# Patient Record
Sex: Female | Born: 1955
Health system: Southern US, Community
[De-identification: ages and names within clinical notes are randomized; demographics above are authoritative.]

## PROBLEM LIST (undated history)

## (undated) DIAGNOSIS — M75100 Unspecified rotator cuff tear or rupture of unspecified shoulder, not specified as traumatic: Secondary | ICD-10-CM

## (undated) DIAGNOSIS — R4586 Emotional lability: Secondary | ICD-10-CM

## (undated) DIAGNOSIS — M1990 Unspecified osteoarthritis, unspecified site: Secondary | ICD-10-CM

## (undated) DIAGNOSIS — I10 Essential (primary) hypertension: Secondary | ICD-10-CM

## (undated) DIAGNOSIS — A599 Trichomoniasis, unspecified: Secondary | ICD-10-CM

## (undated) DIAGNOSIS — K029 Dental caries, unspecified: Secondary | ICD-10-CM

## (undated) DIAGNOSIS — F329 Major depressive disorder, single episode, unspecified: Secondary | ICD-10-CM

## (undated) DIAGNOSIS — M25552 Pain in left hip: Secondary | ICD-10-CM

## (undated) DIAGNOSIS — K430 Incisional hernia with obstruction, without gangrene: Secondary | ICD-10-CM

## (undated) DIAGNOSIS — M5030 Other cervical disc degeneration, unspecified cervical region: Secondary | ICD-10-CM

## (undated) DIAGNOSIS — J45909 Unspecified asthma, uncomplicated: Secondary | ICD-10-CM

## (undated) DIAGNOSIS — M47817 Spondylosis without myelopathy or radiculopathy, lumbosacral region: Secondary | ICD-10-CM

## (undated) DIAGNOSIS — M79609 Pain in unspecified limb: Secondary | ICD-10-CM

## (undated) DIAGNOSIS — N898 Other specified noninflammatory disorders of vagina: Secondary | ICD-10-CM

## (undated) DIAGNOSIS — R3129 Other microscopic hematuria: Secondary | ICD-10-CM

## (undated) DIAGNOSIS — Z8719 Personal history of other diseases of the digestive system: Secondary | ICD-10-CM

## (undated) DIAGNOSIS — M5430 Sciatica, unspecified side: Secondary | ICD-10-CM

## (undated) DIAGNOSIS — M940 Chondrocostal junction syndrome [Tietze]: Secondary | ICD-10-CM

## (undated) DIAGNOSIS — M5412 Radiculopathy, cervical region: Secondary | ICD-10-CM

## (undated) DIAGNOSIS — M479 Spondylosis, unspecified: Secondary | ICD-10-CM

## (undated) DIAGNOSIS — F17200 Nicotine dependence, unspecified, uncomplicated: Secondary | ICD-10-CM

## (undated) DIAGNOSIS — S92309A Fracture of unspecified metatarsal bone(s), unspecified foot, initial encounter for closed fracture: Secondary | ICD-10-CM

## (undated) DIAGNOSIS — M5126 Other intervertebral disc displacement, lumbar region: Secondary | ICD-10-CM

## (undated) DIAGNOSIS — F32A Depression, unspecified: Secondary | ICD-10-CM

## (undated) DIAGNOSIS — E785 Hyperlipidemia, unspecified: Secondary | ICD-10-CM

## (undated) DIAGNOSIS — M654 Radial styloid tenosynovitis [de Quervain]: Secondary | ICD-10-CM

## (undated) DIAGNOSIS — N200 Calculus of kidney: Secondary | ICD-10-CM

## (undated) DIAGNOSIS — M25539 Pain in unspecified wrist: Secondary | ICD-10-CM

## (undated) DIAGNOSIS — Z87442 Personal history of urinary calculi: Secondary | ICD-10-CM

## (undated) DIAGNOSIS — J309 Allergic rhinitis, unspecified: Secondary | ICD-10-CM

## (undated) DIAGNOSIS — E669 Obesity, unspecified: Secondary | ICD-10-CM

## (undated) DIAGNOSIS — Z981 Arthrodesis status: Secondary | ICD-10-CM

## (undated) DIAGNOSIS — R002 Palpitations: Secondary | ICD-10-CM

## (undated) HISTORY — DX: Dental caries, unspecified: K02.9

## (undated) HISTORY — DX: Chondrocostal junction syndrome (tietze): M94.0

## (undated) HISTORY — PX: HERNIA REPAIR: SHX51

## (undated) HISTORY — PX: TONSILLECTOMY: SUR1361

## (undated) HISTORY — PX: DILATION AND CURETTAGE OF UTERUS: SHX78

## (undated) HISTORY — DX: Unspecified asthma, uncomplicated: J45.909

## (undated) HISTORY — DX: Essential (primary) hypertension: I10

## (undated) HISTORY — DX: Other intervertebral disc displacement, lumbar region: M51.26

## (undated) HISTORY — DX: Hyperlipidemia, unspecified: E78.5

## (undated) HISTORY — DX: Fracture of unspecified metatarsal bone(s), unspecified foot, initial encounter for closed fracture: S92.309A

## (undated) HISTORY — PX: MULTIPLE TOOTH EXTRACTIONS: SHX2053

## (undated) HISTORY — DX: Calculus of kidney: N20.0

## (undated) HISTORY — DX: Unspecified osteoarthritis, unspecified site: M19.90

---
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
## 1977-03-31 HISTORY — PX: TUBAL LIGATION: SHX77

## 1987-04-01 HISTORY — PX: BLADDER REPAIR: SHX76

## 1987-04-01 HISTORY — PX: SMALL INTESTINE SURGERY: SHX150

## 1987-04-01 HISTORY — PX: ABDOMINAL HYSTERECTOMY: SHX81

## 1997-12-01 ENCOUNTER — Emergency Department (HOSPITAL_COMMUNITY): Admission: EM | Admit: 1997-12-01 | Discharge: 1997-12-01 | Payer: Self-pay | Admitting: Emergency Medicine

## 1997-12-01 ENCOUNTER — Encounter: Payer: Self-pay | Admitting: Emergency Medicine

## 1998-07-20 ENCOUNTER — Emergency Department (HOSPITAL_COMMUNITY): Admission: EM | Admit: 1998-07-20 | Discharge: 1998-07-20 | Payer: Self-pay | Admitting: Emergency Medicine

## 1998-08-09 ENCOUNTER — Ambulatory Visit (HOSPITAL_COMMUNITY): Admission: RE | Admit: 1998-08-09 | Discharge: 1998-08-10 | Payer: Self-pay | Admitting: Surgery

## 1999-07-31 ENCOUNTER — Emergency Department (HOSPITAL_COMMUNITY): Admission: EM | Admit: 1999-07-31 | Discharge: 1999-07-31 | Payer: Self-pay | Admitting: Emergency Medicine

## 1999-10-24 ENCOUNTER — Encounter: Payer: Self-pay | Admitting: Emergency Medicine

## 1999-10-24 ENCOUNTER — Emergency Department (HOSPITAL_COMMUNITY): Admission: EM | Admit: 1999-10-24 | Discharge: 1999-10-24 | Payer: Self-pay | Admitting: Emergency Medicine

## 1999-10-31 ENCOUNTER — Emergency Department (HOSPITAL_COMMUNITY): Admission: EM | Admit: 1999-10-31 | Discharge: 1999-10-31 | Payer: Self-pay | Admitting: Emergency Medicine

## 1999-11-11 ENCOUNTER — Encounter: Admission: RE | Admit: 1999-11-11 | Discharge: 1999-11-11 | Payer: Self-pay | Admitting: Internal Medicine

## 2001-10-06 ENCOUNTER — Emergency Department (HOSPITAL_COMMUNITY): Admission: EM | Admit: 2001-10-06 | Discharge: 2001-10-06 | Payer: Self-pay

## 2001-12-06 ENCOUNTER — Emergency Department (HOSPITAL_COMMUNITY): Admission: EM | Admit: 2001-12-06 | Discharge: 2001-12-06 | Payer: Self-pay | Admitting: Emergency Medicine

## 2003-04-01 HISTORY — PX: UMBILICAL HERNIA REPAIR: SHX196

## 2003-07-03 ENCOUNTER — Emergency Department (HOSPITAL_COMMUNITY): Admission: EM | Admit: 2003-07-03 | Discharge: 2003-07-03 | Payer: Self-pay

## 2003-07-04 ENCOUNTER — Ambulatory Visit (HOSPITAL_COMMUNITY): Admission: EM | Admit: 2003-07-04 | Discharge: 2003-07-04 | Payer: Self-pay | Admitting: Emergency Medicine

## 2003-09-08 ENCOUNTER — Emergency Department (HOSPITAL_COMMUNITY): Admission: EM | Admit: 2003-09-08 | Discharge: 2003-09-08 | Payer: Self-pay | Admitting: Emergency Medicine

## 2005-03-31 HISTORY — PX: VENTRAL HERNIA REPAIR: SHX424

## 2005-08-11 ENCOUNTER — Emergency Department (HOSPITAL_COMMUNITY): Admission: EM | Admit: 2005-08-11 | Discharge: 2005-08-11 | Payer: Self-pay | Admitting: Family Medicine

## 2005-08-14 ENCOUNTER — Ambulatory Visit (HOSPITAL_BASED_OUTPATIENT_CLINIC_OR_DEPARTMENT_OTHER): Admission: RE | Admit: 2005-08-14 | Discharge: 2005-08-14 | Payer: Self-pay | Admitting: General Surgery

## 2006-09-11 ENCOUNTER — Emergency Department (HOSPITAL_COMMUNITY): Admission: EM | Admit: 2006-09-11 | Discharge: 2006-09-11 | Payer: Self-pay | Admitting: Family Medicine

## 2006-10-12 ENCOUNTER — Emergency Department (HOSPITAL_COMMUNITY): Admission: EM | Admit: 2006-10-12 | Discharge: 2006-10-12 | Payer: Self-pay | Admitting: Family Medicine

## 2007-02-18 ENCOUNTER — Emergency Department (HOSPITAL_COMMUNITY): Admission: EM | Admit: 2007-02-18 | Discharge: 2007-02-18 | Payer: Self-pay | Admitting: Emergency Medicine

## 2007-03-16 ENCOUNTER — Ambulatory Visit: Payer: Self-pay | Admitting: Nurse Practitioner

## 2007-04-01 DIAGNOSIS — J45909 Unspecified asthma, uncomplicated: Secondary | ICD-10-CM

## 2007-04-01 DIAGNOSIS — I10 Essential (primary) hypertension: Secondary | ICD-10-CM

## 2007-04-01 HISTORY — DX: Essential (primary) hypertension: I10

## 2007-04-01 HISTORY — DX: Unspecified asthma, uncomplicated: J45.909

## 2007-04-06 ENCOUNTER — Telehealth (INDEPENDENT_AMBULATORY_CARE_PROVIDER_SITE_OTHER): Payer: Self-pay | Admitting: Nurse Practitioner

## 2007-04-15 ENCOUNTER — Ambulatory Visit: Payer: Self-pay | Admitting: Nurse Practitioner

## 2007-04-15 DIAGNOSIS — M79609 Pain in unspecified limb: Secondary | ICD-10-CM

## 2007-04-15 HISTORY — DX: Pain in unspecified limb: M79.609

## 2007-04-16 ENCOUNTER — Telehealth (INDEPENDENT_AMBULATORY_CARE_PROVIDER_SITE_OTHER): Payer: Self-pay | Admitting: Nurse Practitioner

## 2007-04-22 ENCOUNTER — Ambulatory Visit: Payer: Self-pay | Admitting: Nurse Practitioner

## 2007-04-22 DIAGNOSIS — A599 Trichomoniasis, unspecified: Secondary | ICD-10-CM

## 2007-04-22 DIAGNOSIS — R3129 Other microscopic hematuria: Secondary | ICD-10-CM | POA: Insufficient documentation

## 2007-04-22 HISTORY — DX: Other microscopic hematuria: R31.29

## 2007-04-22 HISTORY — DX: Trichomoniasis, unspecified: A59.9

## 2007-04-22 LAB — CONVERTED CEMR LAB
Bilirubin Urine: NEGATIVE
Glucose, Urine, Semiquant: NEGATIVE
Nitrite: NEGATIVE
Pap Smear: NEGATIVE
WBC Urine, dipstick: NEGATIVE
pH: 7.5

## 2007-04-23 ENCOUNTER — Encounter (INDEPENDENT_AMBULATORY_CARE_PROVIDER_SITE_OTHER): Payer: Self-pay | Admitting: Nurse Practitioner

## 2007-04-23 LAB — CONVERTED CEMR LAB
Hep B C IgM: NEGATIVE
Hepatitis B Surface Ag: NEGATIVE

## 2007-04-26 ENCOUNTER — Ambulatory Visit (HOSPITAL_COMMUNITY): Admission: RE | Admit: 2007-04-26 | Discharge: 2007-04-26 | Payer: Self-pay | Admitting: Internal Medicine

## 2007-04-29 ENCOUNTER — Encounter (INDEPENDENT_AMBULATORY_CARE_PROVIDER_SITE_OTHER): Payer: Self-pay | Admitting: Internal Medicine

## 2007-04-30 ENCOUNTER — Encounter (INDEPENDENT_AMBULATORY_CARE_PROVIDER_SITE_OTHER): Payer: Self-pay | Admitting: Nurse Practitioner

## 2007-04-30 LAB — CONVERTED CEMR LAB
AST: 61 units/L — ABNORMAL HIGH (ref 0–37)
Albumin: 3.9 g/dL (ref 3.5–5.2)
Alkaline Phosphatase: 113 units/L (ref 39–117)
BUN: 14 mg/dL (ref 6–23)
Basophils Absolute: 0 10*3/uL (ref 0.0–0.1)
Basophils Relative: 0 % (ref 0–1)
CO2: 24 meq/L (ref 19–32)
Calcium: 9.1 mg/dL (ref 8.4–10.5)
Eosinophils Absolute: 0.2 10*3/uL (ref 0.0–0.7)
GC Probe Amp, Genital: NEGATIVE
Hemoglobin: 13.4 g/dL (ref 12.0–15.0)
Lymphocytes Relative: 43 % (ref 12–46)
MCHC: 32.8 g/dL (ref 30.0–36.0)
Monocytes Absolute: 0.5 10*3/uL (ref 0.1–1.0)
Monocytes Relative: 7 % (ref 3–12)
Neutro Abs: 3.2 10*3/uL (ref 1.7–7.7)
RDW: 13 % (ref 11.5–15.5)
TSH: 1.575 microintl units/mL (ref 0.350–5.50)
Total Bilirubin: 0.4 mg/dL (ref 0.3–1.2)
Total Protein: 6.8 g/dL (ref 6.0–8.3)

## 2007-05-07 ENCOUNTER — Ambulatory Visit: Payer: Self-pay | Admitting: Nurse Practitioner

## 2007-05-07 LAB — CONVERTED CEMR LAB
Blood in Urine, dipstick: NEGATIVE
Glucose, Urine, Semiquant: NEGATIVE
HDL: 46 mg/dL (ref >39)
LDL Cholesterol: 114 mg/dL — ABNORMAL HIGH (ref 0–99)
Protein, U semiquant: NEGATIVE
Total CHOL/HDL Ratio: 4.1
Urobilinogen, UA: NEGATIVE
VLDL: 27 mg/dL (ref 0–40)
WBC Urine, dipstick: NEGATIVE
pH: 6

## 2007-05-11 ENCOUNTER — Telehealth (INDEPENDENT_AMBULATORY_CARE_PROVIDER_SITE_OTHER): Payer: Self-pay | Admitting: Internal Medicine

## 2007-05-11 ENCOUNTER — Ambulatory Visit (HOSPITAL_COMMUNITY): Admission: RE | Admit: 2007-05-11 | Discharge: 2007-05-11 | Payer: Self-pay | Admitting: Internal Medicine

## 2007-05-21 ENCOUNTER — Ambulatory Visit: Payer: Self-pay | Admitting: Nurse Practitioner

## 2007-05-21 DIAGNOSIS — K430 Incisional hernia with obstruction, without gangrene: Secondary | ICD-10-CM

## 2007-05-21 DIAGNOSIS — N200 Calculus of kidney: Secondary | ICD-10-CM

## 2007-05-21 HISTORY — DX: Calculus of kidney: N20.0

## 2007-05-21 HISTORY — DX: Incisional hernia with obstruction, without gangrene: K43.0

## 2007-06-03 ENCOUNTER — Ambulatory Visit: Payer: Self-pay | Admitting: Nurse Practitioner

## 2007-09-27 ENCOUNTER — Telehealth (INDEPENDENT_AMBULATORY_CARE_PROVIDER_SITE_OTHER): Payer: Self-pay | Admitting: *Deleted

## 2007-10-11 ENCOUNTER — Ambulatory Visit: Payer: Self-pay | Admitting: *Deleted

## 2007-10-14 ENCOUNTER — Ambulatory Visit: Payer: Self-pay | Admitting: Nurse Practitioner

## 2007-10-18 ENCOUNTER — Ambulatory Visit (HOSPITAL_COMMUNITY): Admission: RE | Admit: 2007-10-18 | Discharge: 2007-10-18 | Payer: Self-pay | Admitting: Nurse Practitioner

## 2007-10-18 DIAGNOSIS — M5030 Other cervical disc degeneration, unspecified cervical region: Secondary | ICD-10-CM

## 2007-10-18 HISTORY — DX: Other cervical disc degeneration, unspecified cervical region: M50.30

## 2007-10-29 ENCOUNTER — Ambulatory Visit: Payer: Self-pay | Admitting: Nurse Practitioner

## 2007-10-30 ENCOUNTER — Ambulatory Visit (HOSPITAL_COMMUNITY): Admission: RE | Admit: 2007-10-30 | Discharge: 2007-10-30 | Payer: Self-pay | Admitting: Internal Medicine

## 2007-10-30 DIAGNOSIS — M75100 Unspecified rotator cuff tear or rupture of unspecified shoulder, not specified as traumatic: Secondary | ICD-10-CM

## 2007-10-30 DIAGNOSIS — IMO0002 Reserved for concepts with insufficient information to code with codable children: Secondary | ICD-10-CM

## 2007-10-30 HISTORY — DX: Unspecified rotator cuff tear or rupture of unspecified shoulder, not specified as traumatic: M75.100

## 2007-10-30 HISTORY — DX: Reserved for concepts with insufficient information to code with codable children: IMO0002

## 2007-11-11 ENCOUNTER — Ambulatory Visit: Payer: Self-pay | Admitting: Nurse Practitioner

## 2007-11-23 ENCOUNTER — Encounter: Admission: RE | Admit: 2007-11-23 | Discharge: 2007-12-24 | Payer: Self-pay | Admitting: Nurse Practitioner

## 2007-11-23 ENCOUNTER — Encounter (INDEPENDENT_AMBULATORY_CARE_PROVIDER_SITE_OTHER): Payer: Self-pay | Admitting: Nurse Practitioner

## 2007-11-24 ENCOUNTER — Telehealth (INDEPENDENT_AMBULATORY_CARE_PROVIDER_SITE_OTHER): Payer: Self-pay | Admitting: Nurse Practitioner

## 2007-11-26 ENCOUNTER — Telehealth (INDEPENDENT_AMBULATORY_CARE_PROVIDER_SITE_OTHER): Payer: Self-pay | Admitting: Nurse Practitioner

## 2007-11-26 ENCOUNTER — Encounter (INDEPENDENT_AMBULATORY_CARE_PROVIDER_SITE_OTHER): Payer: Self-pay | Admitting: Nurse Practitioner

## 2007-12-01 ENCOUNTER — Emergency Department (HOSPITAL_COMMUNITY): Admission: EM | Admit: 2007-12-01 | Discharge: 2007-12-01 | Payer: Self-pay | Admitting: Family Medicine

## 2007-12-02 ENCOUNTER — Telehealth (INDEPENDENT_AMBULATORY_CARE_PROVIDER_SITE_OTHER): Payer: Self-pay | Admitting: Nurse Practitioner

## 2007-12-03 ENCOUNTER — Encounter (INDEPENDENT_AMBULATORY_CARE_PROVIDER_SITE_OTHER): Payer: Self-pay | Admitting: *Deleted

## 2007-12-03 ENCOUNTER — Emergency Department (HOSPITAL_COMMUNITY): Admission: EM | Admit: 2007-12-03 | Discharge: 2007-12-03 | Payer: Self-pay | Admitting: Family Medicine

## 2007-12-07 ENCOUNTER — Emergency Department (HOSPITAL_COMMUNITY): Admission: EM | Admit: 2007-12-07 | Discharge: 2007-12-07 | Payer: Self-pay | Admitting: Emergency Medicine

## 2007-12-09 ENCOUNTER — Emergency Department (HOSPITAL_COMMUNITY): Admission: EM | Admit: 2007-12-09 | Discharge: 2007-12-09 | Payer: Self-pay | Admitting: Emergency Medicine

## 2007-12-27 ENCOUNTER — Encounter (INDEPENDENT_AMBULATORY_CARE_PROVIDER_SITE_OTHER): Payer: Self-pay | Admitting: *Deleted

## 2008-01-20 ENCOUNTER — Telehealth (INDEPENDENT_AMBULATORY_CARE_PROVIDER_SITE_OTHER): Payer: Self-pay | Admitting: Nurse Practitioner

## 2008-02-04 ENCOUNTER — Ambulatory Visit: Payer: Self-pay | Admitting: Nurse Practitioner

## 2008-02-04 DIAGNOSIS — K029 Dental caries, unspecified: Secondary | ICD-10-CM

## 2008-02-04 HISTORY — DX: Dental caries, unspecified: K02.9

## 2008-02-08 ENCOUNTER — Ambulatory Visit (HOSPITAL_COMMUNITY): Admission: RE | Admit: 2008-02-08 | Discharge: 2008-02-08 | Payer: Self-pay | Admitting: Internal Medicine

## 2008-02-09 ENCOUNTER — Encounter (INDEPENDENT_AMBULATORY_CARE_PROVIDER_SITE_OTHER): Payer: Self-pay | Admitting: Nurse Practitioner

## 2008-02-09 DIAGNOSIS — M479 Spondylosis, unspecified: Secondary | ICD-10-CM | POA: Insufficient documentation

## 2008-02-09 HISTORY — DX: Spondylosis, unspecified: M47.9

## 2008-03-20 ENCOUNTER — Telehealth (INDEPENDENT_AMBULATORY_CARE_PROVIDER_SITE_OTHER): Payer: Self-pay | Admitting: *Deleted

## 2008-04-12 ENCOUNTER — Telehealth (INDEPENDENT_AMBULATORY_CARE_PROVIDER_SITE_OTHER): Payer: Self-pay | Admitting: Nurse Practitioner

## 2008-04-28 ENCOUNTER — Encounter (INDEPENDENT_AMBULATORY_CARE_PROVIDER_SITE_OTHER): Payer: Self-pay | Admitting: Nurse Practitioner

## 2008-05-02 ENCOUNTER — Ambulatory Visit (HOSPITAL_COMMUNITY): Admission: RE | Admit: 2008-05-02 | Discharge: 2008-05-02 | Payer: Self-pay | Admitting: Internal Medicine

## 2008-05-02 ENCOUNTER — Telehealth (INDEPENDENT_AMBULATORY_CARE_PROVIDER_SITE_OTHER): Payer: Self-pay | Admitting: Nurse Practitioner

## 2008-05-02 DIAGNOSIS — M5126 Other intervertebral disc displacement, lumbar region: Secondary | ICD-10-CM

## 2008-05-02 HISTORY — DX: Other intervertebral disc displacement, lumbar region: M51.26

## 2008-05-08 ENCOUNTER — Encounter (INDEPENDENT_AMBULATORY_CARE_PROVIDER_SITE_OTHER): Payer: Self-pay | Admitting: Nurse Practitioner

## 2008-05-09 ENCOUNTER — Encounter (INDEPENDENT_AMBULATORY_CARE_PROVIDER_SITE_OTHER): Payer: Self-pay | Admitting: Nurse Practitioner

## 2008-05-09 ENCOUNTER — Telehealth (INDEPENDENT_AMBULATORY_CARE_PROVIDER_SITE_OTHER): Payer: Self-pay | Admitting: Nurse Practitioner

## 2008-05-16 ENCOUNTER — Telehealth (INDEPENDENT_AMBULATORY_CARE_PROVIDER_SITE_OTHER): Payer: Self-pay | Admitting: Nurse Practitioner

## 2008-06-01 ENCOUNTER — Encounter (INDEPENDENT_AMBULATORY_CARE_PROVIDER_SITE_OTHER): Payer: Self-pay | Admitting: Nurse Practitioner

## 2008-06-02 ENCOUNTER — Encounter (INDEPENDENT_AMBULATORY_CARE_PROVIDER_SITE_OTHER): Payer: Self-pay | Admitting: Nurse Practitioner

## 2008-06-12 ENCOUNTER — Encounter (INDEPENDENT_AMBULATORY_CARE_PROVIDER_SITE_OTHER): Payer: Self-pay | Admitting: Nurse Practitioner

## 2008-06-22 ENCOUNTER — Telehealth (INDEPENDENT_AMBULATORY_CARE_PROVIDER_SITE_OTHER): Payer: Self-pay | Admitting: Nurse Practitioner

## 2008-07-03 ENCOUNTER — Encounter (INDEPENDENT_AMBULATORY_CARE_PROVIDER_SITE_OTHER): Payer: Self-pay | Admitting: Nurse Practitioner

## 2008-07-10 ENCOUNTER — Encounter (INDEPENDENT_AMBULATORY_CARE_PROVIDER_SITE_OTHER): Payer: Self-pay | Admitting: Nurse Practitioner

## 2008-07-27 ENCOUNTER — Telehealth (INDEPENDENT_AMBULATORY_CARE_PROVIDER_SITE_OTHER): Payer: Self-pay | Admitting: Nurse Practitioner

## 2008-08-25 ENCOUNTER — Encounter (INDEPENDENT_AMBULATORY_CARE_PROVIDER_SITE_OTHER): Payer: Self-pay | Admitting: Nurse Practitioner

## 2008-09-01 ENCOUNTER — Ambulatory Visit: Payer: Self-pay | Admitting: *Deleted

## 2008-09-01 ENCOUNTER — Observation Stay (HOSPITAL_COMMUNITY): Admission: EM | Admit: 2008-09-01 | Discharge: 2008-09-02 | Payer: Self-pay | Admitting: Emergency Medicine

## 2008-09-01 LAB — CONVERTED CEMR LAB
CO2: 22 meq/L
Chloride: 106 meq/L
RBC: 3.93 M/uL
Total Protein: 6.7 g/dL

## 2008-09-04 ENCOUNTER — Ambulatory Visit: Payer: Self-pay | Admitting: Nurse Practitioner

## 2008-09-04 DIAGNOSIS — R079 Chest pain, unspecified: Secondary | ICD-10-CM | POA: Insufficient documentation

## 2008-09-04 DIAGNOSIS — K5900 Constipation, unspecified: Secondary | ICD-10-CM | POA: Insufficient documentation

## 2008-09-04 DIAGNOSIS — M940 Chondrocostal junction syndrome [Tietze]: Secondary | ICD-10-CM

## 2008-09-04 HISTORY — DX: Chondrocostal junction syndrome (tietze): M94.0

## 2008-09-07 ENCOUNTER — Encounter (INDEPENDENT_AMBULATORY_CARE_PROVIDER_SITE_OTHER): Payer: Self-pay | Admitting: Nurse Practitioner

## 2008-09-11 ENCOUNTER — Encounter (INDEPENDENT_AMBULATORY_CARE_PROVIDER_SITE_OTHER): Payer: Self-pay | Admitting: Nurse Practitioner

## 2008-09-15 ENCOUNTER — Telehealth (INDEPENDENT_AMBULATORY_CARE_PROVIDER_SITE_OTHER): Payer: Self-pay | Admitting: Nurse Practitioner

## 2008-10-16 ENCOUNTER — Telehealth (INDEPENDENT_AMBULATORY_CARE_PROVIDER_SITE_OTHER): Payer: Self-pay | Admitting: Nurse Practitioner

## 2008-10-31 ENCOUNTER — Encounter (INDEPENDENT_AMBULATORY_CARE_PROVIDER_SITE_OTHER): Payer: Self-pay | Admitting: Nurse Practitioner

## 2008-11-20 ENCOUNTER — Telehealth (INDEPENDENT_AMBULATORY_CARE_PROVIDER_SITE_OTHER): Payer: Self-pay | Admitting: Nurse Practitioner

## 2008-11-27 ENCOUNTER — Telehealth (INDEPENDENT_AMBULATORY_CARE_PROVIDER_SITE_OTHER): Payer: Self-pay | Admitting: Nurse Practitioner

## 2008-12-07 ENCOUNTER — Telehealth (INDEPENDENT_AMBULATORY_CARE_PROVIDER_SITE_OTHER): Payer: Self-pay | Admitting: Nurse Practitioner

## 2008-12-29 ENCOUNTER — Ambulatory Visit: Payer: Self-pay | Admitting: Nurse Practitioner

## 2008-12-29 DIAGNOSIS — S92309A Fracture of unspecified metatarsal bone(s), unspecified foot, initial encounter for closed fracture: Secondary | ICD-10-CM | POA: Insufficient documentation

## 2008-12-29 HISTORY — DX: Fracture of unspecified metatarsal bone(s), unspecified foot, initial encounter for closed fracture: S92.309A

## 2008-12-29 LAB — CONVERTED CEMR LAB
ALT: 12 units/L (ref 0–35)
AST: 11 units/L (ref 0–37)
BUN: 19 mg/dL (ref 6–23)
Basophils Absolute: 0 10*3/uL (ref 0.0–0.1)
CO2: 22 meq/L (ref 19–32)
Calcium: 9.2 mg/dL (ref 8.4–10.5)
Creatinine, Ser: 0.72 mg/dL (ref 0.40–1.20)
Eosinophils Absolute: 0.2 10*3/uL (ref 0.0–0.7)
Eosinophils Relative: 3 % (ref 0–5)
Glucose, Bld: 73 mg/dL (ref 70–99)
HCT: 40.8 % (ref 36.0–46.0)
Hemoglobin: 13.1 g/dL (ref 12.0–15.0)
Lymphocytes Relative: 45 % (ref 12–46)
Lymphs Abs: 3.3 10*3/uL (ref 0.7–4.0)
Monocytes Absolute: 0.5 10*3/uL (ref 0.1–1.0)
Neutro Abs: 3.2 10*3/uL (ref 1.7–7.7)
Platelets: 404 10*3/uL — ABNORMAL HIGH (ref 150–400)
Potassium: 3.9 meq/L (ref 3.5–5.3)
RBC: 3.98 M/uL (ref 3.87–5.11)
RDW: 12.8 % (ref 11.5–15.5)
Total Bilirubin: 0.3 mg/dL (ref 0.3–1.2)
Total Protein: 6.1 g/dL (ref 6.0–8.3)

## 2008-12-30 ENCOUNTER — Emergency Department (HOSPITAL_COMMUNITY): Admission: EM | Admit: 2008-12-30 | Discharge: 2008-12-30 | Payer: Self-pay | Admitting: Family Medicine

## 2009-01-01 ENCOUNTER — Telehealth (INDEPENDENT_AMBULATORY_CARE_PROVIDER_SITE_OTHER): Payer: Self-pay | Admitting: Nurse Practitioner

## 2009-01-01 ENCOUNTER — Encounter (INDEPENDENT_AMBULATORY_CARE_PROVIDER_SITE_OTHER): Payer: Self-pay | Admitting: Nurse Practitioner

## 2009-01-05 ENCOUNTER — Ambulatory Visit: Payer: Self-pay | Admitting: Nurse Practitioner

## 2009-01-08 ENCOUNTER — Telehealth (INDEPENDENT_AMBULATORY_CARE_PROVIDER_SITE_OTHER): Payer: Self-pay | Admitting: *Deleted

## 2009-01-10 ENCOUNTER — Encounter (INDEPENDENT_AMBULATORY_CARE_PROVIDER_SITE_OTHER): Payer: Self-pay | Admitting: Nurse Practitioner

## 2009-01-19 ENCOUNTER — Encounter (INDEPENDENT_AMBULATORY_CARE_PROVIDER_SITE_OTHER): Payer: Self-pay | Admitting: Nurse Practitioner

## 2009-01-29 ENCOUNTER — Telehealth (INDEPENDENT_AMBULATORY_CARE_PROVIDER_SITE_OTHER): Payer: Self-pay | Admitting: Nurse Practitioner

## 2009-01-29 ENCOUNTER — Ambulatory Visit (HOSPITAL_COMMUNITY): Admission: RE | Admit: 2009-01-29 | Discharge: 2009-01-29 | Payer: Self-pay | Admitting: Nurse Practitioner

## 2009-01-29 ENCOUNTER — Ambulatory Visit: Payer: Self-pay | Admitting: Nurse Practitioner

## 2009-01-30 ENCOUNTER — Telehealth (INDEPENDENT_AMBULATORY_CARE_PROVIDER_SITE_OTHER): Payer: Self-pay | Admitting: Nurse Practitioner

## 2009-01-30 ENCOUNTER — Encounter: Admission: RE | Admit: 2009-01-30 | Discharge: 2009-02-20 | Payer: Self-pay | Admitting: Nurse Practitioner

## 2009-02-01 ENCOUNTER — Encounter (INDEPENDENT_AMBULATORY_CARE_PROVIDER_SITE_OTHER): Payer: Self-pay | Admitting: Nurse Practitioner

## 2009-02-04 IMAGING — CR DG CERVICAL SPINE COMPLETE 4+V
8 series · 8 of 8 positions shown · non-contrast
Comparison: 02/18/2007

CLINICAL DATA: Neck pain.

CERVICAL SPINE - COMPLETE 4+ VIEW

[w c-spine lat]
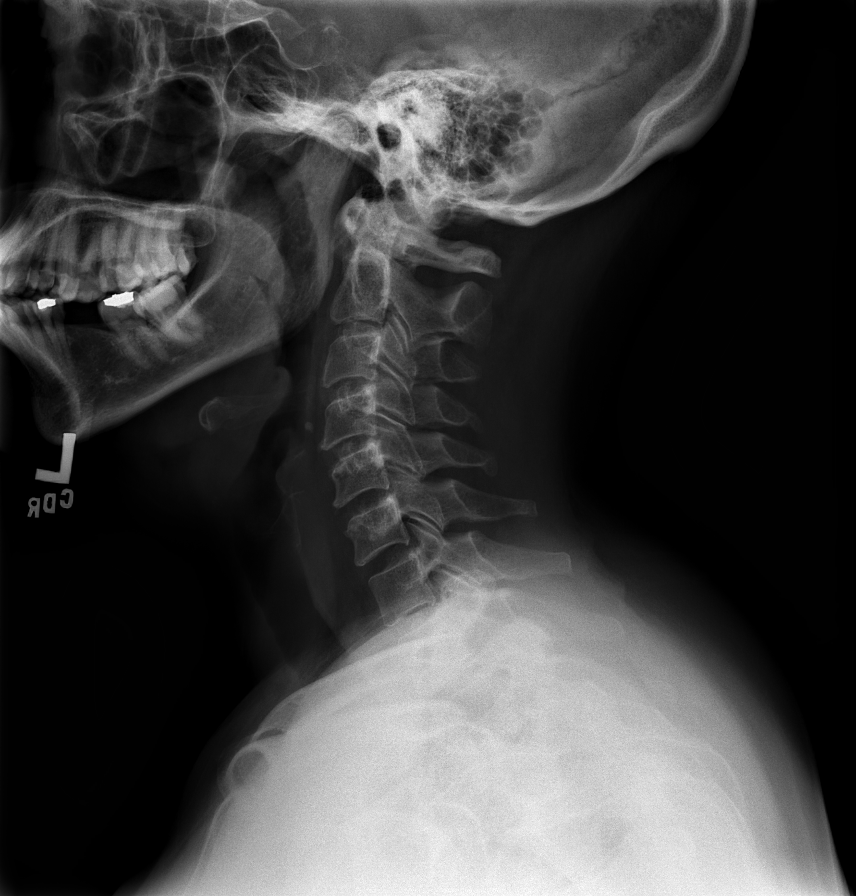

[w c-spine oblique (1 of 2)]
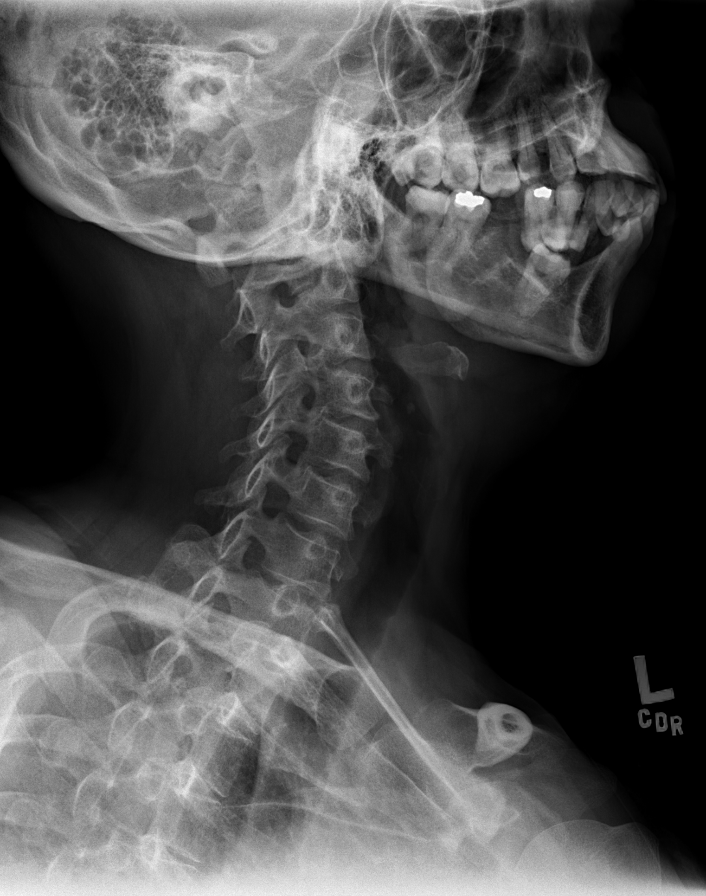

[w c-spine oblique (2 of 2)]
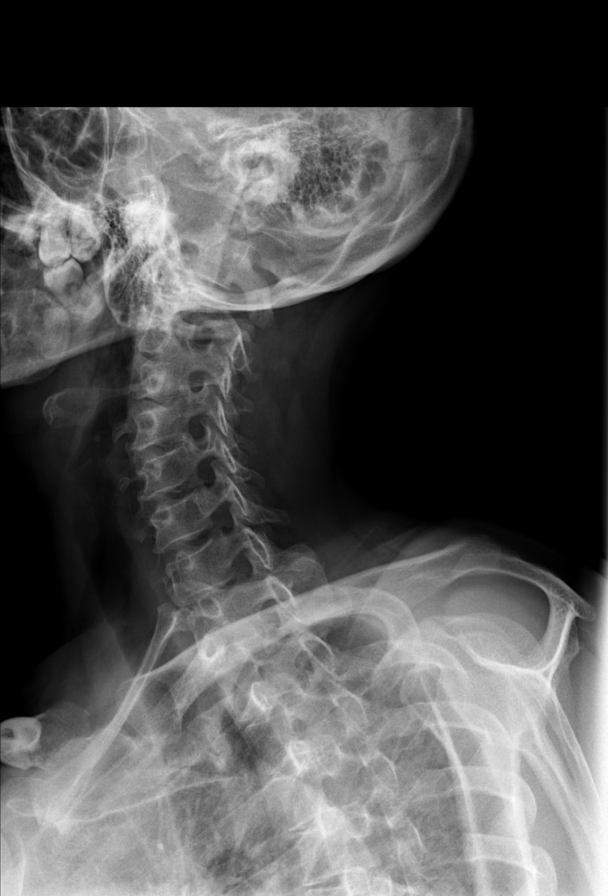

[w c-spine a.p.]
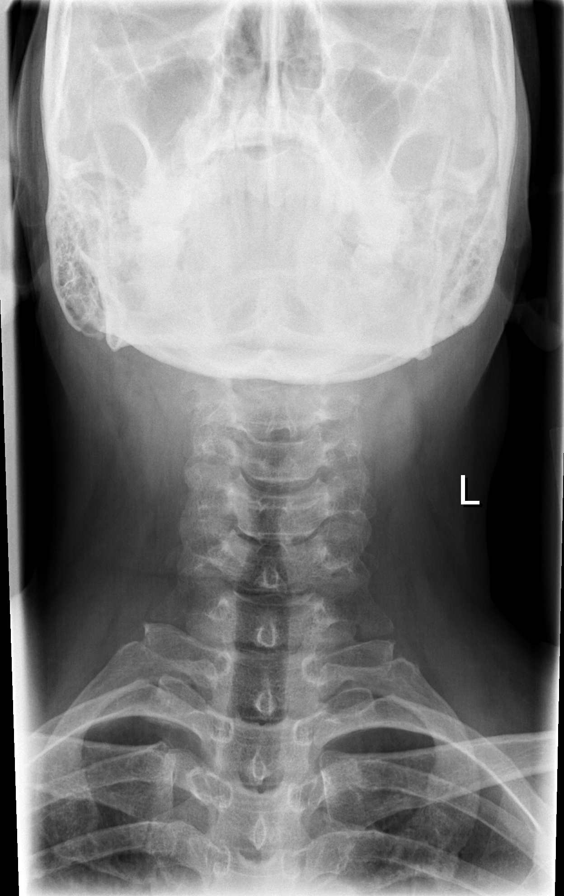

[w c-spine odontoid (1 of 3)]
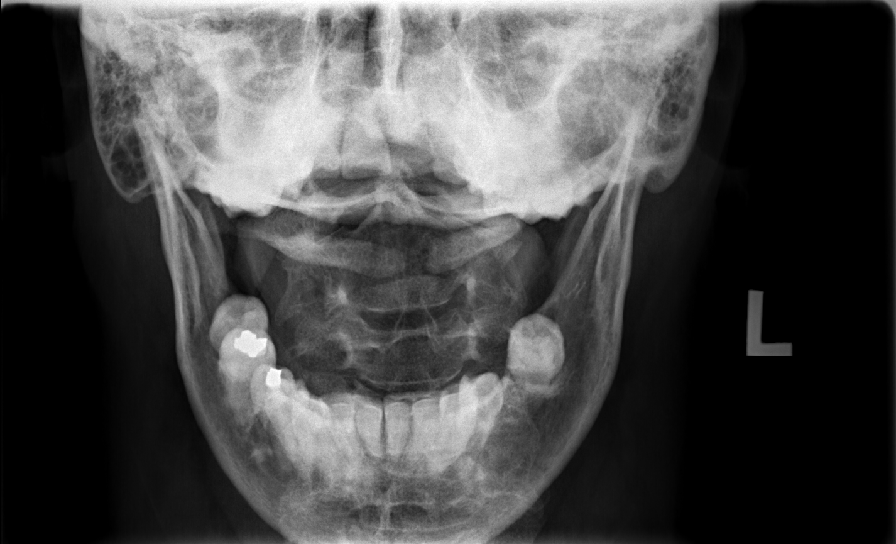

[w c-spine odontoid (2 of 3)]
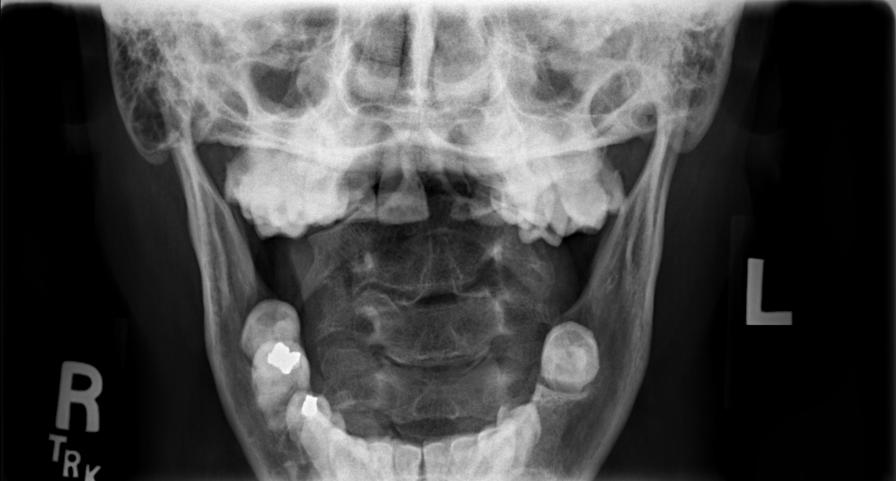

[w c-spine odontoid (3 of 3)]
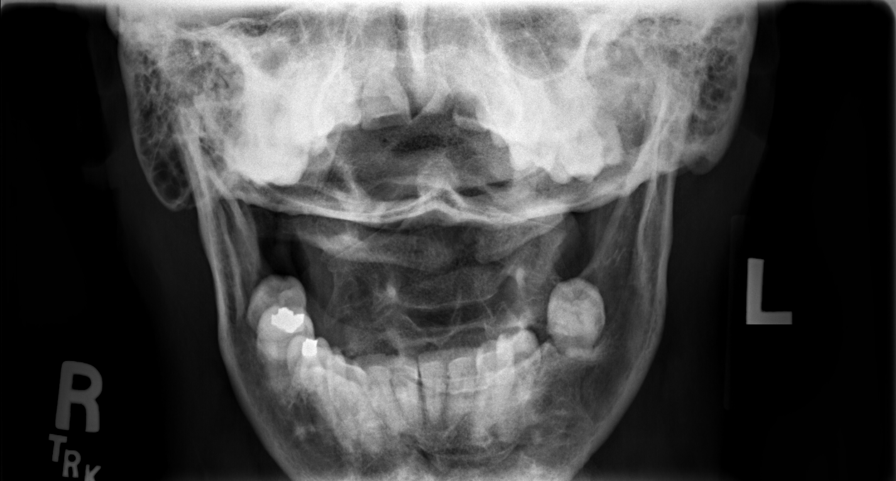

[t c-spine odontoid *]
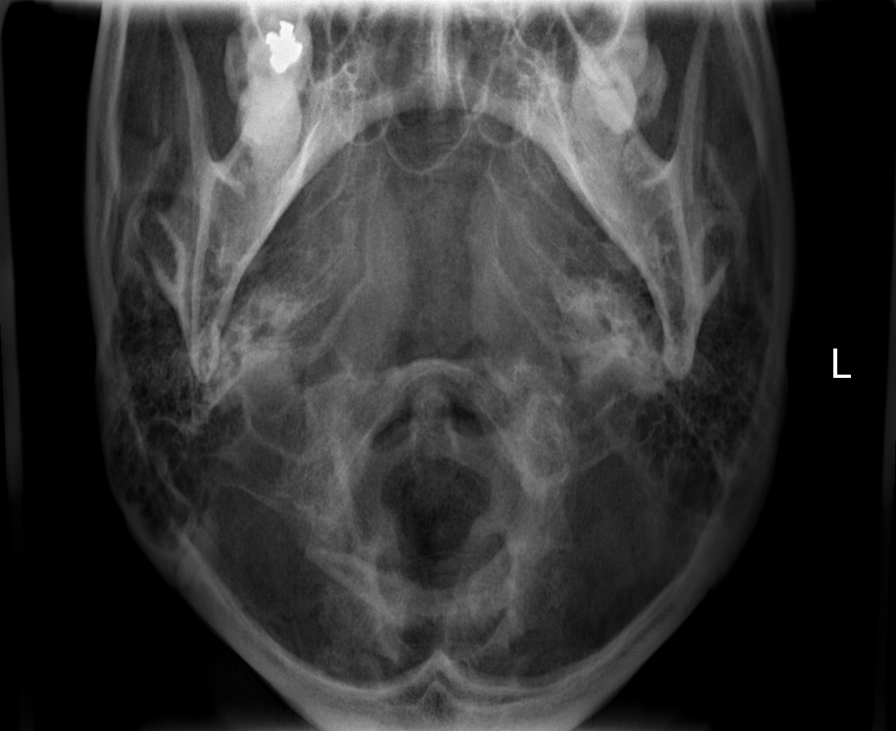

[8 of 8 positions shown; findings below may reference images not displayed]

FINDINGS: Normal alignment.  There is no fracture or mass lesion.
There is mild disc degeneration at C4-5 and C5-6 with mild disc
space narrowing.  There is mild foraminal narrowing on the right at
C3-4, C4-5 due to mild spurring.  Neural foramen are all patent on
the left.
IMPRESSION: Mild cervical disc degeneration.  No acute bony abnormality.

## 2009-02-13 ENCOUNTER — Telehealth (INDEPENDENT_AMBULATORY_CARE_PROVIDER_SITE_OTHER): Payer: Self-pay | Admitting: Nurse Practitioner

## 2009-02-13 ENCOUNTER — Encounter (INDEPENDENT_AMBULATORY_CARE_PROVIDER_SITE_OTHER): Payer: Self-pay | Admitting: Nurse Practitioner

## 2009-02-20 ENCOUNTER — Encounter (INDEPENDENT_AMBULATORY_CARE_PROVIDER_SITE_OTHER): Payer: Self-pay | Admitting: Nurse Practitioner

## 2009-02-27 ENCOUNTER — Telehealth: Payer: Self-pay | Admitting: Physician Assistant

## 2009-04-02 ENCOUNTER — Telehealth (INDEPENDENT_AMBULATORY_CARE_PROVIDER_SITE_OTHER): Payer: Self-pay | Admitting: *Deleted

## 2009-04-04 ENCOUNTER — Encounter (INDEPENDENT_AMBULATORY_CARE_PROVIDER_SITE_OTHER): Payer: Self-pay | Admitting: Nurse Practitioner

## 2009-05-01 ENCOUNTER — Telehealth (INDEPENDENT_AMBULATORY_CARE_PROVIDER_SITE_OTHER): Payer: Self-pay | Admitting: Nurse Practitioner

## 2009-05-31 ENCOUNTER — Telehealth (INDEPENDENT_AMBULATORY_CARE_PROVIDER_SITE_OTHER): Payer: Self-pay | Admitting: Nurse Practitioner

## 2009-06-29 ENCOUNTER — Telehealth (INDEPENDENT_AMBULATORY_CARE_PROVIDER_SITE_OTHER): Payer: Self-pay | Admitting: Nurse Practitioner

## 2009-07-04 ENCOUNTER — Ambulatory Visit: Payer: Self-pay | Admitting: Nurse Practitioner

## 2009-07-04 ENCOUNTER — Telehealth (INDEPENDENT_AMBULATORY_CARE_PROVIDER_SITE_OTHER): Payer: Self-pay | Admitting: Nurse Practitioner

## 2009-07-26 ENCOUNTER — Telehealth (INDEPENDENT_AMBULATORY_CARE_PROVIDER_SITE_OTHER): Payer: Self-pay | Admitting: Nurse Practitioner

## 2009-11-01 ENCOUNTER — Telehealth (INDEPENDENT_AMBULATORY_CARE_PROVIDER_SITE_OTHER): Payer: Self-pay | Admitting: Nurse Practitioner

## 2009-12-27 ENCOUNTER — Emergency Department (HOSPITAL_COMMUNITY): Admission: EM | Admit: 2009-12-27 | Discharge: 2009-12-27 | Payer: Self-pay | Admitting: Emergency Medicine

## 2010-01-04 ENCOUNTER — Telehealth (INDEPENDENT_AMBULATORY_CARE_PROVIDER_SITE_OTHER): Payer: Self-pay | Admitting: Nurse Practitioner

## 2010-01-09 ENCOUNTER — Ambulatory Visit: Payer: Self-pay | Admitting: Internal Medicine

## 2010-01-09 ENCOUNTER — Encounter (INDEPENDENT_AMBULATORY_CARE_PROVIDER_SITE_OTHER): Payer: Self-pay | Admitting: Nurse Practitioner

## 2010-01-09 DIAGNOSIS — M25539 Pain in unspecified wrist: Secondary | ICD-10-CM

## 2010-01-09 HISTORY — DX: Pain in unspecified wrist: M25.539

## 2010-01-09 LAB — CONVERTED CEMR LAB: Rapid HIV Screen: NEGATIVE

## 2010-01-10 ENCOUNTER — Telehealth (INDEPENDENT_AMBULATORY_CARE_PROVIDER_SITE_OTHER): Payer: Self-pay | Admitting: Nurse Practitioner

## 2010-01-10 LAB — CONVERTED CEMR LAB
ALT: 57 units/L — ABNORMAL HIGH (ref 0–35)
Basophils Relative: 0 % (ref 0–1)
CO2: 23 meq/L (ref 19–32)
Creatinine, Ser: 0.54 mg/dL (ref 0.40–1.20)
Eosinophils Absolute: 0.2 10*3/uL (ref 0.0–0.7)
Lymphocytes Relative: 30 % (ref 12–46)
Monocytes Absolute: 0.6 10*3/uL (ref 0.1–1.0)
Monocytes Relative: 7 % (ref 3–12)
Neutrophils Relative %: 60 % (ref 43–77)
Platelets: 427 10*3/uL — ABNORMAL HIGH (ref 150–400)
RBC: 4.16 M/uL (ref 3.87–5.11)
RDW: 13.1 % (ref 11.5–15.5)
Sodium: 140 meq/L (ref 135–145)
TSH: 1.319 microintl units/mL (ref 0.350–4.500)
Total Bilirubin: 0.3 mg/dL (ref 0.3–1.2)
Total Protein: 6.6 g/dL (ref 6.0–8.3)

## 2010-01-14 ENCOUNTER — Encounter (INDEPENDENT_AMBULATORY_CARE_PROVIDER_SITE_OTHER): Payer: Self-pay | Admitting: Nurse Practitioner

## 2010-01-14 LAB — CONVERTED CEMR LAB: HCV Ab: NEGATIVE

## 2010-01-18 ENCOUNTER — Encounter (INDEPENDENT_AMBULATORY_CARE_PROVIDER_SITE_OTHER): Payer: Self-pay | Admitting: Nurse Practitioner

## 2010-01-20 ENCOUNTER — Emergency Department (HOSPITAL_COMMUNITY): Admission: EM | Admit: 2010-01-20 | Discharge: 2010-01-20 | Payer: Self-pay | Admitting: Emergency Medicine

## 2010-02-19 ENCOUNTER — Ambulatory Visit: Payer: Self-pay | Admitting: Nurse Practitioner

## 2010-02-19 DIAGNOSIS — F329 Major depressive disorder, single episode, unspecified: Secondary | ICD-10-CM | POA: Insufficient documentation

## 2010-02-19 DIAGNOSIS — R7401 Elevation of levels of liver transaminase levels: Secondary | ICD-10-CM

## 2010-02-19 DIAGNOSIS — R740 Nonspecific elevation of levels of transaminase and lactic acid dehydrogenase [LDH]: Secondary | ICD-10-CM

## 2010-02-19 DIAGNOSIS — R7402 Elevation of levels of lactic acid dehydrogenase (LDH): Secondary | ICD-10-CM | POA: Insufficient documentation

## 2010-02-19 HISTORY — DX: Elevation of levels of lactic acid dehydrogenase (LDH): R74.02

## 2010-02-19 HISTORY — DX: Elevation of levels of liver transaminase levels: R74.01

## 2010-02-19 LAB — CONVERTED CEMR LAB
KOH Prep: NEGATIVE
Protein, U semiquant: NEGATIVE
Specific Gravity, Urine: 1.015
pH: 5.5

## 2010-02-20 LAB — CONVERTED CEMR LAB
ALT: 11 units/L (ref 0–35)
AST: 13 units/L (ref 0–37)
Alkaline Phosphatase: 81 units/L (ref 39–117)
Bilirubin, Direct: 0.1 mg/dL (ref 0.0–0.3)
GC Probe Amp, Genital: NEGATIVE
Hep A Total Ab: POSITIVE — AB
Hep B Core Total Ab: POSITIVE — AB
Hep B S Ab: NEGATIVE
Indirect Bilirubin: 0.3 mg/dL (ref 0.0–0.9)
Total Bilirubin: 0.4 mg/dL (ref 0.3–1.2)
Total Protein: 6.9 g/dL (ref 6.0–8.3)

## 2010-02-25 ENCOUNTER — Encounter (INDEPENDENT_AMBULATORY_CARE_PROVIDER_SITE_OTHER): Payer: Self-pay | Admitting: Nurse Practitioner

## 2010-02-25 LAB — CONVERTED CEMR LAB: Pap Smear: NEGATIVE

## 2010-03-04 ENCOUNTER — Telehealth (INDEPENDENT_AMBULATORY_CARE_PROVIDER_SITE_OTHER): Payer: Self-pay | Admitting: Nurse Practitioner

## 2010-03-11 ENCOUNTER — Ambulatory Visit: Payer: Self-pay | Admitting: Nurse Practitioner

## 2010-03-21 ENCOUNTER — Telehealth (INDEPENDENT_AMBULATORY_CARE_PROVIDER_SITE_OTHER): Payer: Self-pay | Admitting: Nurse Practitioner

## 2010-03-27 ENCOUNTER — Encounter (INDEPENDENT_AMBULATORY_CARE_PROVIDER_SITE_OTHER): Payer: Self-pay | Admitting: Nurse Practitioner

## 2010-03-27 ENCOUNTER — Ambulatory Visit (HOSPITAL_COMMUNITY)
Admission: RE | Admit: 2010-03-27 | Discharge: 2010-03-27 | Payer: Self-pay | Source: Home / Self Care | Attending: Internal Medicine | Admitting: Internal Medicine

## 2010-04-04 ENCOUNTER — Ambulatory Visit: Admit: 2010-04-04 | Payer: Self-pay | Admitting: Nurse Practitioner

## 2010-04-18 ENCOUNTER — Encounter (INDEPENDENT_AMBULATORY_CARE_PROVIDER_SITE_OTHER): Payer: Self-pay | Admitting: Nurse Practitioner

## 2010-04-18 ENCOUNTER — Telehealth (INDEPENDENT_AMBULATORY_CARE_PROVIDER_SITE_OTHER): Payer: Self-pay | Admitting: Nurse Practitioner

## 2010-04-21 ENCOUNTER — Encounter: Payer: Self-pay | Admitting: Internal Medicine

## 2010-04-22 ENCOUNTER — Ambulatory Visit (HOSPITAL_COMMUNITY)
Admission: RE | Admit: 2010-04-22 | Discharge: 2010-04-22 | Payer: Self-pay | Source: Home / Self Care | Attending: Internal Medicine | Admitting: Internal Medicine

## 2010-04-22 ENCOUNTER — Encounter: Payer: Self-pay | Admitting: Internal Medicine

## 2010-04-23 ENCOUNTER — Telehealth (INDEPENDENT_AMBULATORY_CARE_PROVIDER_SITE_OTHER): Payer: Self-pay | Admitting: Nurse Practitioner

## 2010-04-24 ENCOUNTER — Encounter (INDEPENDENT_AMBULATORY_CARE_PROVIDER_SITE_OTHER): Payer: Self-pay | Admitting: Nurse Practitioner

## 2010-04-30 NOTE — Progress Notes (Signed)
Summary: REFILL ON HER VICODEN  Phone Note Call from Patient Call back at Home Phone [phone]   Reason for Call: Refill Medication Summary of Call: MARTIN PT. MS Martine SAYS THAT SHE WILL NEED ANOTHER REFILL ON HER VICODEN. SHE USES [facility] ON [address]. Initial call taken by: Leodis Rains,  June 29, 2009 11:08 AM  Follow-up for Phone Call        forward to N. Daphine Deutscher, FNP Follow-up by: [name],  June 29, 2009 11:39 AM  Additional Follow-up for Phone Call Additional follow up Details #1::        Rx printed and put in basket fax to [facility] Additional Follow-up by: Lehman Prom FNP,  June 29, 2009 11:45 AM    Additional Follow-up for Phone Call Additional follow up Details #2::    Rx faxed to [facility] Follow-up by: [name],  July 02, 2009 4:01 PM  Prescriptions: VICODIN 5-500 MG TABS (HYDROCODONE-ACETAMINOPHEN) 1 tablet by mouth two times a day as needed for pain  #50 x 0   Entered and Authorized by:   Lehman Prom FNP   Signed by:   Lehman Prom FNP on 06/29/2009   Method used:   Print then Give to Patient   RxID:   [id number]

## 2010-04-30 NOTE — Assessment & Plan Note (Signed)
Summary: Neck and shoulder pain   Vital Signs:  Patient profile:   55 year old female Menstrual status:  hysterectomy Weight:      159.9 pounds BMI:     29.35 BSA:     1.74 Temp:     97.9 degrees F oral Pulse rate:   77 / minute Pulse rhythm:   regular Resp:     20 per minute BP sitting:   127 / 85  (left arm) Cuff size:   regular  Vitals Entered By: [name] (July 04, 2009 11:56 AM) CC: knot on left shoulder with pain x 1 month not sure what this is , Hypertension Management Is Patient Diabetic? No Pain Assessment Patient in pain? yes     Location: shoulder Intensity: 7  Does patient need assistance? Functional Status Self care Ambulation Normal     Menstrual Status hysterectomy Last PAP Result negative for malignancy, + trichomonias   CC:  knot on left shoulder with pain x 1 month not sure what this is  and Hypertension Management.  History of Present Illness:  Pt into the office for f/u on neck pain  Hx cervical degeneration - cervical x-ray done in 2009 "Knot" on her neck - present for several months, pt just as a question as to why it is sometimes tender and other times not She has had ongoing problems with left shoulder and neck over the past years - previous MRI and x-rays done   Left shoulder - pt had MRI back in 2009 which showed Subacromial and subdeltoid bursitis.  Mild acromioclavicular joint arthropathy.  Pt went to physical therapy at the end of 2010 and finished the treatment plan but did not see great results.   She is still employed as a Ambulance person.  Her hours have now been cut to 8-12 per week versus the 40 per week 6 months ago.  Hypertension History:      She denies headache, chest pain, and palpitations.  She notes no problems with any antihypertensive medication side effects.  Further comments include: Nurse at work checks BP and it is elevated at times.  pt takes 2 of her blood pressure medications. she has done this only twice.          Positive major cardiovascular risk factors include hypertension and current tobacco user.  Negative major cardiovascular risk factors include female age less than 49 years old and negative family history for ischemic heart disease.        Further assessment for target organ damage reveals no history of ASHD, cardiac end-organ damage (CHF/LVH), stroke/TIA, peripheral vascular disease, renal insufficiency, or hypertensive retinopathy.     Habits & Providers  Alcohol-Tobacco-Diet     Alcohol drinks/day: 3     Alcohol type: beer, occasionally     Tobacco Status: current     Tobacco Counseling: to quit use of tobacco products     Cigarette Packs/Day: 1/2     Year Started: 1981  Exercise-Depression-Behavior     Does Patient Exercise: no     Type of exercise: walking     Drug Use: no     Seat Belt Use: 100     Sun Exposure: occasionally  Allergies (verified): No Known Drug Allergies  Review of Systems CV:  Denies chest pain or discomfort. Resp:  Denies cough. MS:  neck pain, left shoulder pain.  Physical Exam  General:  alert.   Head:  normocephalic.   Neck:  ROM intact - palpable  trigger point at suboccipital muscle insertion at base of skull Lungs:  normal breath sounds.   Heart:  normal rate and regular rhythm.     Shoulder/Elbow Exam  Shoulder Exam:    Left:    Inspection:  Normal       Location:  left infrascapular    Stability:  stable    Tenderness:  left infrascapular    Swelling:  no    Erythema:  no    Range of Motion:       Flexion-Active: 45   Impression & Recommendations:  Problem # 1:  DEGENERATIVE DISC DISEASE, CERVICAL SPINE (ICD-722.4) previous dx pt should continue her anti-inflammatories area of concern may be a trigger point  Problem # 2:  SUBACROMIAL BURSITIS, LEFT (ICD-726.19)  pt went to physical therapy in the past without good results may see some benefit from having assessment done at sports medicine particularly since pt is not  working as much as previously   Orders: Sports Medicine (Sports Med)  Problem # 3:  ESSENTIAL HYPERTENSION, BENIGN (ICD-401.1)  Her updated medication list for this problem includes:    Lisinopril 10 Mg Tabs (Lisinopril) ..... One tablet by mouth by mouth daily for blood pressure  **note change in dose**  Complete Medication List: 1)  Flexeril 10 Mg Tabs (Cyclobenzaprine hcl) .Marland Kitchen.. 1 tablet by mouth nightly as needed for muscle spasms 2)  Lisinopril 10 Mg Tabs (Lisinopril) .... One tablet by mouth by mouth daily for blood pressure  **note change in dose** 3)  Glucosamine-chondroitin Tabs (Glucosamine-chondroit-vit c-mn) .Marland Kitchen.. 1 tablet by mouth two times a day 4)  Vicodin 5-500 Mg Tabs (Hydrocodone-acetaminophen) .Marland Kitchen.. 1 tablet by mouth two times a day as needed for pain 5)  Gabapentin 300 Mg Caps (Gabapentin) .... One tablet by mouth nightly for shoulder 6)  Diclofenac Sodium 75 Mg Tbec (Diclofenac sodium) .... One tablet by mouth two times a day for shoulder 7)  Calcium 600/vitamin D 600-400 Mg-unit Tabs (Calcium carbonate-vitamin d) .... One tablet by mouth two times a day for bones 8)  Loratadine 10 Mg Tabs (Loratadine) .... One tablet by mouth daily for allergies  Hypertension Assessment/Plan:      The patient's hypertensive risk group is category B: At least one risk factor (excluding diabetes) with no target organ damage.  Her calculated 10 year risk of coronary heart disease is 11 %.  Today's blood pressure is 127/85.  Her blood pressure goal is < 140/90.  Patient Instructions: 1)  Schedule an appointment for a complete physical exam. 2)  will need pap, mammogram, bone density, labs, PHQ-9, EKG 3)  Do not eat after midnight before this visit. 4)  You will be referred to Sports Medicine for your left shoulder. Prescriptions: LORATADINE 10 MG TABS (LORATADINE) One tablet by mouth daily for allergies  #30 x 3   Entered and Authorized by:   Lehman Prom FNP   Signed by:   Lehman Prom FNP on 07/04/2009   Method used:   Print then Give to Patient   RxID:   [id number]

## 2010-04-30 NOTE — Letter (Signed)
Summary: *HSN Results Follow up  [facility]  [address] [place], [place] [address]   Phone: [phone]  Fax: [phone]      02/25/2010   Cynthia Hess [address] [place], [place]  [address]   Dear  Ms. Cynthia Hess,                            ____S.Drinkard,FNP   ____D. Gore,FNP       ____B. McPherson,MD   ____V. Rankins,MD    ____E. Mulberry,MD    __X__N. Daphine Deutscher, FNP  ____D. Reche Dixon, MD    ____K. Philipp Deputy, MD    ____Other     This letter is to inform you that your recent test(s):  ___X____Pap Smear    _______Lab Test     _______X-ray    ___X____ is within acceptable limits  _______ requires a medication change  _______ requires a follow-up lab visit  _______ requires a follow-up visit with your W[facility] Penning   Comments:  Pap Smear results are normal for any cancer cells.  You will need a pap smear repeated in 2 years.       _________________________________________________________ If you have any questions, please contact our office [phone].                    Sincerely,    Lehman Prom FNP [facility]

## 2010-04-30 NOTE — Letter (Signed)
Summary: MAILED REQUESTED RECORDS TO [facility]  MAILED REQUESTED RECORDS TO [facility]   Imported By: Arta Bruce 01/18/2010 14:44:47  _____________________________________________________________________  External Attachment:    Type:   Image     Comment:   External Document

## 2010-04-30 NOTE — Progress Notes (Signed)
Summary: vicodin medication refills  Phone Note Call from Patient Call back at Home Phone [phone]   Summary of Call: the pt needs more refills from vicodin medication. [facility] fnp Initial call taken by: Manon Hilding,  May 31, 2009 10:25 AM  Follow-up for Phone Call        last rx written 05/02/09.Marland Kitchenwil forward to provider to review Follow-up by: Mikey College CMA,  May 31, 2009 10:29 AM  Additional Follow-up for Phone Call Additional follow up Details #1::        rx printed and in basket fax to pt's pharmacy Additional Follow-up by: Lehman Prom FNP,  May 31, 2009 8:06 PM    Additional Follow-up for Phone Call Additional follow up Details #2::    [name]  June 01, 2009 4:09 PM Left message on machine for pt to return call to the office.  Additional Follow-up for Phone Call Additional follow up Details #3:: Details for Additional Follow-up Action Taken: PATIENT PICKED UP RX TODAY. Additional Follow-up by: Leodis Rains,  June 01, 2009 4:44 PM  Prescriptions: VICODIN 5-500 MG TABS (HYDROCODONE-ACETAMINOPHEN) 1 tablet by mouth two times a day as needed for pain  #50 x 0   Entered and Authorized by:   Lehman Prom FNP   Signed by:   Lehman Prom FNP on 05/31/2009   Method used:   Printed then faxed to ...       [facility] (retail)       [address] [place], [place]  [address]       Ph: [phone]       Fax: [phone]   RxID:   [id number]

## 2010-04-30 NOTE — Assessment & Plan Note (Signed)
Summary: Complete Physical Exam   Vital Signs:  Patient profile:   55 year old female Menstrual status:  hysterectomy Height:      62 inches Weight:      159.9 pounds BMI:     29.35 Temp:     98.5 degrees F oral Pulse rate:   88 / minute Pulse rhythm:   regular Resp:     18 per minute BP sitting:   138 / 96  (left arm) Cuff size:   regular  Vitals Entered By: CMA Student Linzie Collin   Nutrition Counseling: Patient's BMI is greater than 25 and therefore counseled on weight management options. CC: Complete Physical, Hypertension Management, Back Pain, Depression Is Patient Diabetic? No Pain Assessment Patient in pain? no       Does patient need assistance? Functional Status Self care Ambulation Normal   CC:  Complete Physical, Hypertension Management, Back Pain, and Depression.  History of Present Illness:  Pt into the office for a complete physical exam  PAP - last done in 2009.   previous bladder surgery many years ago.  (unsure of the specifics) s/p hysterectomy  Mammogram - Last done in 2009 no self breast exam at home no family hx of breast cancer  Optho -no recent eye exam.  admits that she likel does need an eye exam  Dental - no recent dental exam.  Shoulder - ongoing problems with left shoulder (previous MRI of the left shoulder) Now she reports that she has problems with right shoulder Pt went to the ER a couple of weeks ago for left arm swelling.  She was started on prednisone taper which helped with inflamation. Pt was prescribed diclofenac as per this office but she reports that it was not effective   Back Pain History:      The patient's back pain has been present for > 6 weeks.  The pain is located in the lower back region and does not radiate below the knees.  She states that she has had a prior history of back pain.  The patient has had a recent course of physical therapy.  The following makes the back pain better: rest.    Critical  Exclusionary Diagnosis Criteria (CEDC) for Back Pain:      The patient denies a history of previous trauma.  She has no prior history of spinal surgery.  There are no symptoms to suggest infection, cancer, cauda equina, or psychosocial factors for back pain.  Other positive CEDC factors include low back pain worse with activity.    Depression History:      The patient presents with symptoms of depression which have been present for greater than two weeks.  The patient is having a depressed mood most of the day and has a diminished interest in her usual daily activities.  Positive alarm features for depression include fatigue (loss of energy).        Psychosocial stress factors include major life changes.  The patient denies that she feels like life is not worth living, denies that she wishes that she were dead, and denies that she has thought about ending her life.         Hypertension History:      She denies headache, chest pain, and palpitations.  Pt has already taken her lisinopril today. .  Further comments include: admits to a low sodium diet.        Positive major cardiovascular risk factors include hypertension and current tobacco user.  Negative major cardiovascular risk factors include female age less than 81 years old and negative family history for ischemic heart disease.        Further assessment for target organ damage reveals no history of ASHD, cardiac end-organ damage (CHF/LVH), stroke/TIA, peripheral vascular disease, renal insufficiency, or hypertensive retinopathy.       Habits & Providers  Alcohol-Tobacco-Diet     Alcohol drinks/day: 3     Alcohol Counseling: to decrease amount and/or frequency of alcohol intake     Alcohol type: beer, occasionally     Tobacco Status: current     Tobacco Counseling: to quit use of tobacco products     Cigarette Packs/Day: 1/2     Year Started: 1981  Exercise-Depression-Behavior     Does Patient Exercise: no     Type of exercise:  walking     Depression Counseling: not indicated; screening negative for depression     Drug Use: no     Seat Belt Use: 100     Sun Exposure: occasionally  Comments: PHQ-9 score = 12  Allergies (verified): No Known Drug Allergies  Review of Systems General:  Denies fever. Eyes:  Denies blurring. ENT:  Denies earache. CV:  Denies fatigue. Resp:  Denies cough. GI:  Complains of constipation; denies abdominal pain, nausea, and vomiting; Pt has started to eat raisin brain and oatmeal. GU:  Complains of incontinence and urinary frequency; denies discharge. MS:  Complains of low back pain. Derm:  Denies rash. Neuro:  Denies headaches. Psych:  Complains of depression.  Physical Exam  General:  alert.   Head:  normocephalic.   Eyes:  pupils round.   Ears:  bil TM with bony landmarks present Nose:  no nasal discharge.   Mouth:  pharynx pink and moist and fair dentition.   Neck:  supple.   Chest Wall:  no mass.   Breasts:  skin/areolae normal and no abnormal thickening.   Lungs:  normal breath sounds.   Heart:  normal rate and regular rhythm.   Abdomen:  normal bowel sounds.   Rectal:  no external abnormalities.   Pulses:  R radial normal and L radial normal.    Pelvic Exam  Vulva:      normal appearance.   Urethra and Bladder:      Urethra--normal.  Bladder--normal.   Vagina:      physiologic discharge.   Cervix:      absent Uterus:      smooth.   Adnexa:      nontender bilaterally.   Rectum:      normal, heme negative stool.     Shoulder/Elbow Exam  Shoulder Exam:    Left:    Inspection:  Normal       Location:  left infrascapular    Stability:  stable    Tenderness:  left infrascapular    Swelling:  no    Erythema:  no    Impression & Recommendations:  Problem # 1:  ROUTINE GYNECOLOGICAL EXAMINATION (ICD-V72.31) labs done during previous visit. rec optho and dental  Orders: KOH/ WET Mount [phone]) Pap Smear, Thin Prep ( Collection of)  [phone]) T- GC Chlamydia ([address]11)  Problem # 2:  OTHER SCREENING BREAST EXAMINATION (ICD-V76.19)  self breast exam given  mammogram scheduled  Orders: Mammogram (Screening) (Mammo)  Problem # 3:  ESSENTIAL HYPERTENSION, BENIGN (ICD-401.1)  BP is elevated DASH diet recommended. Her updated medication list for this problem includes:    Lisinopril 20 Mg Tabs (Lisinopril) .Marland KitchenMarland KitchenMarland KitchenMarland Kitchen  One tablet by mouth daily for blood pressure  Orders: UA Dipstick w/o Micro (manual) (09811) EKG w/ Interpretation (93000)  Problem # 4:  TOBACCO ABUSE (ICD-305.1) advised cessation  Problem # 5:  DEPRESSION (ICD-311)  will refer to Aquilla Solian  will start on cymbalta Her updated medication list for this problem includes:    Cymbalta 30 Mg Cpep (Duloxetine hcl) ..... One tablet by mouth daily for mood/pain  Orders: Misc. Referral (Misc. Ref)  Problem # 6:  CONSTIPATION (ICD-564.00)  Her updated medication list for this problem includes:    Colace 100 Mg Caps (Docusate sodium) ..... One capsule by mouth two times a day for stools  Problem # 7:  NEPHROLITHIASIS (ICD-592.0) noted on CT in 2009  will need to repeat  Problem # 8:  TRICHOMONIASIS (ICD-131.9) will treat  Complete Medication List: 1)  Lisinopril 20 Mg Tabs (Lisinopril) .... One tablet by mouth daily for blood pressure 2)  Vicodin 5-500 Mg Tabs (Hydrocodone-acetaminophen) .Marland Kitchen.. 1 tablet by mouth two times a day as needed for pain 3)  Diclofenac Sodium 75 Mg Tbec (Diclofenac sodium) .... One tablet by mouth two times a day for shoulder 4)  Calcium 600/vitamin D 600-400 Mg-unit Tabs (Calcium carbonate-vitamin d) .... One tablet by mouth two times a day for bones 5)  Loratadine 10 Mg Tabs (Loratadine) .... One tablet by mouth daily for allergies 6)  Cymbalta 30 Mg Cpep (Duloxetine hcl) .... One tablet by mouth daily for mood/pain 7)  Colace 100 Mg Caps (Docusate sodium) .... One capsule by mouth two times a day for stools 8)   Metronidazole 500 Mg Tabs (Metronidazole) .... Four tablets by mouth x 1 dose  Other Orders: T- * Misc. Laboratory test [phone]) T-Hepatic Function [phone])  Hypertension Assessment/Plan:      The patient's hypertensive risk group is category B: At least one risk factor (excluding diabetes) with no target organ damage.  Her calculated 10 year risk of coronary heart disease is 15 %.  Today's blood pressure is 138/96.  Her blood pressure goal is < 140/90.  Patient Instructions: 1)  Schedule an appointment with Aquilla Solian to discuss mood. 2)  There are some strategies she can review to help relieve stress. 3)  Will start on cymbalta 30mg  by mouth daily.  This will help with mood and pain. (prescription [facility] to [facility]) 4)  Constipation - your pain medication is causing constipation.  You need to drink more water and also start a stool softner (colace) and take everytime you take a pain pill. 5)  Blood pressure - slightly elevated.  You next prescription for lisinopril will be 20mg  by mouth daily 6)  Blood in urine - in 2009 you had a CT that showed tiny kidney stones.  At some point this will need to be re-evaluated. 7)  Follow up in 4-6 weeks for mood and medication review (cymbalta). Discuss repeat CT abd/pelvis Prescriptions: METRONIDAZOLE 500 MG TABS (METRONIDAZOLE) Four tablets by mouth x 1 dose  #4 x 0   Entered and Authorized by:   Lehman Prom FNP   Signed by:   Lehman Prom FNP on 02/19/2010   Method used:   Print then Give to Patient   RxID:   [id number] CYMBALTA 30 MG CPEP (DULOXETINE HCL) One tablet by mouth daily for mood/pain  #30 x 5   Entered and Authorized by:   Lehman Prom FNP   Signed by:   Lehman Prom FNP on 02/19/2010   Method used:  Faxed to ...       [facility] (retail)       [address] [place], [place]  [address]       Ph: [phone]       Fax: [phone]   RxID:    [id number] LISINOPRIL 20 MG TABS (LISINOPRIL) One tablet by mouth daily for blood pressure  #30 x 5   Entered and Authorized by:   Lehman Prom FNP   Signed by:   Lehman Prom FNP on 02/19/2010   Method used:   Faxed to ...       [facility] (retail)       [address] [place], [place]  [address]       Ph: [phone]       Fax: [phone]   RxID:   [id number]    Orders Added: 1)  Est. Patient age 58&> [66063] 2)  UA Dipstick w/o Micro (manual) [81002] 3)  KOH/ WET Mount [01601] 4)  Pap Smear, Thin Prep ( Collection of) [Q0091] 5)  T- GC Chlamydia [09323] 6)  EKG w/ Interpretation [93000] 7)  T- * Misc. Laboratory test [99999] 8)  T-Hepatic Function [80076-22960] 9)  Mammogram (Screening) [Mammo] 10)  Misc. Referral [Misc. Ref]    Laboratory Results   Urine Tests    Routine Urinalysis   Color: yellow Glucose: negative   (Normal Range: Negative) Bilirubin: negative   (Normal Range: Negative) Ketone: negative   (Normal Range: Negative) Spec. Gravity: 1.015   (Normal Range: 1.003-1.035) Blood: moderate   (Normal Range: Negative) pH: 5.5   (Normal Range: 5.0-8.0) Protein: negative   (Normal Range: Negative) Urobilinogen: 0.2   (Normal Range: 0-1) Nitrite: negative   (Normal Range: Negative) Leukocyte Esterace: negative   (Normal Range: Negative)    Date/Time Received: February 19, 2010 10:41 AM   Wet Mount/KOH Source: vaginal WBC/hpf: 1-5 Bacteria/hpf: rare Clue cells/hpf: none Yeast/hpf: none Trichomonas/hpf: moderate    EKG  Procedure date:  02/19/2010  Findings:      NSR   Laboratory Results   Urine Tests    Routine Urinalysis   Color: yellow Glucose: negative   (Normal Range: Negative) Bilirubin: negative   (Normal Range: Negative) Ketone: negative   (Normal Range: Negative) Spec. Gravity: 1.015   (Normal Range: 1.003-1.035) Blood: moderate   (Normal Range:  Negative) pH: 5.5   (Normal Range: 5.0-8.0) Protein: negative   (Normal Range: Negative) Urobilinogen: 0.2   (Normal Range: 0-1) Nitrite: negative   (Normal Range: Negative) Leukocyte Esterace: negative   (Normal Range: Negative)      Wet Mount Wet Mount KOH: Negative   Appended Document: Complete Physical Exam    Clinical Lists Changes  Observations: Added new observation of COLONRECACT: Deferred (02/19/2010 10:59) Added new observation of DM PROGRESS: N/A (02/19/2010 10:59) Added new observation of DM FSREVIEW: N/A (02/19/2010 10:59) Added new observation of LIPID PROGRS: N/A (02/19/2010 10:59) Added new observation of LIPID FSREVW: N/A (02/19/2010 10:59)       Prevention & Chronic Care Immunizations   Influenza vaccine: allergic to flu vaccine  (02/19/2010)   Influenza vaccine deferral: Contraindicated  (01/09/2010)    Tetanus booster: 04/22/2007: Tdap    Pneumococcal vaccine: Not documented  Colorectal Screening   Hemoccult: negative3  (04/22/2007)    Colonoscopy: decines; indication given  (04/22/2007)   Colonoscopy action/deferral: Deferred  (02/19/2010)  Other Screening  Pap smear: negative for malignancy, + trichomonias  (04/22/2007)    Mammogram: normal  (04/26/2007)   Smoking status: current  (02/19/2010)   Smoking cessation counseling: yes  (11/11/2007)  Lipids   Total Cholesterol: 187  (05/07/2007)   LDL: 114  (05/07/2007)   LDL Direct: Not documented   HDL: 46  (05/07/2007)   Triglycerides: 133  (05/07/2007)  Hypertension   Last Blood Pressure: 138 / 96  (02/19/2010)   Serum creatinine: 0.54  (01/09/2010)   Serum potassium 4.3  (01/09/2010)  Self-Management Support :    Hypertension self-management support: Not documented

## 2010-04-30 NOTE — Progress Notes (Signed)
Summary: vicodin refill  Phone Note Call from Patient Call back at Home Phone [phone]   Caller: Patient Reason for Call: Refill Medication Summary of Call: Please call in a refill on her vicodin rx Initial call taken by: Janace Aris,  November 01, 2009 2:30 PM  Follow-up for Phone Call        forward to n. Daphine Deutscher, fnp last filled 09/28/09 Follow-up by: [name],  November 01, 2009 5:59 PM    Prescriptions: VICODIN 5-500 MG TABS (HYDROCODONE-ACETAMINOPHEN) 1 tablet by mouth two times a day as needed for pain  #50 x 0   Entered and Authorized by:   Lehman Prom FNP   Signed by:   Lehman Prom FNP on 11/01/2009   Method used:   Printed then faxed to ...       [facility] [address] [phone]* (retail)       [address]       [place], [place]  [address]       Ph: [phone]       Fax: [phone]   RxID:   [id number]

## 2010-04-30 NOTE — Progress Notes (Signed)
Summary: REFILL ON VICODEN  Phone Note From Pharmacy Call back at [facility] Phone [phone]   Reason for Call: Refill Medication Summary of Call: Cynthia Hess. MS Cordle IS CALLING FOR HER VICODEN TO BE CALLED INTO [facility] [address] Initial call taken by: Leodis Rains,  May 01, 2009 11:33 AM  Follow-up for Phone Call        forward to N. Daphine Deutscher, FNP last filled on 04/02/09 Follow-up by: [name],  May 01, 2009 5:08 PM  Additional Follow-up for Phone Call Additional follow up Details #1::        Rx printed for shiela to [facility] to [facility] advise Hess to check with pharmacy later today Additional Follow-up by: Lehman Prom FNP,  May 02, 2009 8:22 AM    Additional Follow-up for Phone Call Additional follow up Details #2::    [name]  May 02, 2009 12:56 PM Left message for Hess to return call to the office  [name]  May 03, 2009 12:40 PM Hess informed of above information.  Prescriptions: VICODIN 5-500 MG TABS (HYDROCODONE-ACETAMINOPHEN) 1 tablet by mouth two times a day as needed for pain  #50 x 0   Entered and Authorized by:   Lehman Prom FNP   Signed by:   Lehman Prom FNP on 05/02/2009   Method used:   Printed then faxed to ...       [facility] [address] [phone]* (retail)       [address]       [place], [place]  [address]       Ph: [phone]       Fax: [phone]   RxID:   [id number]

## 2010-04-30 NOTE — Miscellaneous (Signed)
Summary: Rehab Report/DISCHARGE SUMMARY  Rehab Report/DISCHARGE SUMMARY   Imported By: Arta Bruce 04/18/2009 11:47:09  _____________________________________________________________________  External Attachment:    Type:   Image     Comment:   External Document

## 2010-04-30 NOTE — Progress Notes (Signed)
Summary: Office Visit//DEPRESSION SCREENING  Office Visit//DEPRESSION SCREENING   Imported By: Arta Bruce 02/20/2010 09:42:17  _____________________________________________________________________  External Attachment:    Type:   Image     Comment:   External Document

## 2010-04-30 NOTE — Progress Notes (Signed)
Summary: Vicodin refill  Phone Note Call from Patient Call back at Home Phone [phone]   Reason for Call: Refill Medication Summary of Call: PT NEEDS REFILL FOR VICODIN/ [facility]//[phone]/HAS NONE AND IS IN PAIN Initial call taken by: Arta Bruce,  April 02, 2009 8:45 AM  Follow-up for Phone Call        last filled 03/02/09 forward to N. Daphine Deutscher, FNP Follow-up by: [name],  April 02, 2009 5:17 PM  Additional Follow-up for Phone Call Additional follow up Details #1::        Rx in basket for [facility]iela to [facility]  Additional Follow-up by: Lehman Prom FNP,  April 02, 2009 5:38 PM    Additional Follow-up for Phone Call Additional follow up Details #2::    FAXED TO [facility]  /PT [facility]  Follow-up by: Arta Bruce,  April 03, 2009 9:58 AM  Prescriptions: VICODIN 5-500 MG TABS (HYDROCODONE-ACETAMINOPHEN) 1 tablet by mouth two times a day as needed for pain  #50 x 0   Entered and Authorized by:   Lehman Prom FNP   Signed by:   Lehman Prom FNP on 04/02/2009   Method used:   Printed then faxed to ...       [facility] [address] [phone]* (retail)       [address]       [place], [place]  [address]       Ph: [phone]       Fax: [phone]   RxID:   [id number]

## 2010-04-30 NOTE — Assessment & Plan Note (Signed)
Summary: Back pain   Vital Signs:  Patient profile:   55 year old female Menstrual status:  hysterectomy Weight:      160.2 pounds BMI:     29.41 Temp:     97.6 degrees F oral Pulse rate:   76 / minute Pulse rhythm:   regular Resp:     20 per minute BP sitting:   136 / 96  (left arm) Cuff size:   regular  Vitals Entered By: [name] (January 09, 2010 11:30 AM)  Nutrition Counseling: Patient's BMI is greater than 25 and therefore counseled on weight management options. CC: x 2 weeks pain in left wrist and swelling. her right one  hurts also but left is giving her alot of pain...right leg has been giving out and giving her alot of pain, Back Pain, Hypertension Management Is Patient Diabetic? No Pain Assessment Patient in pain? no       Does patient need assistance? Functional Status Self care Ambulation Normal   CC:  x 2 weeks pain in left wrist and swelling. her right one  hurts also but left is giving her alot of pain...right leg has been giving out and giving her alot of pain, Back Pain, and Hypertension Management.  History of Present Illness:  Pt into the office for routine f/u.  Wrist pain - bilateral wrist pain.   Seen in the ER 2-3 weeks ago. Swelling in both the wrists with nodule on left wrist.   Pt called this office when swelling re-presented last week and she was advised to elevated wrists which helped and she has not been back to work. 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    Back Pain History:      The patient's back pain has been present for > 6 weeks.  The pain is located in the lower back region and does radiate below the knees.  She states that she has had a prior history of back pain.  The patient has had a recent course of physical therapy.  The following makes the back pain worse: ambulation.        Description of injury in patient's own words:  Back pain  is ongoing.  Symptoms are not improving. Previous workup with x-ray and MRI.    Critical Exclusionary Diagnosis Criteria (CEDC) for Back Pain:      The patient denies a history of previous trauma.  She has no prior history of spinal surgery.  There are no symptoms to suggest infection, cancer, cauda equina, or psychosocial factors for back pain.  Other positive CEDC factors include low back pain worse with lumbar extension (downhill walking-standing-reaching overhead).    Hypertension History:      She denies headache, chest pain, and palpitations.  Pt has been without her BP meds for over 1 month - she has called the Rx into the pharmacy and reports she is going to pick it up today.        Positive major cardiovascular risk factors include hypertension and current tobacco user.  Negative major cardiovascular risk factors include female age less than 62 years old and negative family history for ischemic heart disease.        Further assessment for target organ damage reveals no history of ASHD, cardiac end-organ damage (CHF/LVH), stroke/TIA, peripheral vascular disease, renal insufficiency, or hypertensive retinopathy.      Habits & Providers  Alcohol-Tobacco-Diet  Alcohol drinks/day: 3     Alcohol type: beer, occasionally     Tobacco Status: current     Tobacco Counseling: to quit use of tobacco products     Cigarette Packs/Day: 1/2     Year Started: 1981  Exercise-Depression-Behavior     Does Patient Exercise: no     Type of exercise: walking     Have you felt down or hopeless? no     Have you felt little pleasure in things? no     Depression Counseling: not indicated; screening negative for depression     Drug Use: no     Seat Belt Use: 100     Sun Exposure: occasionally  Allergies (verified): No Known Drug Allergies  Review of Systems CV:  Denies chest pain or discomfort. Resp:  Denies cough. GI:  Denies abdominal pain, nausea, and vomiting. MS:  Complains of joint pain  and low back pain; bil wrist pain.  Physical Exam  General:  alert.   Head:  normocephalic.   Lungs:  normal breath sounds.   Heart:  normal rate and regular rhythm.   Abdomen:  normal bowel sounds.   Msk:  up to the exam table; no assist Neurologic:  alert & oriented X3.     Impression & Recommendations:  Problem # 1:  WRIST PAIN, BILATERAL (ICD-719.43) advised pt to limit repeative movement  Problem # 2:  BACK PAIN, LUMBAR (ICD-724.2) ongoing. The following medications were removed from the medication list:    Flexeril 10 Mg Tabs (Cyclobenzaprine hcl) .Marland Kitchen... 1 tablet by mouth nightly as needed for muscle spasms Her updated medication list for this problem includes:    Vicodin 5-500 Mg Tabs (Hydrocodone-acetaminophen) .Marland Kitchen... 1 tablet by mouth two times a day as needed for pain    Diclofenac Sodium 75 Mg Tbec (Diclofenac sodium) ..... One tablet by mouth two times a day for shoulder  Problem # 3:  TOBACCO ABUSE (ICD-305.1) advised cessation  Complete Medication List: 1)  Lisinopril 10 Mg Tabs (Lisinopril) .... One tablet by mouth by mouth daily for blood pressure  **note change in dose** 2)  Glucosamine-chondroitin Tabs (Glucosamine-chondroit-vit c-mn) .Marland Kitchen.. 1 tablet by mouth two times a day 3)  Vicodin 5-500 Mg Tabs (Hydrocodone-acetaminophen) .Marland Kitchen.. 1 tablet by mouth two times a day as needed for pain 4)  Diclofenac Sodium 75 Mg Tbec (Diclofenac sodium) .... One tablet by mouth two times a day for shoulder 5)  Calcium 600/vitamin D 600-400 Mg-unit Tabs (Calcium carbonate-vitamin d) .... One tablet by mouth two times a day for bones 6)  Loratadine 10 Mg Tabs (Loratadine) .... One tablet by mouth daily for allergies  Other Orders: Mammogram (Screening) (Mammo) T-Comprehensive Metabolic Panel [phone]) T-CBC w/Diff ([id number]) Rapid HIV  ([address]) T-Syphilis Test (RPR) ([id number]) T-TSH [phone])  Hypertension Assessment/Plan:      The patient's hypertensive risk  group is category B: At least one risk factor (excluding diabetes) with no target organ damage.  Her calculated 10 year risk of coronary heart disease is 15 %.  Today's blood pressure is 136/96.  Her blood pressure goal is < 140/90.  Patient Instructions: 1)  Bilateral wrist pain - most likely due to overuse. 2)  Take diclofenac 75mg  by mouth two times a day as needed for swelling 3)  Back pain - most likely constant due to nature of your job. 4)  Take medications as needed  5)  You have been scheduled for a mammogram 6)  You will NOT get the  flu vaccine today since you had a reaction last year. 7)  Schedule an appointment for a complete physical exam. 8)  Do not eat after midnight before this visit. 9)  You will need lipids, EKG, mammogram, PHQ-9, u/a, microalbumin. Prescriptions: DICLOFENAC SODIUM 75 MG TBEC (DICLOFENAC SODIUM) One tablet by mouth two times a day for shoulder  #60 x 1   Entered and Authorized by:   Lehman Prom FNP   Signed by:   Lehman Prom FNP on 01/09/2010   Method used:   Print then Give to Patient   RxID:   [id number]   Prevention & Chronic Care Immunizations   Influenza vaccine: given by pt's employer  (12/29/2008)   Influenza vaccine deferral: Contraindicated  (01/09/2010)    Tetanus booster: 04/22/2007: Tdap    Pneumococcal vaccine: Not documented  Colorectal Screening   Hemoccult: negative3  (04/22/2007)    Colonoscopy: decines; indication given  (04/22/2007)  Other Screening   Pap smear: negative for malignancy, + trichomonias  (04/22/2007)    Mammogram: normal  (04/26/2007)   Smoking status: current  (01/09/2010)   Smoking cessation counseling: yes  (11/11/2007)  Lipids   Total Cholesterol: 187  (05/07/2007)   LDL: 114  (05/07/2007)   LDL Direct: Not documented   HDL: 46  (05/07/2007)   Triglycerides: 133  (05/07/2007)  Hypertension   Last Blood Pressure: 136 / 96  (01/09/2010)   Serum creatinine: 0.72  (12/29/2008)    Serum potassium 3.9  (12/29/2008) CMP ordered   Self-Management Support :    Hypertension self-management support: Not documented  Laboratory Results  Date/Time Received: January 09, 2010 12:49 PM   Other Tests  Rapid HIV: negative

## 2010-04-30 NOTE — Progress Notes (Signed)
Summary: Refills from vicodin  Phone Note Call from Patient Call back at [facility] Phone [phone] Call back at [phone]   Summary of Call: Pt needs more refills from vicodin medication. [facility] FNP Initial call taken by: Manon Hilding,  July 26, 2009 12:09 PM  Follow-up for Phone Call        Last got #50 on 06/29/09. Follow-up by: Vesta Mixer CMA,  July 26, 2009 12:22 PM  Additional Follow-up for Phone Call Additional follow up Details #1::        [facility] -  notify pt Additional Follow-up by: Lehman Prom FNP,  July 26, 2009 12:33 PM    Additional Follow-up for Phone Call Additional follow up Details #2::    pt informed.  Rx faxed to [facility] [address] Follow-up by: [name],  July 26, 2009 2:57 PM

## 2010-04-30 NOTE — Letter (Signed)
Summary: Handout Printed  Printed Handout:  - Trichomonas Infection-Brief 

## 2010-04-30 NOTE — Progress Notes (Signed)
Summary: Having back/leg pain  Phone Note Call from Patient   Summary of Call: WENT TO DISABILITY DR Farrell Ours Reather Littler HAD HER BENDIN AND SHE IS HURTING REALLY  BAD BACK PAIN SHOOT OUT TO LET WANTS A CORDISONE SHOT///[phone] Initial call taken by: Arta Bruce,  March 04, 2010 8:59 AM  Follow-up for Phone Call        Has been hurting the whole weekend, can barely walk.  Constantly hurting, pain is shooting from lower back down right leg.  Wants appt for cortisone injection.  Advised that she needs to see provider for assessment first, no available appointments before her scheduled appointment in January -- states that she can wait until then.  Advised to use prescribed pain meds and moist heat and to call back if anything changes -- verbalized agreement.   Follow-up by: Dutch Quint RN,  March 04, 2010 5:36 PM  Additional Follow-up for Phone Call Additional follow up Details #1::        Noted Additional Follow-up by: Lehman Prom FNP,  March 04, 2010 6:18 PM

## 2010-04-30 NOTE — Progress Notes (Signed)
Summary: Query:  Refill Calcium?  Phone Note Refill Request Call back at [phone] Message from:  Patient  Refills Requested: Medication #1:  CALCIUM 600/VITAMIN D 600-400 MG-UNIT TABS One tablet by mouth two times a day for bones  Medication #2:  LORATADINE 10 MG TABS One tablet by mouth daily for allergies. [facility] [address] NEED REFILLS  Initial call taken by: Domenic Polite,  January 10, 2010 9:39 AM  Follow-up for Phone Call        Do you want to refill her calcium?  Last Rx date 01/2009. Follow-up by: Dutch Quint RN,  January 10, 2010 11:19 AM  Additional Follow-up for Phone Call Additional follow up Details #1::        Calcium is on file at [facility] pharmacy.  She should call there for calcium refills - ( i know because I saw it as on her medication list from [place] when she was here in the office today) Additional Follow-up by: Lehman Prom FNP,  January 10, 2010 11:20 AM    Additional Follow-up for Phone Call Additional follow up Details #2::    Left message on answering machine for pt. to return call.  Dutch Quint RN  January 10, 2010 11:50 AM  Loratadine refilled, pt. notified -- advised pt. re calcium Rx.  Dutch Quint RN  January 11, 2010 4:47 PM   Prescriptions: LORATADINE 10 MG TABS (LORATADINE) One tablet by mouth daily for allergies  #30 x 3   Entered by:   Dutch Quint RN   Authorized by:   Lehman Prom FNP   Signed by:   Dutch Quint RN on 01/11/2010   Method used:   Faxed to ...       [facility] (retail)       [address] [place], [place]  [address]       Ph: [phone]       Fax: [phone]   RxID:   [id number]

## 2010-04-30 NOTE — Progress Notes (Signed)
Summary: Wrists swollen  Phone Note Call from Patient   Summary of Call: BOTH WRIST ARE SWOLLEN /HAD TO GO TO ER LAST WEEK WANTED TO COME IN TODAY ,BUT NOTHING FOR TODAY/WOULD LIKE TO KNOW WHAT SHE CAN DO DURING THE WEEKEND /SHE HAS APPT W/ [facility] ON WED Initial call taken by: Arta Bruce,  January 04, 2010 11:59 AM  Follow-up for Phone Call        States wrists started swelling again.  ER gave her ace wraps for swelling; advised that she could use them as ordered by them, as well as taking OTC Ibuprofen or naprosyn for the inflammation and pain.  Verbalized understanding.   Reviewed with Jesse Fall. Follow-up by: Dutch Quint RN,  January 04, 2010 12:18 PM

## 2010-04-30 NOTE — Letter (Signed)
Summary: [facility] //NEUROSURGICAL CONSULT.  [facility] //NEUROSURGICAL CONSULT.   Imported By: Arta Bruce 06/08/2009 11:55:42  _____________________________________________________________________  External Attachment:    Type:   Image     Comment:   External Document

## 2010-04-30 NOTE — Progress Notes (Signed)
Summary: Sports Medicine Referral  Phone Note Outgoing Call   Summary of Call: referral done to [facility] Left shoulder pain despite recent physical therapy  Initial call taken by: Lehman Prom FNP,  July 04, 2009 12:42 PM

## 2010-05-02 ENCOUNTER — Telehealth (INDEPENDENT_AMBULATORY_CARE_PROVIDER_SITE_OTHER): Payer: Self-pay | Admitting: Nurse Practitioner

## 2010-05-02 ENCOUNTER — Encounter (INDEPENDENT_AMBULATORY_CARE_PROVIDER_SITE_OTHER): Payer: Self-pay | Admitting: Nurse Practitioner

## 2010-05-02 NOTE — Letter (Signed)
Summary: TEST ORDER FORM/MRI//APPT DATE & TIME  TEST ORDER FORM/MRI//APPT DATE & TIME   Imported By: Arta Bruce 04/19/2010 14:32:59  _____________________________________________________________________  External Attachment:    Type:   Image     Comment:   External Document

## 2010-05-02 NOTE — Letter (Signed)
Summary: MAILED RECENT RECORDS TO [facility]  MAILED RECENT RECORDS TO [facility]   Imported By: Arta Bruce 03/27/2010 10:56:35  _____________________________________________________________________  External Attachment:    Type:   Image     Comment:   External Document

## 2010-05-02 NOTE — Letter (Signed)
Summary: EVAL /DISABILITY/DR.FIELDS  EVAL /DISABILITY/DR.FIELDS   Imported By: Arta Bruce 03/27/2010 15:24:18  _____________________________________________________________________  External Attachment:    Type:   Image     Comment:   External Document

## 2010-05-02 NOTE — Letter (Signed)
Summary: *Referral Letter  [facility]  [address] [place], [place] [address]   Phone: [phone]  Fax: [phone]    04/24/2010  Cynthia Hess [address] [place], [place]  [address]  Phone: [phone]  Cynthia Hess is an established patient in this office.  See below for her past medical conditions.  Of note is that she has back pain, Recent MRI shows that she has severe arthritis which is the likely cause of pain.  Current Medical Problems: 1)  DEPRESSION (ICD-311) 2)  TRANSAMINASES, SERUM, ELEVATED (ICD-790.4) 3)  ROUTINE GYNECOLOGICAL EXAMINATION (ICD-V72.31) 4)  WRIST PAIN, BILATERAL (ICD-719.43) 5)  OTHER SCREENING BREAST EXAMINATION (ICD-V76.19) 6)  CLOSED FRACTURE OF METATARSAL BONE (ICD-825.25) 7)  TOBACCO ABUSE (ICD-305.1) 8)  CONSTIPATION (ICD-564.00) 9)  CHEST PAIN (ICD-786.50) 10)  COSTOCHONDRITIS (ICD-733.6) 11)  HERNIATED LUMBOSACRAL DISC (ICD-722.10) 12)  DEGENERATIVE JOINT DISEASE, LUMBAR SPINE (ICD-721.90) 13)  DENTAL CARIES (ICD-521.00) 14)  BACK PAIN, LUMBAR (ICD-724.2) 15)  SUBACROMIAL BURSITIS, LEFT (ICD-726.19) 16)  DEGENERATIVE DISC DISEASE, CERVICAL SPINE (ICD-722.4) 17)  LUMBAR STRAIN (ICD-847.2) 18)  VENTRAL HERNIA (ICD-553.20) 19)  NEPHROLITHIASIS (ICD-592.0) 20)  ESSENTIAL HYPERTENSION, BENIGN (ICD-401.1) 21)  TRICHOMONIASIS (ICD-131.9) 22)  MICROSCOPIC HEMATURIA (ICD-599.72) 23)  HEALTH MAINTENANCE EXAM (ICD-V70.0) 24)  PAIN IN SOFT TISSUES OF LIMB (ICD-729.5)   Current Medications: 1)  LISINOPRIL 20 MG TABS (LISINOPRIL) One tablet by mouth daily for blood pressure 2)  VICODIN 5-500 MG TABS (HYDROCODONE-ACETAMINOPHEN) 1 tablet by mouth two times a day as needed for pain 3)  CALCIUM 600/VITAMIN D 600-400 MG-UNIT TABS (CALCIUM CARBONATE-VITAMIN D) One tablet by mouth two times a day for bones 4)  LORATADINE 10 MG TABS (LORATADINE) One tablet by mouth daily for allergies 5)  CYMBALTA 30 MG CPEP  (DULOXETINE HCL) One tablet by mouth daily for mood/pain 6)  COLACE 100 MG CAPS (DOCUSATE SODIUM) One capsule by mouth two times a day for stools 7)  IBUPROFEN 800 MG TABS (IBUPROFEN) ONe tablet by mouth two times a day as needed for pain      Please contact us if you have any further questions or need additional information.  Sincerely,    Lehman Prom FNP

## 2010-05-02 NOTE — Progress Notes (Signed)
Summary: Back Pain  Phone Note Outgoing Call   Summary of Call: notify pt that MRI shows she has SEVERE arthritis. She really needs ortho referral - again this is costly $250 Has pt attempted to apply for any financial assistance through social services?  is she still working? Initial call taken by: Cynthia Hess,  April 23, 2010 8:55 AM  Follow-up for Phone Call        Cynthia Hess  April 23, 2010 3:23 PM Left message on machine for pt  to return call to the office.  pt is currently working but has had to call out of work for this week for the pain she said that she will get with social services and see if she can get help, then she will contact the office to let us know what she will be able to do about getting in with the specialist. pt is asking if you can write a letter for her to take to social services tomorrow.  she is also requesting if she can get a note for work because she can not work like this. Cynthia Hess  April 24, 2010 11:07 AM  Follow-up by: Cynthia Hess,  April 24, 2010 10:55 AM  Additional Follow-up for Phone Call Additional follow up Details #1::        will write a letter. pt can take to social services, work or both. letter in basket - contact pt that it is ready Additional Follow-up by: Cynthia Hess,  April 24, 2010 12:35 PM    Additional Follow-up for Phone Call Additional follow up Details #2::    letter in front ofice file. pt will come to pick up letter today. Follow-up by: Cynthia Hess,  April 24, 2010 12:48 PM

## 2010-05-02 NOTE — Progress Notes (Signed)
Summary: REQUESTING IBUPROFEN  Phone Note Call from Patient Call back at Home Phone [phone]   Reason for Call: Talk to Nurse Summary of Call: [facility] PT. 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 SHE SAYS THE NURSE ON HER JOB WILL GIVE HER SOME IBUPROFEN 800MG .  SHE USES [facility] [address] Initial call taken by: Leodis Rains,  March 21, 2010 11:23 AM  Follow-up for Phone Call        Is currently on diclofenac -- does she want to change to ibuprofen?  Left message with child for pt. to return call.   Dutch Quint RN  March 21, 2010 11:58 AM  Left message with female for pt. to return call.  Dutch Quint RN  March 21, 2010 5:39 PM  States diclofenac isn't effective -- would like to be switched to the ibuprofen.  Uses [facility] [address]  Dutch Quint RN  March 22, 2010 8:55 AM   Additional Follow-up for Phone Call Additional follow up Details #1::        rx [facility] notify pt Additional Follow-up by: Lehman Prom FNP,  March 22, 2010 10:20 AM    Additional Follow-up for Phone Call Additional follow up Details #2::    Pt. notified.  Dutch Quint RN  March 22, 2010 11:19 AM   New/Updated Medications: IBUPROFEN 800 MG TABS (IBUPROFEN) ONe tablet by mouth two times a day as needed for pain Prescriptions: IBUPROFEN 800 MG TABS (IBUPROFEN) ONe tablet by mouth two times a day as needed for pain  #50 x 1   Entered and Authorized by:   Lehman Prom FNP   Signed by:   Lehman Prom FNP on 03/22/2010   Method used:   Electronically to        [facility] [phone]* (retail)       [address]       [place], [place]  [address]       Ph: [phone]       Fax: [phone]   RxID:   [id number]

## 2010-05-02 NOTE — Progress Notes (Signed)
Summary: Back pain  Phone Note Call from Patient   Summary of Call: pt called and is asking for an order for an x-ray or an MRI. she has been experiencing pain in her back, legs x3 weeks that is causing numbness in her toes.  She says that she has a major popping in her lower back..she states when she stands too long her toes go numb.  She says that she is in alot of pain and it not getting any better. Initial call taken by: [name],  April 18, 2010 8:47 AM  Follow-up for Phone Call        last MRI 05/2008 [facility] to reorder another (order in basket) Pt was also sent to [facility] in 2011 for consult and no surgery indicated at that time.  Of course if pt feels condition has changed then that would warrant reassessment and reimaging. Follow-up by: Lehman Prom FNP,  April 18, 2010 9:08 AM  Additional Follow-up for Phone Call Additional follow up Details #1::        left message on machine for pt to return call to the office. [name]  April 18, 2010 12:36 PM     Additional Follow-up for Phone Call Additional follow up Details #2::    pt has been to get the MRI - see results in another phone note Follow-up by: Lehman Prom FNP,  April 23, 2010 8:54 AM

## 2010-05-03 ENCOUNTER — Encounter (INDEPENDENT_AMBULATORY_CARE_PROVIDER_SITE_OTHER): Payer: Self-pay | Admitting: Nurse Practitioner

## 2010-05-08 NOTE — Letter (Signed)
Summary: *Referral Letter  [facility]  [address] [place], [place] [address]   Phone: [phone]  Fax: [phone]    05/02/2010  Cynthia Hess [address] [place], [place]  [address]  Phone: [phone]  To whom it may concern: [name] is an established patient in this office.  Her current medical history is noted below.  She recently had a MRI which shows some abnormalities for which she needs to go to see the specialist.  Lifting, bending, and excessive walking intensify her symptoms.  She should avoid these repeatitive activities if at all possible due to the possible progression of symptoms.   Current Medical Problems: 1)  DEPRESSION (ICD-311) 2)  TRANSAMINASES, SERUM, ELEVATED (ICD-790.4) 3)  WRIST PAIN, BILATERAL (ICD-719.43) 4)  TOBACCO ABUSE (ICD-305.1) 5)  CONSTIPATION (ICD-564.00) 6)  CHEST PAIN (ICD-786.50) 7)  COSTOCHONDRITIS (ICD-733.6) 8)  HERNIATED LUMBOSACRAL DISC (ICD-722.10) 9)  DEGENERATIVE JOINT DISEASE, LUMBAR SPINE (ICD-721.90) 10)  BACK PAIN, LUMBAR (ICD-724.2) 11)  SUBACROMIAL BURSITIS, LEFT (ICD-726.19) 12)  DEGENERATIVE DISC DISEASE, CERVICAL SPINE (ICD-722.4) 13)  LUMBAR STRAIN (ICD-847.2) 14)  VENTRAL HERNIA (ICD-553.20) 15)  NEPHROLITHIASIS (ICD-592.0) 16)  ESSENTIAL HYPERTENSION, BENIGN (ICD-401.1) 17)  PAIN IN SOFT TISSUES OF LIMB (ICD-729.5)     Please contact us if you have any further questions or need additional information.  Sincerely,   Lehman Prom FNP [facility]

## 2010-05-08 NOTE — Letter (Signed)
Summary: MAILED REQUESTED RECORDS TO [facility]  MAILED REQUESTED RECORDS TO [facility]   Imported By: Arta Bruce 05/03/2010 15:46:03  _____________________________________________________________________  External Attachment:    Type:   Image     Comment:   External Document

## 2010-05-08 NOTE — Progress Notes (Signed)
Summary: Needs note for medical leave  Phone Note Other Incoming   Summary of Call: PT WOULD LIKE Cynthia Hess TO WRITE HER A NOTE FOR MEDICAL LEAVE FOR HER LEG,SHE CAN'T WORK IN THAT MUCH PAIN/ APPLIED FOR MEDICAID BUT IT WILL TAKE UP TO 3 MONTHS TO GET/ SHE IS ALSO TRYING TO SAVE THE MONEY TO GO [employer]  PHONE [phone] Initial call taken by: Arta Bruce,  May 02, 2010 10:01 AM  Follow-up for Phone Call        Left message on answering machine for pt. to return call.  Dutch Quint RN  May 02, 2010 10:28 AM  Needs a note to be out of work starting today due to pain in her right leg.  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  Has appt. with you on 05/08/10. Follow-up by: Dutch Quint RN,  May 02, 2010 10:36 AM  Additional Follow-up for Phone Call Additional follow up Details #1::        I can't "write pt out of work". If it were for a defined about of time ie, 1 day, 1 week,etc then i could write a note for that defined period but I am unsure how long this will last. Will write letter with above indication - pt can read and decide what she wants to do with it Additional Follow-up by: Lehman Prom FNP,  May 02, 2010 5:43 PM    Additional Follow-up for Phone Call Additional follow up Details #2::    Pt. made aware -- verbalized understanding and agreement.  Confirmed address -- letter mailed.  Dutch Quint RN  May 03, 2010 10:11 AM   Prescriptions: VICODIN 5-500 MG TABS (HYDROCODONE-ACETAMINOPHEN) 1 tablet by mouth two times a day as needed for pain  #50 x 0   Entered and Authorized by:   Lehman Prom FNP   Signed by:   Lehman Prom FNP on 05/02/2010   Method used:   Printed then faxed to ...       [facility] [address] [phone]* (retail)       [address]       [place], [place]  [address]       Ph: [phone]       Fax: [phone]   RxID:   [id number]

## 2010-05-21 ENCOUNTER — Encounter (INDEPENDENT_AMBULATORY_CARE_PROVIDER_SITE_OTHER): Payer: Self-pay | Admitting: Nurse Practitioner

## 2010-05-21 ENCOUNTER — Telehealth (INDEPENDENT_AMBULATORY_CARE_PROVIDER_SITE_OTHER): Payer: Self-pay | Admitting: Nurse Practitioner

## 2010-05-21 ENCOUNTER — Encounter: Payer: Self-pay | Admitting: Nurse Practitioner

## 2010-05-21 ENCOUNTER — Other Ambulatory Visit: Payer: Self-pay | Admitting: Nurse Practitioner

## 2010-05-21 DIAGNOSIS — M545 Low back pain, unspecified: Secondary | ICD-10-CM

## 2010-05-22 ENCOUNTER — Ambulatory Visit
Admission: RE | Admit: 2010-05-22 | Discharge: 2010-05-22 | Disposition: A | Discharge status: Home / Self Care | Payer: No Typology Code available for payment source | Source: Ambulatory Visit | Attending: Nurse Practitioner | Admitting: Nurse Practitioner

## 2010-05-22 DIAGNOSIS — M545 Low back pain, unspecified: Secondary | ICD-10-CM

## 2010-05-28 NOTE — Progress Notes (Signed)
Summary: RADIOLOGY ORDER   Phone Note Outgoing Call   Summary of Call: PT HAS AN APPT 05-23-10 @ 1:45PM [facility] [facility] PT AWARE OF HER APPT  Initial call taken by: Cheryll Dessert,  May 21, 2010 2:43 PM  Follow-up for Phone Call        GREAT! Follow-up by: Lehman Prom FNP,  May 21, 2010 3:49 PM

## 2010-05-28 NOTE — Assessment & Plan Note (Signed)
Summary: Back Pain   Vital Signs:  Patient profile:   55 year old female Menstrual status:  hysterectomy Weight:      163.0 pounds BMI:     29.92 Temp:     97.8 degrees F oral Pulse rate:   88 / minute Pulse rhythm:   regular Resp:     20 per minute BP sitting:   130 / 88  (left arm) Cuff size:   regular  Vitals Entered By: [name] (May 21, 2010 12:43 PM)  Nutrition Counseling: Patient's BMI is greater than 25 and therefore counseled on weight management options. CC: right leg and back popping with daily activities x 1 month, Hypertension Management, Back Pain Is Patient Diabetic? No Pain Assessment Patient in pain? no       Does patient need assistance? Functional Status Self care Ambulation Normal   CC:  right leg and back popping with daily activities x 1 month, Hypertension Management, and Back Pain.  History of Present Illness:  Pt into the office to f/u on back pain. Recent MRI done pt report that pain in back has intensified.  She is still working but the bending at work is making her cringe in pain. Pain medication is not as effective as once was. Cymbalta at 30mg  by mouth daily was some effective when initially started  Back Pain History:      The patient's back pain has been present for > 6 weeks.  The pain is located in the lower back region and does radiate below the knees.  She states that she has had a prior history of back pain.  The patient has had a recent course of physical therapy.        Description of injury in patient's own words:  Recent MRI.    Hypertension History:      She denies headache, chest pain, and palpitations.  She notes no problems with any antihypertensive medication side effects.        Positive major cardiovascular risk factors include hypertension and current tobacco user.  Negative major cardiovascular risk factors include female age less than 20 years old and negative family history for ischemic heart disease.        Further assessment for target organ damage reveals no history of ASHD, cardiac end-organ damage (CHF/LVH), stroke/TIA, peripheral vascular disease, renal insufficiency, or hypertensive retinopathy.      Allergies (verified): No Known Drug Allergies  Review of Systems General:  Denies fever. CV:  Denies chest pain or discomfort. Resp:  Denies cough. GI:  Denies abdominal pain, nausea, and vomiting. MS:  Complains of low back pain. Neuro:  Complains of tingling; right foot.  Physical Exam  General:  alert.   Head:  normocephalic.   Abdomen:  normal bowel sounds.   Neurologic:  alert & oriented X3.    Low Back Pain Physical Exam:    Inspection-deformity:     Yes    Palpation-spinal tenderness:   Yes   Detailed Back/Spine Exam  Lumbosacral Exam:  Inspection-deformity:    Abnormal Palpation-spinal tenderness:  Abnormal    Location:  L4-L5 Sitting Straight Leg Raise:    Right:  positive   Impression & Recommendations:  Problem # 1:  BACK PAIN, LUMBAR (ICD-724.2) pt needs referral to ortho pt has continue back pain - will see if pt can get injection at intervention radiology Her updated medication list for this problem includes:    Vicodin 5-500 Mg Tabs (Hydrocodone-acetaminophen) ..... One tablet by mouth  three times a day as needed for pain    Ibuprofen 800 Mg Tabs (Ibuprofen) ..... One tablet by mouth two times a day as needed for pain  Orders: Radiology other (Radiology Other)  Problem # 2:  ESSENTIAL HYPERTENSION, BENIGN (ICD-401.1) BP is stable Her updated medication list for this problem includes:    Lisinopril 20 Mg Tabs (Lisinopril) ..... One tablet by mouth daily for blood pressure  Problem # 3:  TOBACCO ABUSE (ICD-305.1)  Complete Medication List: 1)  Lisinopril 20 Mg Tabs (Lisinopril) .... One tablet by mouth daily for blood pressure 2)  Vicodin 5-500 Mg Tabs (Hydrocodone-acetaminophen) .... One tablet by mouth three times a day as needed for  pain 3)  Calcium 600/vitamin D 600-400 Mg-unit Tabs (Calcium carbonate-vitamin d) .... One tablet by mouth two times a day for bones 4)  Loratadine 10 Mg Tabs (Loratadine) .... One tablet by mouth daily for allergies 5)  Cymbalta 30 Mg Cpep (Duloxetine hcl) .... Two capsules by mouth daily  **note change in dose** 6)  Colace 100 Mg Caps (Docusate sodium) .... One capsule by mouth two times a day for stools 7)  Ibuprofen 800 Mg Tabs (Ibuprofen) .... One tablet by mouth two times a day as needed for pain  Hypertension Assessment/Plan:      The patient's hypertensive risk group is category B: At least one risk factor (excluding diabetes) with no target organ damage.  Her calculated 10 year risk of coronary heart disease is 11 %.  Today's blood pressure is 130/88.  Her blood pressure goal is < 140/90.  Patient Instructions: 1)  This office will schedule you an appointment with interventional radiology for injection.  Wait to see when you get this appointment.  if this week or next week will not give oral steroids.  If the appointment is 3-4 weeks away then will call in medication to [facility]. 2)  Cymbalta will increased to 60mg  - take 20mg  (2 capsules) daily 3)  Pain medication - increase vicodin to 75 tablets by mouth per month Prescriptions: IBUPROFEN 800 MG TABS (IBUPROFEN) ONe tablet by mouth two times a day as needed for pain  #60 x 1   Entered and Authorized by:   Lehman Prom FNP   Signed by:   Lehman Prom FNP on 05/21/2010   Method used:   Print then Give to Patient   RxID:   [id number] VICODIN 5-500 MG TABS (HYDROCODONE-ACETAMINOPHEN) One tablet by mouth three times a day as needed for pain  #75 x 0   Entered and Authorized by:   Lehman Prom FNP   Signed by:   Lehman Prom FNP on 05/21/2010   Method used:   Print then Give to Patient   RxID:   [id number] CYMBALTA 30 MG CPEP (DULOXETINE HCL) TWO capsules by mouth daily  **note change in dose**  #60 x 11    Entered and Authorized by:   Lehman Prom FNP   Signed by:   Lehman Prom FNP on 05/21/2010   Method used:   Faxed to ...       [facility] (retail)       [address] [place], [place]  [address]       Ph: [phone]       Fax: [phone]   RxID:   [id number]    Orders Added: 1)  Est. Patient Level III [564[address]] 2)  Radiology other [Radiology Other]

## 2010-06-08 ENCOUNTER — Encounter (INDEPENDENT_AMBULATORY_CARE_PROVIDER_SITE_OTHER): Payer: Self-pay | Admitting: Nurse Practitioner

## 2010-07-02 NOTE — Letter (Signed)
Summary: PSYCHIATRIC CONSULTATION  PSYCHIATRIC CONSULTATION   Imported By: Arta Bruce 06/26/2010 15:25:27  _____________________________________________________________________  External Attachment:    Type:   Image     Comment:   External Document

## 2010-07-08 LAB — DIFFERENTIAL
Basophils Absolute: 0 10*3/uL (ref 0.0–0.1)
Basophils Relative: 1 % (ref 0–1)
Basophils Relative: 1 % (ref 0–1)
Eosinophils Absolute: 0.1 10*3/uL (ref 0.0–0.7)
Lymphocytes Relative: 32 % (ref 12–46)
Lymphs Abs: 2.4 10*3/uL (ref 0.7–4.0)
Monocytes Absolute: 0.5 10*3/uL (ref 0.1–1.0)
Monocytes Relative: 7 % (ref 3–12)
Neutro Abs: 4.4 10*3/uL (ref 1.7–7.7)
Neutrophils Relative %: 52 % (ref 43–77)
Neutrophils Relative %: 59 % (ref 43–77)

## 2010-07-08 LAB — LIPID PANEL
Cholesterol: 179 mg/dL (ref 0–200)
HDL: 61 mg/dL (ref >39)
Total CHOL/HDL Ratio: 2.9 RATIO
VLDL: 17 mg/dL (ref 0–40)

## 2010-07-08 LAB — CARDIAC PANEL(CRET KIN+CKTOT+MB+TROPI)
CK, MB: 1.4 ng/mL (ref 0.3–4.0)
Relative Index: INVALID (ref 0.0–2.5)
Relative Index: INVALID (ref 0.0–2.5)
Total CK: 96 U/L (ref 7–177)
Troponin I: 0.01 ng/mL (ref 0.00–0.06)
Troponin I: 0.01 ng/mL (ref 0.00–0.06)

## 2010-07-08 LAB — BASIC METABOLIC PANEL
BUN: 12 mg/dL (ref 6–23)
CO2: 22 mEq/L (ref 19–32)
CO2: 26 mEq/L (ref 19–32)
Calcium: 8.8 mg/dL (ref 8.4–10.5)
Calcium: 9.3 mg/dL (ref 8.4–10.5)
Chloride: 102 mEq/L (ref 96–112)
Chloride: 106 mEq/L (ref 96–112)
Creatinine, Ser: 0.76 mg/dL (ref 0.4–1.2)
GFR calc Af Amer: >60 mL/min (ref >60)
Sodium: 138 mEq/L (ref 135–145)

## 2010-07-08 LAB — HEPATIC FUNCTION PANEL
ALT: 18 U/L (ref 0–35)
Bilirubin, Direct: 0.2 mg/dL (ref 0.0–0.3)
Indirect Bilirubin: 0.6 mg/dL (ref 0.3–0.9)
Total Protein: 6.7 g/dL (ref 6.0–8.3)

## 2010-07-08 LAB — POCT CARDIAC MARKERS
CKMB, poc: 1.3 ng/mL (ref 1.0–8.0)
Myoglobin, poc: 87.4 ng/mL (ref 12–200)

## 2010-07-08 LAB — GLUCOSE, CAPILLARY
Glucose-Capillary: 103 mg/dL — ABNORMAL HIGH (ref 70–99)
Glucose-Capillary: 109 mg/dL — ABNORMAL HIGH (ref 70–99)
Glucose-Capillary: 128 mg/dL — ABNORMAL HIGH (ref 70–99)
Glucose-Capillary: 89 mg/dL (ref 70–99)

## 2010-07-08 LAB — VITAMIN B12: Vitamin B-12: 250 pg/mL (ref 211–911)

## 2010-07-08 LAB — CBC
Hemoglobin: 13.7 g/dL (ref 12.0–15.0)
MCHC: 34.3 g/dL (ref 30.0–36.0)
MCHC: 34.8 g/dL (ref 30.0–36.0)
MCV: 100.8 fL — ABNORMAL HIGH (ref 78.0–100.0)
Platelets: 369 10*3/uL (ref 150–400)
RBC: 3.93 MIL/uL (ref 3.87–5.11)
WBC: 7.5 10*3/uL (ref 4.0–10.5)

## 2010-07-08 LAB — METHYLMALONIC ACID, SERUM: Methylmalonic Acid, Quantitative: 155 nmol/L (ref 87–318)

## 2010-07-08 LAB — FOLATE RBC: RBC Folate: 426 ng/mL (ref 180–600)

## 2010-08-13 NOTE — Discharge Summary (Signed)
Cynthia Hess, Cynthia Hess                 ACCOUNT NO.:  [id number]   MEDICAL RECORD NO.:  [id number]          PATIENT TYPE:  INP   LOCATION:  3731                         FACILITY:  [facility]   PHYSICIAN:  Waldemar Dickens, MD     DATE OF BIRTH:  [birth year]   DATE OF ADMISSION:  09/01/2008  DATE OF DISCHARGE:  09/02/2008                               DISCHARGE SUMMARY   DISCHARGE DIAGNOSES:  1. Chest pain.  2. Macrocytosis.  3. Hypertension.  4. History of tobacco abuse.  5. Chronic back pain.   DISCHARGE MEDICATIONS:  Lisinopril 10 mg 1 tablet p.o. daily.   CONDITION ON DISCHARGE:  After evaluating chest pain, workup has been  negative.  The patient is medically stable to be discharged.  On morning  of discharge, patient has denied any symptoms of chest pain.  The  patient today did feel 100% better.  The patient will follow up with  [facility] with Dr. Daphine Deutscher on September 04, 2008, at 11:15 in the morning.  At that time, MMA should be evaluated as labs pending when the patient  was discharged.  Follow up also fasting lipid profile done before the  patient left the hospital.  The patient should have a Myoview done for  further assessment.   PROCEDURES:  Chest x-ray.   IMPRESSION:  Minimal bibasilar atelectasis.   ADMITTING HISTORY AND PHYSICAL/HISTORY OF PRESENT ILLNESS:  A 55-year-  old woman with past medical history of hypertension, comes to the ED  complaining of chest pain.  First episode occurred about 2 weeks ago  while she was cleaning her dishes.  Chest pain is located at substernal  area, pressure-like, radiates to the left under arm and last for around  10 minutes.  Associated with diaphoresis, shortness of breath,  lightheadedness.  The patient had an episode in the morning of admission  while she was bending over to get something from the floor.  The patient  had never had these symptoms before.  Symptoms are alleviated by rest.  The patient denies chest pain associated with  spicy food.  The patient  has been coughing recently and chest pain recently, reducible on  palpation.   PHYSICAL EXAMINATION:  VITAL SIGNS:  Temperature 98, blood pressure  140/86, pulse 84, respiratory rate 18, and O2 saturation 100 on room  air.  GENERAL:  NAD.  HEENT:  Eyes:  EOMI.  ENT:  Moist mucous membranes.  NECK:  No JVD, no carotid bruits.  RESPIRATORY:  Clear to auscultation bilaterally.  No wheezes, rubs, or  crackles.  CARDIOVASCULAR:  Regular rate and rhythm.  No murmurs, rubs, or gallops.  Pain on palpation of left chest and sternal area, mild and worse with  adduction of left upper arm across the chest.  GI:  Bowel sounds positive.  Abdominal scars, old ventral hernia,  nondistended.  No tenderness on palpation.  No guarding.  No rebound.  EXTREMITIES:  No edema.  MUSCULOSKELETAL:  Strength 5/5 throughout.  NEUROLOGIC:  Nonfocal.   ADMISSION LABORATORY DATA:  Sodium 138, potassium 3.8, chloride 106,  bicarb 22, BUN 16,  and creatinine 0.68.  White blood cells 7.5,  hemoglobin 14, hematocrit 40, platelets 435, and MCV 101.4.   HOSPITAL COURSE:  1. Chest pain.  Due to concern for ACS and the patient with risk      factors that include hypertension, smoking, and unknown lipid      levels.  The patient was admitted for further workup.  Cardiac      enzymes have been negative x3.  EKG shows an isolated Q-wave in      III, T-wave inversion in III, and biphasic T and V1, then on serial      EKGs did not change.  The patient responded well to medical      treatment while in the hospital.  Possibly, chest pain related to      GI source.  The patient will be told to continue omeprazole as an      outpatient.  The patient should follow up at [facility] with Dr.      Daphine Deutscher on September 04, 2008, as stated above.  At that time, the patient      will need scheduling for a Myoview to further assess.  2. Macrocytosis, most likely secondary to vitamin B12 deficiency.  We      will  check an MMA level before the patient leaves the hospital.      Primary care physician recommended to follow up on results and      reassess for further management if needed.  3. Cough, may be due to smoking versus viral upper respiratory tract      infection.  We will have the patient continue symptomatic treatment      with over-the-counter cough antisuppressants as indicated.  The      patient will follow up with PCP for further management.  4. Tobacco abuse.  The patient was educated on benefits of quitting      smoking.  We will continue smoking cessation recommendations as an      outpatient.  5. Hypertension, stable throughout hospitalization.  The patient will      be discharged on home dose medication.  Further titration of      medication will be done as an outpatient.   DISCHARGE LABORATORIES:  Sodium 135, potassium 3.9, chloride 102, bicarb  26, BUN 12, creatinine 0.76, and glucose 77.  White blood cells 7,  hemoglobin 12.8, hematocrit 36.6, and platelet 369.   DISCHARGE VITAL SIGNS:  Temperature 97.7, blood pressure 114/66, pulse  66, respiratory rate 16, and O2 sats 98 on 2 L.   PENDING LABORATORIES:  Methylmalonic acid level and fasting lipid  profile.      Danne Harbor, MD  Electronically Signed      Waldemar Dickens, MD     RV/MEDQ  D:  09/02/2008  T:  09/03/2008  Job:  [id number]   cc:   Dr. Daphine Deutscher

## 2010-08-16 NOTE — H&P (Signed)
Cynthia Hess, Cynthia Hess                             ACCOUNT NO.:  [id number]   MEDICAL RECORD NO.:  [id number]                   PATIENT TYPE:  EMS   LOCATION:  ED                                   FACILITY:  [facility]   PHYSICIAN:  Anselm Pancoast. Zachery Dakins, M.D.          DATE OF BIRTH:  [birth year]   DATE OF ADMISSION:  07/04/2003  DATE OF DISCHARGE:                                HISTORY & PHYSICAL   Cynthia Hess is a 55 year old white female, who presented to the emergency  room after being seen earlier this morning in the office with complaint of a  knot in midline incision for approximately a week,and it was painful.  The  patient had some type of GYN procedure years ago __________  incision and  stated that she would have pain in this area for approximately a week  __________ .   On physical exam, her abdomen is soft but with her lying flat when I pushed  on the tummy, there obviously a little bulge that is painful right above the  umbilicus.  Dr. Beverely Pace asked me to see her after __________ febrile.  On  physical exam, she is certainly __________ .  Her symptoms really do not  sound like those of an early appendicitis or a gallbladder problem  __________  lump, and I recommended that we get an acute abdomen series and  see __________  normal and if they were, then we would proceed on with  repair of this little umbilical __________ .  The x-rays were obtained and  showed a nonspecific x-ray.  Her CBC was normal.  White count was 6700  __________ .  On abdominal exam, I could find no evidence of any tenderness  __________  bulge.   PAST MEDICAL HISTORY:  The patient does not know exactly __________  surgery  __________ .  She thought __________ .  She works __________ .  She is not  on any chronic medications.  __________ .   PHYSICAL EXAMINATION:  VITAL SIGNS:  Temperature 97.0, blood pressure  141/__________ 5, __________  pulse, respirations 15.  EYES/EARS/NOSE/THROAT:  Eyes, ears, nose  negative.  Appears well-hydrated.  LUNGS:  She has good breath sounds __________ .  CARDIAC:  Normal sinus rhythm.  __________ .  ABDOMEN:  She is not acutely tender  __________  quadrant __________  bulge  about the size of a walnut just above the umbilicus, tender with pressure on  it.  In the lower abdomen, I do not appreciate any fascial defect in this  lower midline incision.  RECTAL:  I did not actually do a rectal exam __________ .  EXTREMITIES:  Good peripheral pulses, no pedal edema.  __________ .   X-RAYS:  Acute abdomen series was negative.  Chest was negative.   LABORATORY DATA:  Her white blood count is normal, normal hematocrit  __________ .  CMET:  All numbers were normal.  Total bilirubin and liver  function studies __________ .   ADMISSION IMPRESSION:  Supraumbilical hernia, status post GYN lower  abdominal incision, __________ , has a small supraumbilical hernia.  Will  undergo repair with general anesthesia later this afternoon, 1 g of Kefzol  __________  and __________ .                                              Anselm Pancoast. Zachery Dakins, M.D.   WJW/MEDQ  D:  07/04/2003  T:  07/05/2003  Job:  [id number]

## 2010-08-16 NOTE — Op Note (Signed)
[name], [name]                 ACCOUNT NO.:  [id number]   MEDICAL RECORD NO.:  [id number]          PATIENT TYPE:  AMB   LOCATION:  DSC                          FACILITY:  [facility]   PHYSICIAN:  Anselm Pancoast. Weatherly, M.D.DATE OF BIRTH:  [birth year]   DATE OF PROCEDURE:  08/14/2005  DATE OF DISCHARGE:                                 OPERATIVE REPORT   PREOPERATIVE DIAGNOSIS:  Large ventral hernia.   POSTOPERATIVE DIAGNOSIS:  Not given.   OPERATION:  Repair of ventral hernia with mesh. She has had a previous  umbilical hernia.   ANESTHESIA:  General.   SURGEON:  Anselm Pancoast. Zachery Dakins, M.D.   HISTORY:  Cynthia Hess is a 55 year old female who was seen in the office  approximately a month ago by Dr. Earlene Plater after she presented with a large mass  in the epigastric area. She had had a previous small umbilical hernia really  right above the umbilicus repaired when I was on-call at [facility]  approximately a little over 2 years ago and had not appeared to have any  problems until the last several months when she has now developed a good  lemon size mass in the epigastric area back kind of in the same area but  above where the previous defect had been. I had not seen her in the office  but he scheduled her for me to do and she presented today over at [facility]  where she was scheduled but because of financial committments and etc. they  wanted her done over at [facility] and she is here. She has no  evidence of any hypertension or chronic medical problems and I think that we  can repair this as an outpatient. I do think that she needs mesh in the  preperitoneal space and a piece of Prolene mesh about 3 x 5 inches were  ultimately placed.   Preop, she was given a gram of Kefzol, positioned on the OR table, induction  of general anesthesia and then the incision opened. I opened up the old  incision and went about an inch and a half further and then the large hernia  sac that  contained colon was dissected free circumferentially. The  underlying adhesions were divided after I opened the hernia sac and then the  colon was placed back in its normal position, the peritoneum closed and then  I freed up the peritoneum circumferentially so a piece of mesh about 3 x 5  inches could be placed in the preperitoneal space. This was anchored in all  four corners and stitches in between with either #1 Novofil or #0 Prolene  and then after the mesh was laying flat the mesh is not in contact with any  of the bowel. There is not a lot of omentum but we were extraperitoneally.  Next, the fascia was closed with interrupted sutures of #0 Prolene kind of  incorporating the mid portion of the mesh in the stitch. The subcutaneous  tissue was closed with 4-0 Vicryl. I did place Marcaine in the fascia about  20 mL 0.5%  with adrenaline for immediate postop pain management. The skin  was closed with staples and we placed an abdominal binder. We will wait and  see if she is nauseated, if she is she might need to stay  overnight, if she is not she should be able to be discharged later this  afternoon on kind of a liquid diet with Tylox for incisional pain. She will  need to wear the abdominal binder for at least a couple of weeks and I will  see her back in the office next week.           ______________________________  Anselm Pancoast. Zachery Dakins, M.D.     WJW/MEDQ  D:  08/14/2005  T:  08/15/2005  Job:  [id number]

## 2010-08-16 NOTE — Op Note (Signed)
NAMEMANNA, GOSE                             ACCOUNT NO.:  [id number]   MEDICAL RECORD NO.:  [id number]                   PATIENT TYPE:  EMS   LOCATION:  ED                                   FACILITY:  [facility]   PHYSICIAN:  Anselm Pancoast. Zachery Dakins, M.D.          DATE OF BIRTH:  [birth year]   DATE OF PROCEDURE:  07/04/2003  DATE OF DISCHARGE:                                 OPERATIVE REPORT   PREOPERATIVE DIAGNOSES:  Umbilical hernia.   POSTOPERATIVE DIAGNOSES:  Umbilical hernia.   OPERATION:  Repair of umbilical/incisional hernia.   ANESTHESIA:  General.   HISTORY:  Cynthia Hess is a 55 year old female who presented to the emergency  room here after being seen in the emergency room at [facility] with a  history of knot just above my umbilicus.  The patient has had a previous  lower abdominal incision for some type of GYN surgery. The incision kind of  curves around the umbilicus to the left.  Recently, she has noticed a little  bulge that extended right above the umbilicus.  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  She has  had this area _____________ for about a week, it appears to be getting  larger.  On physical exam, she is sitting up and kind of leaning over  _____________.  When she lies flat there is a definite little area about the  size of a walnut right at the top aspect of her incision that is  ___________.  Her laboratory studies show a normal white count of _________  ___________, acute abdomen series was unremarkable and her urinalysis was  ___________.  Her pain is all right at the bulge not really like a  generalized problem with normal x-ray.  ________ small umbilical/incisional  hernia _________ anesthesia. The patient was in agreement, she was given a  gram of Kefzol and taken to the operative suite.  I discussed with her  preoperatively about the possible use of vaginal _________ but this defect  was small  enough it would probably not be __________.   Induction of general anesthesia after the abdomen had been prepped with  Betadine solution, draped in a sterile manner.  A small midline just above  the umbilicus about an inch and a half to two inches in length was made,  sharp dissection down upon this little fatty bulge and there was definitely  a little herniation of the preperitoneal fat through the fascia ____________  fascial defect __________.  The little area was separated from the _________  herniated fat was separated carefully and a couple of little areas were  ligated with 3-0 Vicryl and then I extended the incision just a little  longer so I could peak into the ___________.  I then placed my finger in the  fascia from the underside and could not get the defect at the umbilicus  _________defect above that and I could also see through the little  peritoneum and note that small bowel looks unremarkable __________.  With  this, I elected to close the area with interrupted #0 Prolene sutures  ____________ extended it down to the top part and this kind of overlaps the  previous incision ___________ stitch had been tied with some type of  absorbable stitch, running stitch ____________  just above the area.  I put  the fascial stitches probably about 2 cm more superiorly in the  _____________ and then used Marcaine to anesthetize the fascia after I  placed the stitches in the proximal top and bottom before they were  actually tied. The subcutaneous tissue was then closed with interrupted  sutures of 3-0 Vicryl, 4-0 Dexon subcuticular and ___________ Steri-Strips  on the skin.  The patient tolerated the procedure nicely.  ___________.  I  discussed with the son ____________ really ought to be out of work this week  and next week, ____________.                                               Anselm Pancoast. Zachery Dakins, M.D.    WJW/MEDQ  D:  07/04/2003  T:  07/04/2003  Job:  [id number]

## 2010-11-11 ENCOUNTER — Emergency Department (HOSPITAL_COMMUNITY)
Admission: EM | Admit: 2010-11-11 | Discharge: 2010-11-11 | Disposition: A | Discharge status: Home / Self Care | Payer: Self-pay | Attending: Emergency Medicine | Admitting: Emergency Medicine

## 2010-11-11 DIAGNOSIS — M542 Cervicalgia: Secondary | ICD-10-CM | POA: Insufficient documentation

## 2010-11-11 DIAGNOSIS — I10 Essential (primary) hypertension: Secondary | ICD-10-CM | POA: Insufficient documentation

## 2011-02-06 ENCOUNTER — Other Ambulatory Visit: Payer: Self-pay | Admitting: Internal Medicine

## 2011-02-06 DIAGNOSIS — M542 Cervicalgia: Secondary | ICD-10-CM

## 2011-02-10 ENCOUNTER — Inpatient Hospital Stay (HOSPITAL_COMMUNITY): Admission: RE | Admit: 2011-02-10 | Payer: No Typology Code available for payment source | Source: Ambulatory Visit

## 2011-02-17 ENCOUNTER — Ambulatory Visit (HOSPITAL_COMMUNITY)
Admission: RE | Admit: 2011-02-17 | Discharge: 2011-02-17 | Disposition: A | Discharge status: Home / Self Care | Payer: Self-pay | Source: Ambulatory Visit | Attending: Internal Medicine | Admitting: Internal Medicine

## 2011-02-17 DIAGNOSIS — E049 Nontoxic goiter, unspecified: Secondary | ICD-10-CM | POA: Insufficient documentation

## 2011-02-17 DIAGNOSIS — M542 Cervicalgia: Secondary | ICD-10-CM | POA: Insufficient documentation

## 2011-02-17 MED ORDER — IOHEXOL 300 MG/ML  SOLN
75.0000 mL | Freq: Once | INTRAMUSCULAR | Status: AC | PRN
  Administered 2011-02-17: 75 mL via INTRAVENOUS

## 2011-04-01 DIAGNOSIS — E785 Hyperlipidemia, unspecified: Secondary | ICD-10-CM

## 2011-04-01 HISTORY — DX: Hyperlipidemia, unspecified: E78.5

## 2011-05-27 ENCOUNTER — Ambulatory Visit: Payer: Self-pay | Admitting: Physical Medicine & Rehabilitation

## 2011-10-03 ENCOUNTER — Encounter: Payer: Self-pay | Attending: Physical Medicine & Rehabilitation

## 2011-10-03 ENCOUNTER — Encounter: Payer: Self-pay | Admitting: Physical Medicine & Rehabilitation

## 2011-10-03 ENCOUNTER — Ambulatory Visit: Payer: Self-pay | Admitting: Physical Medicine & Rehabilitation

## 2011-10-03 ENCOUNTER — Ambulatory Visit (HOSPITAL_BASED_OUTPATIENT_CLINIC_OR_DEPARTMENT_OTHER): Payer: Self-pay | Admitting: Physical Medicine & Rehabilitation

## 2011-10-03 VITALS — BP 136/68 | HR 64 | Resp 16 | Ht 62.0 in | Wt 155.0 lb

## 2011-10-03 DIAGNOSIS — M47817 Spondylosis without myelopathy or radiculopathy, lumbosacral region: Secondary | ICD-10-CM

## 2011-10-03 DIAGNOSIS — R209 Unspecified disturbances of skin sensation: Secondary | ICD-10-CM | POA: Insufficient documentation

## 2011-10-03 DIAGNOSIS — M5430 Sciatica, unspecified side: Secondary | ICD-10-CM

## 2011-10-03 DIAGNOSIS — M549 Dorsalgia, unspecified: Secondary | ICD-10-CM | POA: Insufficient documentation

## 2011-10-03 DIAGNOSIS — M79609 Pain in unspecified limb: Secondary | ICD-10-CM | POA: Insufficient documentation

## 2011-10-03 HISTORY — DX: Sciatica, unspecified side: M54.30

## 2011-10-03 HISTORY — DX: Spondylosis without myelopathy or radiculopathy, lumbosacral region: M47.817

## 2011-10-03 MED ORDER — CYCLOBENZAPRINE HCL 10 MG PO TABS: 10.0000 mg | ORAL_TABLET | Freq: Every day | ORAL | Status: AC

## 2011-10-03 NOTE — Addendum Note (Signed)
Addended by: Caryl Ada on: 10/03/2011 11:36 AM   Modules accepted: Orders

## 2011-10-03 NOTE — Patient Instructions (Signed)
You'll take the cyclobenzaprine at night Continue tramadol up to 8 tablets per day Continue ibuprofen 800 mg up to 4 tablets per day with food Your next visit will be for lumbar medial branch blocks with me. In the meantime he'll go to physical therapy for 4 visits to instruct on a exercise program

## 2011-10-03 NOTE — Progress Notes (Addendum)
Subjective:    Patient ID: Cynthia Hess, female    DOB: Sep 11, 1955, 56 y.o.   MRN: [id number]  HPI Onset of back pain after a fall on kitchen floor at home 2010.  R leg pain started in 2011. Numbness and tingling in toes on the Right.  Pain worsening with time.  Pain is now constant rather than with ambulation.    Pain Inventory Average Pain 10 Pain Right Now 10 My pain is constant, burning and aching  In the last 24 hours, has pain interfered with the following? General activity 10 Relation with others 10 Enjoyment of life 10 What TIME of day is your pain at its worst? all of the  time Sleep (in general) Poor  Pain is worse with: walking, bending, sitting, standing and some activites Pain improves with: medication and injections Relief from Meds: 5  Mobility walk without assistance use a cane how many minutes can you walk? 10 ability to climb steps?  yes do you drive?  no  Function not employed: date last employed 2010 disabled: date disabled in process  Neuro/Psych bladder control problems numbness trouble walking depression  Prior Studies Any changes since last visit?  no  Physicians involved in your care Any changes since last visit?  no IMPRESSION: April 22, 2010 MRI LS Spine 1. New L5-S1 right foraminal stenosis associated with a small  synovial cyst from the degenerated right L5-S1 facet joint. Severe  right and moderate left L5-S1 facet arthritis.  2. Unchanged L4-L5 bilateral facet arthrosis with grade 1  anterolisthesis and mild symmetric bilateral foraminal stenosis.    History reviewed. No pertinent family history. History   Social History  . Marital Status: Single    Spouse Name: N/A    Number of Children: N/A  . Years of Education: N/A   Social History Main Topics  . Smoking status: Current Everyday Smoker -- 0.4 packs/day    Types: Cigarettes  . Smokeless tobacco: Never Used   Comment: cutting back  . Alcohol Use: None  . Drug Use:  None  . Sexually Active: None   Other Topics Concern  . None   Social History Narrative  . None   Past Surgical History  Procedure Date  . Hernia repair   . Abdominal hysterectomy   . Small intestine surgery   . Bladder surgery    Past Medical History  Diagnosis Date  . Arthritis   . Hypertension    BP 136/68  Pulse 64  Resp 16  Ht 5\' 2"  (1.575 m)  Wt 155 lb (70.308 kg)  BMI 28.35 kg/m2  SpO2 100%   Review of Systems  Constitutional: Positive for diaphoresis, appetite change and unexpected weight change.  Gastrointestinal: Positive for constipation.  Musculoskeletal: Positive for gait problem.  Neurological: Positive for numbness.  Psychiatric/Behavioral: Positive for dysphoric mood.  All other systems reviewed and are negative.       Objective:   Physical Exam  Constitutional: She is oriented to person, place, and time. She appears well-developed and well-nourished.  Musculoskeletal:       Lumbar back: She exhibits decreased range of motion, tenderness and pain. She exhibits no swelling, no edema and no deformity.       Negative straight leg raising test  Neurological: She is alert and oriented to person, place, and time. She has normal strength. A sensory deficit is present. She exhibits abnormal muscle tone. Gait normal.  Reflex Scores:      Tricep reflexes are 2+  on the right side and 2+ on the left side.      Bicep reflexes are 2+ on the right side and 2+ on the left side.      Brachioradialis reflexes are 2+ on the right side and 2+ on the left side.      Patellar reflexes are 2+ on the right side and 2+ on the left side.      Achilles reflexes are 2+ on the right side and 2+ on the left side.      Able to toe and heel walk no evidence of foot drop  Psychiatric: She has a normal mood and affect.          Assessment & Plan:  1. Lumbar spondylosis this is her main pain complaint. I do think she would benefit from medial branch blocks. If this only  provide short-term relief of 50% or more than we would repeat them. If this also provides only a short-term relief and we can set her up for radiofrequency neurotomy. 2. Sciatica. Imaging studies suggest L5 nerve root involved by foraminal stenosis at L5-S1 on the right side. Her sensory exam has more diffuse numbness. At this point I will not do an epidural see how she does with the above. May benefit from a transforaminal epidural at L5-S1. May benefit from gabapentin. In terms of medication management agree with tramadol and ibuprofen. Will add Flexeril at night. Consider gabapentin Physical therapy to teach stabilization exercises  UDS positive for codeine, butalbital, ethanol. Nonnarcotic treatment only

## 2011-10-15 ENCOUNTER — Telehealth: Payer: Self-pay | Admitting: *Deleted

## 2011-10-15 NOTE — Telephone Encounter (Signed)
Inconsistent UDS. Positive for several medications that she didn't report to [place] and ETOH. Tramadol was also negative.  I called the pt to get an explaintation about the medications and ETOH in her system and she states that she had run out of Tramadol and took some of her cousins medication. States that she told Dr. Wynn Banker this when she was here for her appointment. I advised her that more than likely we will treat her with non-narcotic medication. She understood.

## 2011-10-21 ENCOUNTER — Ambulatory Visit: Payer: Self-pay

## 2011-11-03 ENCOUNTER — Ambulatory Visit: Payer: Medicare Other | Attending: Physical Medicine & Rehabilitation | Admitting: Physical Therapy

## 2011-11-13 ENCOUNTER — Encounter: Payer: Medicare Other | Attending: Physical Medicine & Rehabilitation

## 2011-11-13 ENCOUNTER — Ambulatory Visit: Payer: Self-pay | Admitting: Physical Medicine & Rehabilitation

## 2011-11-13 DIAGNOSIS — M549 Dorsalgia, unspecified: Secondary | ICD-10-CM | POA: Insufficient documentation

## 2011-11-13 DIAGNOSIS — M5430 Sciatica, unspecified side: Secondary | ICD-10-CM | POA: Insufficient documentation

## 2011-11-13 DIAGNOSIS — R209 Unspecified disturbances of skin sensation: Secondary | ICD-10-CM | POA: Insufficient documentation

## 2011-11-13 DIAGNOSIS — M47817 Spondylosis without myelopathy or radiculopathy, lumbosacral region: Secondary | ICD-10-CM | POA: Insufficient documentation

## 2011-11-13 DIAGNOSIS — M79609 Pain in unspecified limb: Secondary | ICD-10-CM | POA: Insufficient documentation

## 2011-11-20 ENCOUNTER — Ambulatory Visit: Payer: Medicare Other | Admitting: Rehabilitative and Restorative Service Providers"

## 2011-12-02 ENCOUNTER — Ambulatory Visit: Payer: Self-pay | Admitting: Physical Therapy

## 2011-12-04 ENCOUNTER — Ambulatory Visit: Payer: Self-pay | Admitting: Physical Medicine & Rehabilitation

## 2011-12-04 ENCOUNTER — Encounter: Payer: Medicare Other | Attending: Physical Medicine & Rehabilitation

## 2011-12-04 DIAGNOSIS — M79609 Pain in unspecified limb: Secondary | ICD-10-CM | POA: Insufficient documentation

## 2011-12-04 DIAGNOSIS — M549 Dorsalgia, unspecified: Secondary | ICD-10-CM | POA: Insufficient documentation

## 2011-12-04 DIAGNOSIS — R209 Unspecified disturbances of skin sensation: Secondary | ICD-10-CM | POA: Insufficient documentation

## 2011-12-04 DIAGNOSIS — M47817 Spondylosis without myelopathy or radiculopathy, lumbosacral region: Secondary | ICD-10-CM | POA: Insufficient documentation

## 2011-12-04 DIAGNOSIS — M5430 Sciatica, unspecified side: Secondary | ICD-10-CM | POA: Insufficient documentation

## 2011-12-21 ENCOUNTER — Emergency Department (HOSPITAL_COMMUNITY): Payer: Medicare Other

## 2011-12-21 ENCOUNTER — Encounter (HOSPITAL_COMMUNITY): Payer: Self-pay | Admitting: *Deleted

## 2011-12-21 ENCOUNTER — Emergency Department (HOSPITAL_COMMUNITY)
Admission: EM | Admit: 2011-12-21 | Discharge: 2011-12-21 | Disposition: A | Discharge status: Home / Self Care | Payer: Medicare Other | Attending: Emergency Medicine | Admitting: Emergency Medicine

## 2011-12-21 DIAGNOSIS — I10 Essential (primary) hypertension: Secondary | ICD-10-CM | POA: Insufficient documentation

## 2011-12-21 DIAGNOSIS — E876 Hypokalemia: Secondary | ICD-10-CM

## 2011-12-21 DIAGNOSIS — K439 Ventral hernia without obstruction or gangrene: Secondary | ICD-10-CM | POA: Diagnosis not present

## 2011-12-21 DIAGNOSIS — Z9889 Other specified postprocedural states: Secondary | ICD-10-CM | POA: Insufficient documentation

## 2011-12-21 DIAGNOSIS — R109 Unspecified abdominal pain: Secondary | ICD-10-CM | POA: Diagnosis not present

## 2011-12-21 DIAGNOSIS — Z79899 Other long term (current) drug therapy: Secondary | ICD-10-CM | POA: Diagnosis not present

## 2011-12-21 LAB — CBC WITH DIFFERENTIAL/PLATELET
Eosinophils Relative: 3 % (ref 0–5)
HCT: 36.2 % (ref 36.0–46.0)
Hemoglobin: 12.6 g/dL (ref 12.0–15.0)
Lymphocytes Relative: 45 % (ref 12–46)
Lymphs Abs: 3.9 10*3/uL (ref 0.7–4.0)
MCH: 33 pg (ref 26.0–34.0)
MCV: 94.8 fL (ref 78.0–100.0)
Monocytes Absolute: 0.5 10*3/uL (ref 0.1–1.0)
Monocytes Relative: 6 % (ref 3–12)
Platelets: 328 10*3/uL (ref 150–400)
RBC: 3.82 MIL/uL — ABNORMAL LOW (ref 3.87–5.11)
WBC: 8.8 10*3/uL (ref 4.0–10.5)

## 2011-12-21 LAB — HEPATIC FUNCTION PANEL
AST: 13 U/L (ref 0–37)
Albumin: 3.7 g/dL (ref 3.5–5.2)
Alkaline Phosphatase: 68 U/L (ref 39–117)
Total Protein: 6.7 g/dL (ref 6.0–8.3)

## 2011-12-21 LAB — URINALYSIS, ROUTINE W REFLEX MICROSCOPIC
Bilirubin Urine: NEGATIVE
Glucose, UA: NEGATIVE mg/dL
Nitrite: NEGATIVE
Specific Gravity, Urine: 1.028 (ref 1.005–1.030)
pH: 6 (ref 5.0–8.0)

## 2011-12-21 LAB — URINE MICROSCOPIC-ADD ON

## 2011-12-21 LAB — LIPASE, BLOOD: Lipase: 29 U/L (ref 11–59)

## 2011-12-21 MED ORDER — POTASSIUM CHLORIDE CRYS ER 20 MEQ PO TBCR
40.0000 meq | EXTENDED_RELEASE_TABLET | Freq: Once | ORAL | Status: AC
  Administered 2011-12-21: 40 meq via ORAL
  Filled 2011-12-21: qty 1

## 2011-12-21 MED ORDER — SODIUM CHLORIDE 0.9 % IV BOLUS (SEPSIS)
1000.0000 mL | Freq: Once | INTRAVENOUS | Status: AC
  Administered 2011-12-21: 1000 mL via INTRAVENOUS

## 2011-12-21 MED ORDER — HYDROCODONE-ACETAMINOPHEN 5-325 MG PO TABS: ORAL_TABLET | ORAL | Status: DC

## 2011-12-21 MED ORDER — HYDROCODONE-ACETAMINOPHEN 5-325 MG PO TABS
2.0000 | ORAL_TABLET | Freq: Once | ORAL | Status: AC
  Administered 2011-12-21: 2 via ORAL
  Filled 2011-12-21: qty 2

## 2011-12-21 MED ORDER — MORPHINE SULFATE 4 MG/ML IJ SOLN
4.0000 mg | Freq: Once | INTRAMUSCULAR | Status: AC
  Administered 2011-12-21: 4 mg via INTRAVENOUS
  Filled 2011-12-21: qty 1

## 2011-12-21 MED ORDER — IOHEXOL 300 MG/ML  SOLN
100.0000 mL | Freq: Once | INTRAMUSCULAR | Status: AC | PRN
  Administered 2011-12-21: 100 mL via INTRAVENOUS

## 2011-12-21 MED ORDER — POTASSIUM CHLORIDE 10 MEQ/100ML IV SOLN
10.0000 meq | INTRAVENOUS | Status: AC
  Administered 2011-12-21 (×2): 10 meq via INTRAVENOUS
  Filled 2011-12-21 (×2): qty 100

## 2011-12-21 NOTE — ED Notes (Signed)
Pt given oral CT contrast at this time by CT tech.

## 2011-12-21 NOTE — ED Provider Notes (Signed)
History   This chart was scribed for Cynthia Numbers, MD by Melba Coon. The patient was seen in room TR09C/TR09C and the patient's care was started at 4:10PM.    CSN: [id number]  Arrival date & time 12/21/11  1359   None     Chief Complaint  Patient presents with  . Hernia    (Consider location/radiation/quality/duration/timing/severity/associated sxs/prior treatment) The history is provided by the patient. No language interpreter was used.   Cynthia Hess is a 56 y.o. female who presents to the Emergency Department complaining of constant, moderate to severe upper abdominal pain pertaining to a hernia with an onset 2 weeks ago. Cynthia Hess was supposed to have an evaluation of her hernia by [facility] which is now closed. Hx of abd hernia surgeries 3x; she states that the surgeons "just push it back in". She states that she can't push it back in herself. Reports severity of pain 9/10. Last normal BM was this morning. Advil slightly alleviated the pain. Reports cough. Denies fever, n/v/d, urinary or bowel dysfunction, and dysuria. Hx of kidney stones. No Hx of diabetes. No other pertinent medical symptoms.  No PCP; waiting on [facility]   Past Medical History  Diagnosis Date  . Arthritis   . Hypertension     Past Surgical History  Procedure Date  . Hernia repair   . Abdominal hysterectomy   . Small intestine surgery   . Bladder surgery     History reviewed. No pertinent family history.  History  Substance Use Topics  . Smoking status: Current Every Day Smoker -- 0.4 packs/day    Types: Cigarettes  . Smokeless tobacco: Never Used   Comment: cutting back  . Alcohol Use: Not on file    OB History    Grav Para Term Preterm Abortions TAB SAB Ect Mult Living                  Review of Systems 10 Systems reviewed and all are negative for acute change except as noted in the HPI.   Allergies  Latex  Home Medications   Current Outpatient Rx  Name Route  Sig Dispense Refill  . IBUPROFEN 200 MG PO TABS Oral Take 400 mg by mouth every 6 (six) hours as needed. For pain    . IBUPROFEN 800 MG PO TABS Oral Take 800 mg by mouth every 6 (six) hours as needed. For pain    . LISINOPRIL 40 MG PO TABS Oral Take 40 mg by mouth daily.    . TRAMADOL HCL 50 MG PO TABS Oral Take 50 mg by mouth every 6 (six) hours as needed. For pain      BP 177/89  Pulse 64  Temp 98.8 F (37.1 C) (Oral)  Resp 16  SpO2 99%  Physical Exam GEN: Well-developed, well-nourished female in no distress HEENT: Atraumatic, normocephalic. Oropharynx clear without erythema EYES: PERRLA BL, no scleral icterus. NECK: Trachea midline, no meningismus CV: regular rate and rhythm. No murmurs, rubs, or gallops PULM: No respiratory distress.  No crackles, wheezes, or rales. GI: palpable hernia noted. Soft but slightly TTP. No guarding, rebound, or tenderness. + bowel sounds  GU: deferred Neuro: cranial nerves grossly 2-12 intact, no abnormalities of strength or sensation, A and O x 3 MSK: Patient moves all 4 extremities symmetrically, no deformity, edema, or injury noted Skin: No rashes petechiae, purpura, or jaundice Psych: no abnormality of mood  ED Course  Procedures (including critical care time)  Indication: hypokalemia  Please note this EKG was reviewed extemporaneously by myself.   Date: 12/21/2011  Rate: 57  Rhythm: sinus bradycardia  QRS Axis: normal  Intervals: normal  ST/T Wave abnormalities: normal  Conduction Disutrbances:none  Narrative Interpretation:   Old EKG Reviewed: unchanged      DIAGNOSTIC STUDIES: Oxygen Saturation is 97% on room air, normal by my interpretation.    COORDINATION OF CARE:  4:17PM - morphine, IV fluids, blood w/u, UA, and abd CT will be ordered for the pt. 5:30PM - lab results reviewed; abd pelvic CT w/ contrast will be ordered for the pt along with potassium chloride   Labs Reviewed  CBC WITH DIFFERENTIAL - Abnormal; Notable  for the following:    RBC 3.82 (*)     All other components within normal limits  URINALYSIS, ROUTINE W REFLEX MICROSCOPIC - Abnormal; Notable for the following:    Hgb urine dipstick MODERATE (*)     All other components within normal limits  URINE MICROSCOPIC-ADD ON - Abnormal; Notable for the following:    Squamous Epithelial / LPF FEW (*)     All other components within normal limits  LIPASE, BLOOD  LACTIC ACID, PLASMA  HEPATIC FUNCTION PANEL   Ct Abdomen Pelvis W Contrast  12/21/2011  *RADIOLOGY REPORT*  Clinical Data: 56 year old female with pain times 2 weeks. Previous abdominal surgeries.  Abdominal hernia.  CT ABDOMEN AND PELVIS WITH CONTRAST  Technique:  Multidetector CT imaging of the abdomen and pelvis was performed following the standard protocol during bolus administration of intravenous contrast.  Contrast: OMNIPAQUE IOHEXOL 300 MG/ML  SOLN  Comparison: 05/11/2007.  Findings: Stable mild cardiomegaly.  No pericardial or pleural effusion.  Minor atelectasis at both lung bases.  Chronic lower lumbar facet degeneration.  Chronic degenerative changes at the left hip joint. No acute osseous abnormality identified.  No pelvic free fluid.  Bladder decompressed.  Oral contrast has reached the sigmoid colon which is redundant but otherwise within normal limits.  Uterus and adnexa appear to be surgically absent as before.  Multiple pelvic phleboliths.  A small midline upper abdominal ventral hernia containing only fat (series 2 image 35). Nearby transverse colon appears within normal limits.  More proximal colon and appendix are normal.  No dilated small bowel. Stomach and duodenum within normal limits.  Liver, gallbladder, spleen, pancreas, adrenal glands, portal venous system, and kidneys are within normal limits.  Major arterial structures are patent with mild atherosclerosis.  No abdominal free fluid or free air.  No lymphadenopathy.  IMPRESSION: 1.  Small supraumbilical fat-containing  ventral hernia, see series 2 image 35.  No complicating features. 2.  Otherwise no acute findings in the abdomen or pelvis.   Original Report Authenticated By: Ulla Potash III, M.D.      1. Abdominal pain   2. Ventral hernia without obstruction or gangrene   3. Hypokalemia       MDM  Patient was evaluated by myself. Based on evaluation patient did not appear to have a bowel obstruction as she had had last bowel movement yesterday and denies any nausea or vomiting. She complained of weeks of ongoing crampy abdominal pain. She no longer has a primary care physician as she was previously a patient of [facility]. Patient did not have an acute abdomen on my valuation. Laboratory workup was performed. There is no leukocytosis, lactic acidosis, elevation of liver enzymes her lipase, or signs of infection on urinalysis. Patient did have noted potassium of only 2.7 on i-STAT. Patient  was given 2 runs of potassium while awaiting CT scan in case she had acute surgical process. This is being performed at this time. If this were within normal limits patient did receive an additional 40 mEq of potassium by mouth. She does not have any history of renal insufficiency. EKG was reviewed with no changes to suggest hypokalemia.  CT was unremarkable and patient improved.  She was given 40 more mEq po.  Patient given referral list to PCPs as she does not have one.  Patient given 30 days of vicodin as she states tramadol just isn't working but I cautioned her about the risk of contitpation.  Patient was not referred to surgery as she has already been told that due to her 3 previous surgeries they would not elect to operate unless patient had a surgical emergency.  Patient tolerating po prior to d/c and d/c'ed in good condition.   I personally performed the services described in this documentation, which was scribed in my presence. The recorded information has been reviewed and considered.         Cynthia Numbers,  MD 12/22/11 [phone]

## 2011-12-21 NOTE — ED Notes (Signed)
Pt has hernia in upper abd with pain for past 2 weeks. Pt was to have eval by pmd at [facility] but unable to do so due to them closing. Pt states she has had 3 surgeries on abd so now 'they just push it back in'. Last BM this am and normal. No vomiting. Skin w/d, resp e/u.

## 2011-12-24 LAB — POCT I-STAT, CHEM 8
HCT: 35 % — ABNORMAL LOW (ref 36.0–46.0)
Hemoglobin: 11.9 g/dL — ABNORMAL LOW (ref 12.0–15.0)
Potassium: 2.7 mEq/L — CL (ref 3.5–5.1)
Sodium: 144 mEq/L (ref 135–145)

## 2012-02-18 ENCOUNTER — Encounter: Payer: Self-pay | Admitting: Family Medicine

## 2012-03-03 ENCOUNTER — Encounter: Payer: Self-pay | Admitting: Family Medicine

## 2012-03-03 ENCOUNTER — Ambulatory Visit (INDEPENDENT_AMBULATORY_CARE_PROVIDER_SITE_OTHER): Payer: Medicare Other | Admitting: Family Medicine

## 2012-03-03 VITALS — BP 129/79 | HR 88 | Temp 98.1°F | Ht 62.0 in | Wt 159.0 lb

## 2012-03-03 DIAGNOSIS — I10 Essential (primary) hypertension: Secondary | ICD-10-CM

## 2012-03-03 DIAGNOSIS — M545 Low back pain, unspecified: Secondary | ICD-10-CM | POA: Diagnosis not present

## 2012-03-03 DIAGNOSIS — F17200 Nicotine dependence, unspecified, uncomplicated: Secondary | ICD-10-CM | POA: Diagnosis not present

## 2012-03-03 LAB — BASIC METABOLIC PANEL
Potassium: 4 mEq/L (ref 3.5–5.3)
Sodium: 141 mEq/L (ref 135–145)

## 2012-03-03 MED ORDER — LISINOPRIL 40 MG PO TABS: 40.0000 mg | ORAL_TABLET | Freq: Every day | ORAL | Status: DC

## 2012-03-03 MED ORDER — TRAMADOL HCL 50 MG PO TABS: 50.0000 mg | ORAL_TABLET | Freq: Four times a day (QID) | ORAL | Status: DC | PRN

## 2012-03-03 MED ORDER — IBUPROFEN 800 MG PO TABS: 800.0000 mg | ORAL_TABLET | Freq: Four times a day (QID) | ORAL | Status: DC | PRN

## 2012-03-03 MED ORDER — OMEPRAZOLE 20 MG PO CPDR: 20.0000 mg | DELAYED_RELEASE_CAPSULE | Freq: Every day | ORAL | Status: DC

## 2012-03-03 MED ORDER — AMITRIPTYLINE HCL 50 MG PO TABS: 50.0000 mg | ORAL_TABLET | Freq: Every day | ORAL | Status: DC

## 2012-03-03 NOTE — Patient Instructions (Signed)
It was nice to meet you today.  For your blood pressure, I refilled your lisinopril. We are checking a lab today to see how your kidneys are doing.  For your pain, I am starting a medicine for you to take a night time- it should help with both pain and sleep. Do not take the ibuprofen more than 3 times per day. Do not take the Tramadol more frequently than every 6 hours. I am starting a medicine to make sure you do not get a stomach ulcer.  Come back to see me in 1 month and we will make sure your blood pressure is doing ok on just the lisinopril.  If it isn't, we will restart the clonidine.  Make sure you follow up with the pain clinic for your back pain.

## 2012-03-03 NOTE — Assessment & Plan Note (Signed)
WNL today. Ran out of clonidine a few weeks ago, so will only refill lisinopril at this time.  Check BMET given lisinopril + over use of ibuprofen. F/u 1 month

## 2012-03-03 NOTE — Assessment & Plan Note (Signed)
Not interested in quitting. Smokes 1/2 ppd

## 2012-03-03 NOTE — Progress Notes (Signed)
S: Pt comes in today for new patient appointment.  Other issues patient would like to discuss: *HTN- ran out of meds this morning- only took lisinopril 40mg , ran out of clonidine last week; No CP or SOB   *Back Pain- hurts all the time, especially with bending; also with hip pain bilaterally, pain at night time keeps her from sleeping; currently taking tramadol and ibuprofen; feels like meds aren't helping- taking tramadol 50mg  every 2 hours and takes ibuprofen 800mg  every 3 hours; also sometimes takes Advil; hasn't returned to the pain clinic (Dr. Wynn Banker); pain is a dull, pounding pain in her low back   Shoulder Pain Med Refill HLD   ROS: Per HPI  History  Smoking status  . Current Every Day Smoker -- 0.4 packs/day  . Types: Cigarettes  Smokeless tobacco  . Never Used    Comment: cutting back    O:  Filed Vitals:   03/03/12 1053  BP: 129/79  Pulse: 88  Temp: 98.1 F (36.7 C)    Gen: NAD CV: RRR, no murmur Pulm: CTA bilat, no wheezes or crackles Ext: Warm, no edema Back: full ROM but difficulty getting up from forward bend, + TTP lumbar area both spinal and paraspinal    A/P: 56 y.o. female p/w NP visit, chronic LBP, HTN -See problem list -f/u in 1 month

## 2012-03-03 NOTE — Assessment & Plan Note (Signed)
Chronic issue. Was seen at pain clinic 1 time previously; currently is taking ibuprofen and tramadol too frequently due to inadequate pain relief.  Patient made aware that I will not Rx chronic narcotics- she will need to return to pain clinic (however, based on their notes they will not treat with narcotics due to + UDS for cocaine).  Will add amitriptyline qhs to help with sleep and pain. Consider gabapentin in the future if pt will not f/u with pain clinic. Check BMET today for Cr.

## 2012-04-05 ENCOUNTER — Other Ambulatory Visit: Payer: Self-pay | Admitting: Family Medicine

## 2012-04-09 ENCOUNTER — Encounter: Payer: Medicare Other | Admitting: Family Medicine

## 2012-04-20 ENCOUNTER — Encounter: Payer: Medicare Other | Admitting: Family Medicine

## 2012-05-05 ENCOUNTER — Other Ambulatory Visit: Payer: Self-pay | Admitting: Family Medicine

## 2012-06-07 ENCOUNTER — Other Ambulatory Visit: Payer: Self-pay | Admitting: Family Medicine

## 2012-06-10 ENCOUNTER — Other Ambulatory Visit: Payer: Self-pay | Admitting: Family Medicine

## 2012-06-10 NOTE — Telephone Encounter (Signed)
Pt needs appt with PCP for further refills- needs to be evaluated with her chronic back pain, last seen 02/2012.

## 2012-07-12 ENCOUNTER — Other Ambulatory Visit: Payer: Self-pay | Admitting: Family Medicine

## 2012-07-14 ENCOUNTER — Other Ambulatory Visit: Payer: Self-pay | Admitting: Family Medicine

## 2012-08-13 ENCOUNTER — Other Ambulatory Visit: Payer: Self-pay | Admitting: Family Medicine

## 2012-08-17 ENCOUNTER — Other Ambulatory Visit: Payer: Self-pay | Admitting: Family Medicine

## 2012-08-19 ENCOUNTER — Other Ambulatory Visit: Payer: Self-pay | Admitting: Family Medicine

## 2012-08-30 ENCOUNTER — Ambulatory Visit (INDEPENDENT_AMBULATORY_CARE_PROVIDER_SITE_OTHER): Payer: Medicare Other | Admitting: Family Medicine

## 2012-08-30 ENCOUNTER — Encounter: Payer: Self-pay | Admitting: Family Medicine

## 2012-08-30 VITALS — BP 172/83 | HR 67 | Temp 98.2°F | Ht 62.0 in | Wt 161.0 lb

## 2012-08-30 DIAGNOSIS — M545 Low back pain, unspecified: Secondary | ICD-10-CM | POA: Diagnosis not present

## 2012-08-30 DIAGNOSIS — I10 Essential (primary) hypertension: Secondary | ICD-10-CM | POA: Diagnosis not present

## 2012-08-30 NOTE — Assessment & Plan Note (Signed)
Elevated today, pt in pain.  Would prefer to do amb BP monitoring prior to restarting clonidine (preivously on this only qhs).  F/u after BP monitoring done.

## 2012-08-30 NOTE — Assessment & Plan Note (Addendum)
Chronic issue, will refer back to pain clinic since pain nature is same but intensity is worse despite continued inappropriate over use of ibupofen and tramadol.

## 2012-08-30 NOTE — Patient Instructions (Signed)
It was good to see you today.  We will send you back to the pain clinic since the tramadol isn't helping anymore.  Your blood pressure is high-- make an appointment to do AMBULATORY BLOOD PRESSURE MONITORING with the PHARMACY CLINIC in the next few weeks.  Come back after you have your blood pressures checked.

## 2012-08-30 NOTE — Progress Notes (Signed)
S: Pt comes in today for SDA for back pain.  Pt with known chronic back pain, previously seen by pain clinic.  Was taking iburpofen + tramadol at her NP appt 02/2012 but was taking more than Rx'ed.  Doesn't feel like ibuprofen and tramadol are helping anymore-- still has some at home.  No numbness/tingling in legs.  No bowel/bladder issues other than occasional urinary stress incontinence.  Back pain is still in her low back, achy/pounding.    Pain is worse than 10/10 right now.   Also noted to have HTN with elevated BP-- supposed to be taking lisinopril, last took it this morning, usually takes it every day.  No dizziness or CP, no SOB, no LEE- occasional foot swelling.    ROS: Per HPI  History  Smoking status  . Current Every Day Smoker -- 0.50 packs/day  . Types: Cigarettes  Smokeless tobacco  . Never Used    Comment: cutting back    O:  Filed Vitals:   08/30/12 1058  BP: 172/83  Pulse: 67  Temp: 98.2 F (36.8 C)    Gen: NAD CV: RRR, no murmur Pulm: CTA bilat, no wheezes or crackles Ext: Warm, no edema   A/P: 57 y.o. female p/w chronic pain, HTN -See problem list -f/u in 1 month

## 2012-09-06 ENCOUNTER — Encounter: Payer: Self-pay | Admitting: Pharmacist

## 2012-09-06 ENCOUNTER — Ambulatory Visit (INDEPENDENT_AMBULATORY_CARE_PROVIDER_SITE_OTHER): Payer: Medicare Other | Admitting: Pharmacist

## 2012-09-06 VITALS — BP 164/98 | Ht 62.0 in | Wt 161.0 lb

## 2012-09-06 DIAGNOSIS — M545 Low back pain, unspecified: Secondary | ICD-10-CM

## 2012-09-06 DIAGNOSIS — F17200 Nicotine dependence, unspecified, uncomplicated: Secondary | ICD-10-CM

## 2012-09-06 DIAGNOSIS — I10 Essential (primary) hypertension: Secondary | ICD-10-CM

## 2012-09-06 MED ORDER — OMEPRAZOLE 40 MG PO CPDR: 40.0000 mg | DELAYED_RELEASE_CAPSULE | Freq: Every day | ORAL | Status: DC

## 2012-09-06 MED ORDER — HYDROCHLOROTHIAZIDE 25 MG PO TABS: 25.0000 mg | ORAL_TABLET | Freq: Every day | ORAL | Status: DC

## 2012-09-06 MED ORDER — MELOXICAM 15 MG PO TABS: 15.0000 mg | ORAL_TABLET | Freq: Every day | ORAL | Status: DC

## 2012-09-06 MED ORDER — TRAMADOL HCL 50 MG PO TABS: 50.0000 mg | ORAL_TABLET | Freq: Four times a day (QID) | ORAL | Status: DC | PRN

## 2012-09-06 NOTE — Assessment & Plan Note (Signed)
   Chronic back pain, uncontrolled. Rated 10/10 in office today. Uses excessive NSAIDs without significant relief.  -change to meloxicam 15mg  daily and stop ibuprofen and Advil PM- patient voiced understanding -add Tylenol ER 650mg  TID -increase tramadol to 50-100mg  q6hr prn- explained to try and take one of these doses prior to sleep to help with pain at nighttime. -increase Prilosec to 40mg  daily d/t likely chronic NSAID use -discussed with Dr. Fara Boros who agrees with this plan

## 2012-09-06 NOTE — Patient Instructions (Addendum)
Add HCTZ 25mg  daily Change to tramadol 1-2 tablets q6hr PRN- try and take one of these doses right before going to bed to help with pain during sleep Change to meloxicam 15mg  daily (stop ibuprofen and Advil) Increase Prilosec to 40mg  daily Start taking Tylenol ER 650mg  TID

## 2012-09-06 NOTE — Assessment & Plan Note (Addendum)
Has had HTN diagnosis for ~4 years- reports adherence with lisinopril, but remains uncontrolled likely to pain, NSAID use and tobacco use. - add HCTZ 25mg  tablet daily -f/u visit with Dr. Fara Boros in 1-2 weeks to re-evaluate. Will reconsider ambulatory blood pressure monitor at that point. -recommend BMET at next appointment to assess kidney function with chronic NSAID use

## 2012-09-06 NOTE — Progress Notes (Signed)
  Subjective:    Patient ID: Cynthia Hess, female    DOB: 12-11-1955, 57 y.o.   MRN: [id number]  HPI Arrives in good spirits, but can tell she is in pain. Reports 10/10 pain which is chronic for her. Back pain has been going on for ~4 years. Reports a cyst on her back. Reports sometimes she uses a cane to walk because of her pain in her back and hips. Discussed her NSAID use- uses 7-8 tablets of ibuprofen 800mg  tablets plus 2 tablets of Advil PM "for sleep" as well as 2 more tablets if she wakes up during the night.  Age when started using tobacco on a daily basis in her 66s. Number of Cigarettes per day 3-4, max is 1 PPD; also uses an E-cigarette every 1-2 hours Brand smoked [place] full flavor. Estimated Nicotine Content per Cigarette (mg) 1mg .  Estimated Nicotine intake per day ~4mg  from cigarettes plus unknown content from E-cigarette.   Longest time ever been tobacco free while hospitalized many years ago. What Medications (NRT, bupropion, varenicline) used in past includes N/A.  Rates READINESS of quitting tobacco on 1-10 scale of 7. Rates CONFIDENCE of quitting tobacco on 1-10 scale of 10. Triggers to use tobacco include: anxiety   HTN diagnoses in 2011- was previously on clonidine qhs only. Now on lisinopril 40mg  daily- reports compliance. Manual BP reading was consistent with machine reading- both SBP in the 160s. Difficult to assess her BP as she was in 10/10 pain today during our visit. Review of Systems     Objective:   Physical Exam      Assessment & Plan:  Has had HTN diagnosis for ~4 years- reports adherence with lisinopril, but remains uncontrolled likely to pain, NSAID use and tobacco use. - add HCTZ 25mg  tablet daily -f/u visit with Dr. Fara Boros in 1-2 weeks to re-evaluate. Will reconsider ambulatory blood pressure monitor at that point.  Chronic back pain, uncontrolled. Rated 10/10 in office today. Uses excessive NSAIDs without significant relief.  -change to meloxicam  15mg  daily and stop ibuprofen and Advil PM- patient voiced understanding -add Tylenol ER 650mg  TID -increase tramadol to 50-100mg  q6hr prn- explained to try and take one of these doses prior to sleep to help with pain at nighttime. -increase Prilosec to 40mg  daily d/t likely chronic NSAID use  moderate Nicotine Dependence of ~30 years duration in a patient who is good candidate for success b/c of motivation at this time.    Written information provided.  F/U Rx Clinic Visit in July after follow up with Dr. Fara Boros. Total time in face-to-face counseling 30 minutes.  Patient seen with Brigid Re, PharmD and Olivia Mackie PhD psychology student.   Add HCTZ 25mg  daily Change to tramadol 1-2 tablets q6hr PRN- try and take one of these doses right before going to bed to help with pain during sleep Change to meloxicam 15mg  daily (stop ibuprofen and Advil) Increase Prilosec to 40mg  daily Start taking Tylenol ER 650mg  TID

## 2012-09-06 NOTE — Progress Notes (Signed)
Patient ID: Cynthia Hess, female   DOB: [birth year], 56 y.o.   MRN: [id number] Reviewed: Agree with Dr. Macky Lower documentation and management.

## 2012-09-06 NOTE — Assessment & Plan Note (Signed)
  moderate Nicotine Dependence of ~30 years duration in a patient who is good candidate for success b/c of motivation at this time.    Written information provided.  F/U Rx Clinic Visit in July after follow up with Dr. Fara Boros. Total time in face-to-face counseling 30 minutes.  Patient seen with Brigid Re, PharmD and Olivia Mackie PhD psychology student.

## 2012-09-14 ENCOUNTER — Telehealth: Payer: Self-pay | Admitting: Family Medicine

## 2012-09-14 DIAGNOSIS — I10 Essential (primary) hypertension: Secondary | ICD-10-CM

## 2012-09-14 NOTE — Telephone Encounter (Signed)
Patient is calling about [facility] switching to [facility] and the Lisinopril has to be resent to [facility] on [address] before she can get it refilled.  Also, the Omeprazole that Paulino Rily gave her make her feel sick and make her urine turn brown.

## 2012-09-14 NOTE — Telephone Encounter (Signed)
Will fwd to Md.  Osamah Schmader L, CMA  

## 2012-09-15 MED ORDER — LISINOPRIL 40 MG PO TABS: 40.0000 mg | ORAL_TABLET | Freq: Every day | ORAL | Status: DC

## 2012-09-15 NOTE — Telephone Encounter (Signed)
Lisinopril sent to [facility]. Thanks

## 2012-09-16 ENCOUNTER — Ambulatory Visit: Payer: Medicare Other | Admitting: Physical Medicine & Rehabilitation

## 2012-09-21 ENCOUNTER — Ambulatory Visit: Payer: Medicare Other | Admitting: Physical Medicine & Rehabilitation

## 2012-09-21 ENCOUNTER — Encounter: Payer: Medicare Other | Attending: Physical Medicine & Rehabilitation

## 2012-10-07 ENCOUNTER — Telehealth: Payer: Self-pay | Admitting: Family Medicine

## 2012-10-07 NOTE — Telephone Encounter (Signed)
Patient is calling to let her doctor know that [facility] will be calling because she has ordered a back brace.

## 2012-10-21 ENCOUNTER — Telehealth: Payer: Self-pay | Admitting: Family Medicine

## 2012-10-21 DIAGNOSIS — M545 Low back pain, unspecified: Secondary | ICD-10-CM

## 2012-10-21 DIAGNOSIS — M5126 Other intervertebral disc displacement, lumbar region: Secondary | ICD-10-CM

## 2012-10-21 DIAGNOSIS — M479 Spondylosis, unspecified: Secondary | ICD-10-CM

## 2012-10-21 NOTE — Telephone Encounter (Signed)
Back pain appears to be chronic in nature, on chart review. Order placed for orthopedic referral. I have not met Cynthia Hess, yet, but judging from Dr. Jamie Kato and other providers' notes, referral seems appropriate. Thanks. --CMS

## 2012-10-21 NOTE — Telephone Encounter (Signed)
Patient is calling requesting a referral to [facility] for her back pain.

## 2012-10-21 NOTE — Telephone Encounter (Signed)
Will fwd to MD.  Blade Scheff L, CMA  

## 2012-10-28 ENCOUNTER — Encounter: Payer: Medicare Other | Admitting: Family Medicine

## 2012-10-28 NOTE — Progress Notes (Signed)
  This encounter was created in error - please disregard. Pt no-showed this appointment.

## 2012-11-01 DIAGNOSIS — M47817 Spondylosis without myelopathy or radiculopathy, lumbosacral region: Secondary | ICD-10-CM | POA: Diagnosis not present

## 2012-11-01 DIAGNOSIS — M545 Low back pain, unspecified: Secondary | ICD-10-CM | POA: Diagnosis not present

## 2012-11-02 ENCOUNTER — Encounter: Payer: Medicare Other | Admitting: Family Medicine

## 2012-11-02 DIAGNOSIS — M545 Low back pain, unspecified: Secondary | ICD-10-CM | POA: Diagnosis not present

## 2012-11-12 ENCOUNTER — Encounter: Payer: Medicare Other | Admitting: Family Medicine

## 2012-12-04 ENCOUNTER — Other Ambulatory Visit: Payer: Self-pay | Admitting: Family Medicine

## 2012-12-06 NOTE — Telephone Encounter (Signed)
Received Rx request for tramadol, last filled in June. Per chart review, pt has chronic pain and has requested referral to orthopedics (which has been placed, based off of chart review; of note pt has no-showed more than one visit with me in the past 2-3 months). Refused Rx request until pt can be seen in the office. Will discuss pain management and any referrals at that time.

## 2012-12-29 ENCOUNTER — Ambulatory Visit: Payer: Medicare Other | Admitting: Family Medicine

## 2013-03-25 ENCOUNTER — Other Ambulatory Visit: Payer: Self-pay | Admitting: Family Medicine

## 2013-03-30 ENCOUNTER — Ambulatory Visit (INDEPENDENT_AMBULATORY_CARE_PROVIDER_SITE_OTHER): Payer: Medicare Other | Admitting: Family Medicine

## 2013-03-30 ENCOUNTER — Encounter: Payer: Self-pay | Admitting: Family Medicine

## 2013-03-30 VITALS — BP 150/91 | HR 87 | Temp 99.3°F | Ht 62.0 in | Wt 165.0 lb

## 2013-03-30 DIAGNOSIS — I10 Essential (primary) hypertension: Secondary | ICD-10-CM | POA: Diagnosis not present

## 2013-03-30 DIAGNOSIS — K429 Umbilical hernia without obstruction or gangrene: Secondary | ICD-10-CM

## 2013-03-30 MED ORDER — TRAMADOL HCL 50 MG PO TABS: 50.0000 mg | ORAL_TABLET | Freq: Four times a day (QID) | ORAL | Status: DC | PRN

## 2013-03-30 MED ORDER — HYDROCHLOROTHIAZIDE 25 MG PO TABS: 25.0000 mg | ORAL_TABLET | Freq: Every day | ORAL | Status: DC

## 2013-03-30 NOTE — Assessment & Plan Note (Signed)
A: Abdominal mass noted 03/30/13, examined with and precepted with Dr. Leveda Anna. Present for at least two months, painful for 2 weeks, but no systemic symptoms and mass at least partially reduces spontaneously with sitting / lying down. Pt has a history of other hernias near the current mass, but examiners today could not determine definite origin of likely new hernia.  P: Referral placed to general surgery; considered CT scan, but not definitely necessary with hernia clinically very likely (will defer to surgeons for any imaging). Rx for tramadol for pain, in the meantime. Reviewed red flags that would indicate immediate need for evaluation / possible surgical management emergently (including but not limited to worse pain, enlarging or hardening of mass with loss of reducibility, systemic symptoms such as fever, nausea, vomiting, or other signs/symptoms of obstruction such as lack of flatulence or BM for 24+ hours). Pt voiced understanding.

## 2013-03-30 NOTE — Patient Instructions (Signed)
Thank you for coming in, today!  You most likely have another hernia around your belly-button. You can read about hernias, below. We will refer you to the surgery clinic. They will evaluate you and decide if you need more surgery. It may take several days or longer to get an appointment. In the meantime, you can take tramadol as needed for pain.  If you have any of the following symptoms, call the clinic or go to the emergency room: Worse pain that does not improve or continues to get worse Nausea, vomiting, or a lack of gas or a bowel movement for 24-48 hours or more If the knot gets larger or suddenly more painful and doesn't "go in" when you sit or lie down.  If you don't hear from the surgeon's office with an appointment in the next 1-2 weeks, call us back. Come back to see me sometime in the next few months for a regular yearly check-up. Please feel free to call with any questions or concerns at any time, at [phone]. --Dr. Casper Harrison  Hernia A hernia happens when an organ inside your body pushes out through a weak spot in your belly (abdominal) wall. Most hernias get worse over time. They can often be pushed back into place (reduced). Surgery may be needed to repair hernias that cannot be pushed into place. HOME CARE  Keep doing normal activities.  Avoid lifting more than 10 pounds (4.5 kilograms).  Cough gently and avoid straining. Over time, these things will:  Increase your hernia size.  Irritate your hernia.  Break down hernia repairs.  Stop smoking.  Do not wear anything tight over your hernia. Do not keep the hernia in with an outside bandage.  Eat food that is high in fiber (fruit, vegetables, whole grains).  Drink enough fluids to keep your pee (urine) clear or pale yellow.  Take medicines to make your poop soft (stool softeners) if you cannot poop (constipated). GET HELP RIGHT AWAY IF:   You have a fever.  You have belly pain that gets worse.  You feel sick  to your stomach (nauseous) and throw up (vomit).  Your skin starts to bulge out.  Your hernia turns a different color, feels hard, or is tender.  You have increased pain or puffiness (swelling) around the hernia.  You poop more or less often.  Your poop does not look the way normally does.  You have watery poop (diarrhea).  You cannot push the hernia back in place by applying gentle pressure while lying down. MAKE SURE YOU:   Understand these instructions.  Will watch your condition.  Will get help right away if you are not doing well or get worse. Document Released: 09/04/2009 Document Revised: 06/09/2011 Document Reviewed: 09/04/2009 [facility] Patient Information 2014 [place], [place].

## 2013-03-30 NOTE — Progress Notes (Signed)
   Subjective:    Patient ID: Cynthia Hess, female    DOB: 10/18/1955, 57 y.o.   MRN: [id number]  HPI: Pt presents to clinic for SDA for a "knot" in her stomach. Pt states the place has been present for about two months, a quarter-sized "knot" in her mid-lower abdomen. The area has started hurting for the past two weeks but has not been getting worse and has not been enlarging. The pain is described as an ache; 7/10 just sitting still, worse with touching but not worse with change in position, coughing, movement, etc. Pain interferes with her sleep. She has taken meloxicam for pain. The area does not feel like she can push it in and she denies any rash or other skin changes over the area. She does have a history of hernia farther superior on her abdomen that has been operated on x3 (last in 2007).  Of note, pt has chronic HTN and is out of HCTZ and requests a refill.  Review of Systems: Pt denies N/V, no other pain other than chronic back pain, but does endorse sore throat, mild subjective fever, and cough / coryza type symptoms for a few days (grandchildren with symptoms similar to her's for about 1 week). Pt denies new constipation or diarrhea (takes stool softeners every other day due to pain medicine "binding her up").     Objective:   Physical Exam BP 150/91  Pulse 87  Temp(Src) 99.3 F (37.4 C) (Oral)  Ht 5\' 2"  (1.575 m)  Wt 165 lb (74.844 kg)  BMI 30.17 kg/m2 Gen: well-appearing adult female in no acute distress Abd: soft, BS+; hernia surgery scar(s) present superior to and left-lateral to umbilicus  No definite residual hernia palpated at scars themselves  Firm, approximately 2-cm mass palpated approximately 1 inch lateral to umbilicus on the right  Mass is very tender and is not easily palpated with pt sitting / lying down; felt initially with pt standing  Mass reduces at least partially spontaneously while being palpated while pt sits / lies down  No overlying skin  changes Cardio: RRR, no murmur appreciated Pulm: CTAB, no wheezes, normal WOB Skin: warm, dry, intact, no rashes noted; abdominal scars as above Ext: warm, well-perfused, no LE edema     Assessment & Plan:  Precepted and examined with Dr. Leveda Anna. See problem list notes.

## 2013-03-30 NOTE — Assessment & Plan Note (Signed)
Reports compliance with lisinopril, but is out of HCTZ; saw Dr. Raymondo Band in June but never followed up after that. BP 150/91 today. Refilled HCTZ. Plan to f/u for formal yearly physical in the next several weeks.

## 2013-04-08 ENCOUNTER — Other Ambulatory Visit: Payer: Self-pay | Admitting: Family Medicine

## 2013-04-15 ENCOUNTER — Ambulatory Visit (INDEPENDENT_AMBULATORY_CARE_PROVIDER_SITE_OTHER): Payer: Medicare Other | Admitting: Surgery

## 2013-04-29 ENCOUNTER — Ambulatory Visit (INDEPENDENT_AMBULATORY_CARE_PROVIDER_SITE_OTHER): Payer: Medicare Other | Admitting: Surgery

## 2013-05-05 ENCOUNTER — Other Ambulatory Visit: Payer: Self-pay | Admitting: Family Medicine

## 2013-05-05 DIAGNOSIS — Z1231 Encounter for screening mammogram for malignant neoplasm of breast: Secondary | ICD-10-CM

## 2013-05-09 ENCOUNTER — Ambulatory Visit (HOSPITAL_COMMUNITY): Payer: Medicare Other | Attending: Family Medicine

## 2013-05-17 ENCOUNTER — Ambulatory Visit (INDEPENDENT_AMBULATORY_CARE_PROVIDER_SITE_OTHER): Payer: Medicare Other | Admitting: Surgery

## 2013-06-01 ENCOUNTER — Ambulatory Visit (INDEPENDENT_AMBULATORY_CARE_PROVIDER_SITE_OTHER): Payer: Medicare Other | Admitting: Surgery

## 2013-06-01 ENCOUNTER — Encounter (INDEPENDENT_AMBULATORY_CARE_PROVIDER_SITE_OTHER): Payer: Self-pay | Admitting: Surgery

## 2013-06-01 VITALS — BP 116/80 | HR 75 | Temp 98.2°F | Ht 62.0 in | Wt 167.0 lb

## 2013-06-01 DIAGNOSIS — K439 Ventral hernia without obstruction or gangrene: Secondary | ICD-10-CM | POA: Diagnosis not present

## 2013-06-01 DIAGNOSIS — K5900 Constipation, unspecified: Secondary | ICD-10-CM | POA: Diagnosis not present

## 2013-06-01 DIAGNOSIS — K429 Umbilical hernia without obstruction or gangrene: Secondary | ICD-10-CM

## 2013-06-01 DIAGNOSIS — Z720 Tobacco use: Secondary | ICD-10-CM

## 2013-06-01 DIAGNOSIS — F17200 Nicotine dependence, unspecified, uncomplicated: Secondary | ICD-10-CM

## 2013-06-01 DIAGNOSIS — E669 Obesity, unspecified: Secondary | ICD-10-CM | POA: Diagnosis not present

## 2013-06-01 NOTE — Patient Instructions (Signed)
Please consider the recommendations that we have given you today:  Consider surgery to repair the recurrent hernias in her abdomen with mesh.  A planned laparoscopic/minimally invasive approach.  At least an overnight stay in the hospital.  See the Handout(s) we have given you.  Please call our office at [phone] if you wish to schedule surgery or if you have further questions / concerns.   Hernia A hernia occurs when an internal organ pushes out through a weak spot in the abdominal wall. Hernias most commonly occur in the groin and around the navel. Hernias often can be pushed back into place (reduced). Most hernias tend to get worse over time. Some abdominal hernias can get stuck in the opening (irreducible or incarcerated hernia) and cannot be reduced. An irreducible abdominal hernia which is tightly squeezed into the opening is at risk for impaired blood supply (strangulated hernia). A strangulated hernia is a medical emergency. Because of the risk for an irreducible or strangulated hernia, surgery may be recommended to repair a hernia. CAUSES   Heavy lifting.  Prolonged coughing.  Straining to have a bowel movement.  A cut (incision) made during an abdominal surgery. HOME CARE INSTRUCTIONS   Bed rest is not required. You may continue your normal activities.  Avoid lifting more than 10 pounds (4.5 kg) or straining.  Cough gently. If you are a smoker it is best to stop. Even the best hernia repair can break down with the continual strain of coughing. Even if you do not have your hernia repaired, a cough will continue to aggravate the problem.  Do not wear anything tight over your hernia. Do not try to keep it in with an outside bandage or truss. These can damage abdominal contents if they are trapped within the hernia sac.  Eat a normal diet.  Avoid constipation. Straining over long periods of time will increase hernia size and encourage breakdown of repairs. If you cannot do  this with diet alone, stool softeners may be used. SEEK IMMEDIATE MEDICAL CARE IF:   You have a fever.  You develop increasing abdominal pain.  You feel nauseous or vomit.  Your hernia is stuck outside the abdomen, looks discolored, feels hard, or is tender.  You have any changes in your bowel habits or in the hernia that are unusual for you.  You have increased pain or swelling around the hernia.  You cannot push the hernia back in place by applying gentle pressure while lying down. MAKE SURE YOU:   Understand these instructions.  Will watch your condition.  Will get help right away if you are not doing well or get worse. Document Released: 03/17/2005 Document Revised: 06/09/2011 Document Reviewed: 11/04/2007 [facility] Patient Information 2014 [place], [place].  GETTING TO GOOD BOWEL HEALTH. Irregular bowel habits such as constipation and diarrhea can lead to many problems over time.  Having one soft bowel movement a day is the most important way to prevent further problems.  The anorectal canal is designed to handle stretching and feces to safely manage our ability to get rid of solid waste (feces, poop, stool) out of our body.  BUT, hard constipated stools can act like ripping concrete bricks and diarrhea can be a burning fire to this very sensitive area of our body, causing inflamed hemorrhoids, anal fissures, increasing risk is perirectal abscesses, abdominal pain/bloating, an making irritable bowel worse.     The goal: ONE SOFT BOWEL MOVEMENT A DAY!  To have soft, regular bowel movements:  Drink at least 8 tall glasses of water a day.     Take plenty of fiber.  Fiber is the undigested part of plant food that passes into the colon, acting s "natures broom" to encourage bowel motility and movement.  Fiber can absorb and hold large amounts of water. This results in a larger, bulkier stool, which is soft and easier to pass. Work gradually over several weeks up to 6 servings a day of  fiber (25g a day even more if needed) in the form of: o Vegetables -- Root (potatoes, carrots, turnips), leafy green (lettuce, salad greens, celery, spinach), or cooked high residue (cabbage, broccoli, etc) o Fruit -- Fresh (unpeeled skin & pulp), Dried (prunes, apricots, cherries, etc ),  or stewed ( applesauce)  o Whole grain breads, pasta, etc (whole wheat)  o Bran cereals    Bulking Agents -- This type of water-retaining fiber generally is easily obtained each day by one of the following:  o Psyllium bran -- The psyllium plant is remarkable because its ground seeds can retain so much water. This product is available as Metamucil, Konsyl, Effersyllium, Per Diem Fiber, or the less expensive generic preparation in drug and health food stores. Although labeled a laxative, it really is not a laxative.  o Methylcellulose -- This is another fiber derived from wood which also retains water. It is available as Citrucel. o Polyethylene Glycol - and "artificial" fiber commonly called Miralax or Glycolax.  It is helpful for people with gassy or bloated feelings with regular fiber o Flax Seed - a less gassy fiber than psyllium   No reading or other relaxing activity while on the toilet. If bowel movements take longer than 5 minutes, you are too constipated   AVOID CONSTIPATION.  High fiber and water intake usually takes care of this.  Sometimes a laxative is needed to stimulate more frequent bowel movements, but    Laxatives are not a good long-term solution as it can wear the colon out. o Osmotics (Milk of Magnesia, Fleets phosphosoda, Magnesium citrate, MiraLax, GoLytely) are safer than  o Stimulants (Senokot, Castor Oil, Dulcolax, Ex Lax)    o Do not take laxatives for more than 7days in a row.    IF SEVERELY CONSTIPATED, try a Bowel Retraining Program: o Do not use laxatives.  o Eat a diet high in roughage, such as bran cereals and leafy vegetables.  o Drink six (6) ounces of prune or apricot juice  each morning.  o Eat two (2) large servings of stewed fruit each day.  o Take one (1) heaping tablespoon of a psyllium-based bulking agent twice a day. Use sugar-free sweetener when possible to avoid excessive calories.  o Eat a normal breakfast.  o Set aside 15 minutes after breakfast to sit on the toilet, but do not strain to have a bowel movement.  o If you do not have a bowel movement by the third day, use an enema and repeat the above steps.    Controlling diarrhea o Switch to liquids and simpler foods for a few days to avoid stressing your intestines further. o Avoid dairy products (especially milk & ice cream) for a short time.  The intestines often can lose the ability to digest lactose when stressed. o Avoid foods that cause gassiness or bloating.  Typical foods include beans and other legumes, cabbage, broccoli, and dairy foods.  Every person has some sensitivity to other foods, so listen to our body and avoid those foods that  trigger problems for you. o Adding fiber (Citrucel, Metamucil, psyllium, Miralax) gradually can help thicken stools by absorbing excess fluid and retrain the intestines to act more normally.  Slowly increase the dose over a few weeks.  Too much fiber too soon can backfire and cause cramping & bloating. o Probiotics (such as active yogurt, Align, etc) may help repopulate the intestines and colon with normal bacteria and calm down a sensitive digestive tract.  Most studies show it to be of mild help, though, and such products can be costly. o Medicines:   Bismuth subsalicylate (ex. Kayopectate, Pepto Bismol) every 30 minutes for up to 6 doses can help control diarrhea.  Avoid if pregnant.   Loperamide (Immodium) can slow down diarrhea.  Start with two tablets (4mg  total) first and then try one tablet every 6 hours.  Avoid if you are having fevers or severe pain.  If you are not better or start feeling worse, stop all medicines and call your doctor for advice o Call your  doctor if you are getting worse or not better.  Sometimes further testing (cultures, endoscopy, X-ray studies, bloodwork, etc) may be needed to help diagnose and treat the cause of the diarrhea.   STOP SMOKING!  We strongly recommend that you stop smoking.  Smoking increases the risk of surgery including infection in the form of an open wound, pus formation, abscess, hernia at an incision on the abdomen, etc.  You have an increased risk of other MAJOR complications such as stroke, heart attack, forming clots in the leg and/or lungs, and death.    Smoking Cessation Quitting smoking is important to your health and has many advantages. However, it is not always easy to quit since nicotine is a very addictive drug. Often times, people try 3 times or more before being able to quit. This document explains the best ways for you to prepare to quit smoking. Quitting takes hard work and a lot of effort, but you can do it. ADVANTAGES OF QUITTING SMOKING  You will live longer, feel better, and live better.  Your body will feel the impact of quitting smoking almost immediately.  Within 20 minutes, blood pressure decreases. Your pulse returns to its normal level.  After 8 hours, carbon monoxide levels in the blood return to normal. Your oxygen level increases.  After 24 hours, the chance of having a heart attack starts to decrease. Your breath, hair, and body stop smelling like smoke.  After 48 hours, damaged nerve endings begin to recover. Your sense of taste and smell improve.  After 72 hours, the body is virtually free of nicotine. Your bronchial tubes relax and breathing becomes easier.  After 2 to 12 weeks, lungs can hold more air. Exercise becomes easier and circulation improves.  The risk of having a heart attack, stroke, cancer, or lung disease is greatly reduced.  After 1 year, the risk of coronary heart disease is cut in half.  After 5 years, the risk of stroke falls to the same as a  nonsmoker.  After 10 years, the risk of lung cancer is cut in half and the risk of other cancers decreases significantly.  After 15 years, the risk of coronary heart disease drops, usually to the level of a nonsmoker.  If you are pregnant, quitting smoking will improve your chances of having a healthy baby.  The people you live with, especially any children, will be healthier.  You will have extra money to spend on things other than cigarettes.  QUESTIONS TO THINK ABOUT BEFORE ATTEMPTING TO QUIT You may want to talk about your answers with your caregiver.  Why do you want to quit?  If you tried to quit in the past, what helped and what did not?  What will be the most difficult situations for you after you quit? How will you plan to handle them?  Who can help you through the tough times? Your family? Friends? A caregiver?  What pleasures do you get from smoking? What ways can you still get pleasure if you quit? Here are some questions to ask your caregiver:  How can you help me to be successful at quitting?  What medicine do you think would be best for me and how should I take it?  What should I do if I need more help?  What is smoking withdrawal like? How can I get information on withdrawal? GET READY  Set a quit date.  Change your environment by getting rid of all cigarettes, ashtrays, matches, and lighters in your home, car, or work. Do not let people smoke in your home.  Review your past attempts to quit. Think about what worked and what did not. GET SUPPORT AND ENCOURAGEMENT You have a better chance of being successful if you have help. You can get support in many ways.  Tell your family, friends, and co-workers that you are going to quit and need their support. Ask them not to smoke around you.  Get individual, group, or telephone counseling and support. Programs are available at [employer] and health centers. Call your local health department for information  about programs in your area.  Spiritual beliefs and practices may help some smokers quit.  Download a "quit meter" on your computer to keep track of quit statistics, such as how long you have gone without smoking, cigarettes not smoked, and money saved.  Get a self-help book about quitting smoking and staying off of tobacco. LEARN NEW SKILLS AND BEHAVIORS  Distract yourself from urges to smoke. Talk to someone, go for a walk, or occupy your time with a task.  Change your normal routine. Take a different route to work. Drink tea instead of coffee. Eat breakfast in a different place.  Reduce your stress. Take a hot bath, exercise, or read a book.  Plan something enjoyable to do every day. Reward yourself for not smoking.  Explore interactive web-based programs that specialize in helping you quit. GET MEDICINE AND USE IT CORRECTLY Medicines can help you stop smoking and decrease the urge to smoke. Combining medicine with the above behavioral methods and support can greatly increase your chances of successfully quitting smoking.  Nicotine replacement therapy helps deliver nicotine to your body without the negative effects and risks of smoking. Nicotine replacement therapy includes nicotine gum, lozenges, inhalers, nasal sprays, and skin patches. Some may be available over-the-counter and others require a prescription.  Antidepressant medicine helps people abstain from smoking, but how this works is unknown. This medicine is available by prescription.  Nicotinic receptor partial agonist medicine simulates the effect of nicotine in your brain. This medicine is available by prescription. Ask your caregiver for advice about which medicines to use and how to use them based on your health history. Your caregiver will tell you what side effects to look out for if you choose to be on a medicine or therapy. Carefully read the information on the package. Do not use any other product containing nicotine  while using a nicotine replacement product.  RELAPSE  OR DIFFICULT SITUATIONS Most relapses occur within the first 3 months after quitting. Do not be discouraged if you start smoking again. Remember, most people try several times before finally quitting. You may have symptoms of withdrawal because your body is used to nicotine. You may crave cigarettes, be irritable, feel very hungry, cough often, get headaches, or have difficulty concentrating. The withdrawal symptoms are only temporary. They are strongest when you first quit, but they will go away within 10 14 days. To reduce the chances of relapse, try to:  Avoid drinking alcohol. Drinking lowers your chances of successfully quitting.  Reduce the amount of caffeine you consume. Once you quit smoking, the amount of caffeine in your body increases and can give you symptoms, such as a rapid heartbeat, sweating, and anxiety.  Avoid smokers because they can make you want to smoke.  Do not let weight gain distract you. Many smokers will gain weight when they quit, usually less than 10 pounds. Eat a healthy diet and stay active. You can always lose the weight gained after you quit.  Find ways to improve your mood other than smoking. FOR MORE INFORMATION  www.smokefree.gov    While it can be one of the most difficult things to do, the [place] has programs to help you stop.  Consider talking with your primary care physician about options.  Also, Smoking Cessation classes are available through the [facility]:  The smoking cessation program is a proven-effective program from the American Lung Association. The program is available for anyone 33 and older who currently smokes. The program lasts for 7 weeks and is 8 sessions. Each class will be approximately 1 1/2 hours. The program is every Tuesday.  All classes are 12-1:30pm and same location.  Event Location Information:  Location: [facility] 2nd Floor Conference Room 2-037;  located next to [facility] cross streets: [facility] [address] & [facility] Entrance into the [facility] is adjacent to the [facility] main entrance. The conference room is located on the 2nd floor.  Parking Instructions: Visitor parking is adjacent to [employer] main entrance and the Cancer Center    A smoking cessation program is also offered through the [facility]. Register online at [email or web address] or call [phone] for more information.   Tobacco cessation counseling is available at [facility]. Call [phone] for a free appointment.   Tobacco cessation classes also are available through the [facility] in [place]. For information, call [phone].   The Patient Education Network features videos on tobacco cessation. Please consult your listings in the center of this book to find instructions on how to access this resource.   If you want more information, ask your nurse.      HERNIA REPAIR: POST OP INSTRUCTIONS  DIET: Follow a light bland diet the first 24 hours after arrival home, such as soup, liquids, crackers, etc.  Be sure to include lots of fluids daily.  Avoid fast food or heavy meals as your are more likely to get nauseated.  Eat a low fat the next few days after surgery. Take your usually prescribed home medications unless otherwise directed. PAIN CONTROL: Pain is best controlled by a usual combination of three different methods TOGETHER: Ice/Heat Over the counter pain medication Prescription pain medication Most patients will experience some swelling and bruising around the hernia(s) such as the bellybutton, groins, or old incisions.  Ice packs  or heating pads (30-60 minutes up to 6 times a day) will help. Use ice for the first few days to help decrease swelling and bruising, then switch to heat to help relax tight/sore spots and speed recovery.  Some  people prefer to use ice alone, heat alone, alternating between ice & heat.  Experiment to what works for you.  Swelling and bruising can take several weeks to resolve.   It is helpful to take an over-the-counter pain medication regularly for the first few weeks.  Choose one of the following that works best for you: Naproxen (Aleve, etc)  Two 220mg  tabs twice a day Ibuprofen (Advil, etc) Three 200mg  tabs four times a day (every meal & bedtime) Acetaminophen (Tylenol, etc) 325-650mg  four times a day (every meal & bedtime) A  prescription for pain medication should be given to you upon discharge.  Take your pain medication as prescribed.  If you are having problems/concerns with the prescription medicine (does not control pain, nausea, vomiting, rash, itching, etc), please call us [phone] to see if we need to switch you to a different pain medicine that will work better for you and/or control your side effect better. If you need a refill on your pain medication, please contact your pharmacy.  They will contact our office to request authorization. Prescriptions will not be filled after 5 pm or on week-ends. Avoid getting constipated.  Between the surgery and the pain medications, it is common to experience some constipation.  Increasing fluid intake and taking a fiber supplement (such as Metamucil, Citrucel, FiberCon, MiraLax, etc) 1-2 times a day regularly will usually help prevent this problem from occurring.  A mild laxative (prune juice, Milk of Magnesia, MiraLax, etc) should be taken according to package directions if there are no bowel movements after 48 hours.   Wash / shower every day.  You may shower over the dressings as they are waterproof.   Remove your waterproof bandages 5 days after surgery.  You may leave the incision open to air.  You may replace a dressing/Band-Aid to cover the incision for comfort if you wish.  Continue to shower over incision(s) after the dressing is  off.    ACTIVITIES as tolerated:   You may resume regular (light) daily activities beginning the next day-such as daily self-care, walking, climbing stairs-gradually increasing activities as tolerated.  If you can walk 30 minutes without difficulty, it is safe to try more intense activity such as jogging, treadmill, bicycling, low-impact aerobics, swimming, etc. Save the most intensive and strenuous activity for last such as sit-ups, heavy lifting, contact sports, etc  Refrain from any heavy lifting or straining until you are off narcotics for pain control.   DO NOT PUSH THROUGH PAIN.  Let pain be your guide: If it hurts to do something, don't do it.  Pain is your body warning you to avoid that activity for another week until the pain goes down. You may drive when you are no longer taking prescription pain medication, you can comfortably wear a seatbelt, and you can safely maneuver your car and apply brakes. You may have sexual intercourse when it is comfortable.  FOLLOW UP in our office Please call [facility] at [phone] to set up an appointment to see your surgeon in the office for a follow-up appointment approximately 2-3 weeks after your surgery. Make sure that you call for this appointment the day you arrive home to insure a convenient appointment time. 9.  IF YOU HAVE DISABILITY  OR FAMILY LEAVE FORMS, BRING THEM TO THE OFFICE FOR PROCESSING.  DO NOT GIVE THEM TO YOUR DOCTOR.  WHEN TO CALL us [phone]: Poor pain control Reactions / problems with new medications (rash/itching, nausea, etc)  Fever over 101.5 F (38.5 C) Inability to urinate Nausea and/or vomiting Worsening swelling or bruising Continued bleeding from incision. Increased pain, redness, or drainage from the incision   The clinic staff is available to answer your questions during regular business hours (8:30am-5pm).  Please don't hesitate to call and ask to speak to one of our nurses for clinical concerns.   If you have  a medical emergency, go to the nearest emergency room or call 911.  A surgeon from [facility] is always on call at the hospitals in [facility], [place] [address], [address], [place], [place]  [address] ?  [address] [address], [place], [place]   [address] MAIN: [phone] ? TOLL FREE: [phone] ? FAX: [phone] [email or web address]

## 2013-06-03 ENCOUNTER — Encounter (INDEPENDENT_AMBULATORY_CARE_PROVIDER_SITE_OTHER): Payer: Self-pay | Admitting: Surgery

## 2013-06-03 DIAGNOSIS — F17200 Nicotine dependence, unspecified, uncomplicated: Secondary | ICD-10-CM

## 2013-06-03 DIAGNOSIS — E669 Obesity, unspecified: Secondary | ICD-10-CM

## 2013-06-03 HISTORY — DX: Nicotine dependence, unspecified, uncomplicated: F17.200

## 2013-06-03 HISTORY — DX: Obesity, unspecified: E66.9

## 2013-06-03 NOTE — Progress Notes (Signed)
Subjective:     Patient ID: Cynthia Hess, female   DOB: 04-16-1955, 58 y.o.   MRN: [id number]  HPI  Note: This dictation was prepared with Dragon/digital dictation along with [facility] technology. Any transcriptional errors that result from this process are unintentional.       Cynthia Hess  1955/05/23 [id number]  Patient Care Team: Bobbye Morton, MD as PCP - General (Family Medicine)  This patient is a 58 y.o.female who presents today for surgical evaluation at the request of Dr. Casper Harrison.   Reason for visit: Periumbilical pain with suspicious of recurrent hernia.  Pleasant obese-looking female.  This had numerous abdominal surgeries.  Back in the 1990s, she had a hysterectomy.  Apparently she had had early reoperation for repair of a bladder.  Then she developed bowel obstruction and required lysis of adhesions.  She developed hernias.  She has required most recently in 2007 Open with mesh by Dr. Zachery Dakins and our group who has since retired.    She some developed some abdominal pain and swelling.  She is known to have a hernia by CT scan in 2013, incarcerated with fat.  She sugars with chronic constipation having a bowel movement twice a week at best.  No history of cardiac issues but can only walk 10 minutes before severe back pain makes her stop.  No history of wound infections that she can recall.  No personal nor family history of GI/colon cancer, inflammatory bowel disease, irritable bowel syndrome, allergy such as Celiac Sprue, dietary/dairy problems, colitis, ulcers nor gastritis.  No recent sick contacts/gastroenteritis.  No travel outside the country.  No changes in diet.  No dysphagia to solids or liquids.  No significant heartburn or reflux.  No hematochezia, hematemesis, coffee ground emesis.  No evidence of prior gastric/peptic ulceration.    Patient Active Problem List   Diagnosis Date Noted  . Obesity (BMI 30-39.9) 06/03/2013  . Periumbilical hernia 03/30/2013    . Lumbosacral spondylosis without myelopathy 10/03/2011  . Sciatica 10/03/2011  . DEPRESSION 02/19/2010  . TRANSAMINASES, SERUM, ELEVATED 02/19/2010  . WRIST PAIN, BILATERAL 01/09/2010  . TOBACCO ABUSE 09/04/2008  . CONSTIPATION 09/04/2008  . CHEST PAIN 09/04/2008  . HERNIATED LUMBOSACRAL DISC 05/02/2008  . DEGENERATIVE JOINT DISEASE, LUMBAR SPINE 02/09/2008  . SUBACROMIAL BURSITIS, LEFT 10/30/2007  . DEGENERATIVE DISC DISEASE, CERVICAL SPINE 10/18/2007  . VENTRAL HERNIA 05/21/2007  . MICROSCOPIC HEMATURIA 04/22/2007  . TRICHOMONIASIS 04/22/2007  . PAIN IN SOFT TISSUES OF LIMB 04/15/2007    Past Medical History  Diagnosis Date  . Arthritis     in low back  . Hypertension 2009  . Asthma 2009  . Hyperlipidemia 2013  . HERNIATED LUMBOSACRAL DISC 05/02/2008    Qualifier: Diagnosis of  By: Shannon, [place]    . COSTOCHONDRITIS 09/04/2008    Qualifier: Diagnosis of  By: Daphine Deutscher FNP, Zena Amos    . NEPHROLITHIASIS 05/21/2007    Annotation: bilateral Qualifier: Diagnosis of  By: Daphine Deutscher FNP, Zena Amos    . DENTAL CARIES 02/04/2008    Qualifier: Diagnosis of  By: Daphine Deutscher FNP, Zena Amos    . CLOSED FRACTURE OF METATARSAL BONE 12/29/2008    Annotation: minimally displaced fracture through the fifth metatarsal Qualifier: Diagnosis of  By: Daphine Deutscher FNP, Zena Amos      Past Surgical History  Procedure Laterality Date  . Hernia repair  2007    reports total of 3 surgeries, most recently 2007  . Abdominal hysterectomy  1989    still gets pap smears  .  Small intestine surgery  1989    for "blockage"  . Bladder surgery    . Tubal ligation  1979  . Tonsillectomy  1967    History   Social History  . Marital Status: Single    Spouse Name: N/A    Number of Children: N/A  . Years of Education: N/A   Occupational History  . Not on file.   Social History Main Topics  . Smoking status: Current Every Day Smoker -- 0.50 packs/day    Types: Cigarettes  . Smokeless tobacco: Never Used     Comment:  cutting back  . Alcohol Use: Yes     Comment: Drinks 2x/month- usually 3-4 beers at a time   . Drug Use: No  . Sexual Activity: Not Currently    Partners: Male    Birth Control/ Protection: Surgical   Other Topics Concern  . Not on file   Social History Narrative   Previous MD was Dr. Philipp Deputy, last seen 08/2011; heard about [place] from a friend   Is coming for her back and arm      Has notes from Dr. Wynn Banker (chronic pain mgmt)- non-narcotic txt only per most recent note 09/2011 due to UDS + for codeine, butalbital, ethanol      CHRONIC BACK PAIN PATIENT      Is currently unemployed, finished some high school    On disability for her back- had a fall in 2010       Lives with 70 yo daughter, Cynthia Hess   Uses cigarettes and EtOH, denies drugs      Most recent mammo 2012, pap 2011, CT/MRI 2012 and 2013 for back and hernia       Current meds:   Lisinopril 40 qd   Clonidine 0.1mg  qhs   Tramadol 50mg  1q6 but she is taking it q3   Ibuprofen 800mg  1-2 times per day, but she is taking it every 4 hours    Family History  Problem Relation Age of Onset  . Asthma Brother   . Diabetes Brother   . Hypertension Brother   . Alzheimer's disease Maternal Grandmother   . Early death Mother     when pt was 51; poisoned  . Early death Father     when pt was 66, unknown   . Hypertension Brother     Current Outpatient Prescriptions  Medication Sig Dispense Refill  . acetaminophen (TYLENOL) 650 MG CR tablet Take 650 mg by mouth every 8 (eight) hours.      . hydrochlorothiazide (HYDRODIURIL) 25 MG tablet Take 1 tablet (25 mg total) by mouth daily.  90 tablet  3  . lisinopril (PRINIVIL,ZESTRIL) 40 MG tablet Take 1 tablet (40 mg total) by mouth daily.  30 tablet  3  . meloxicam (MOBIC) 15 MG tablet Take 1 tablet (15 mg total) by mouth daily.  30 tablet  1  . naproxen (NAPROSYN) 375 MG tablet       . omeprazole (PRILOSEC) 40 MG capsule Take 1 capsule (40 mg total) by mouth daily.  90 capsule  3   . traMADol (ULTRAM) 50 MG tablet Take 1-2 tablets (50-100 mg total) by mouth every 6 (six) hours as needed.  160 tablet  0   No current facility-administered medications for this visit.     Allergies  Allergen Reactions  . Latex Itching and Swelling    BP 116/80  Pulse 75  Temp(Src) 98.2 F (36.8 C) (Oral)  Ht 5\' 2"  (1.575 m)  Wt 167 lb (75.751 kg)  BMI 30.54 kg/m2  No results found.   Review of Systems  Constitutional: Negative for fever, chills, diaphoresis, appetite change and fatigue.  HENT: Negative for ear discharge, ear pain, sore throat and trouble swallowing.   Eyes: Negative for photophobia, discharge and visual disturbance.  Respiratory: Negative for cough, choking, chest tightness and shortness of breath.   Cardiovascular: Negative for chest pain and palpitations.  Gastrointestinal: Negative for nausea, vomiting, abdominal pain, diarrhea, constipation, anal bleeding and rectal pain.  Endocrine: Negative for cold intolerance and heat intolerance.  Genitourinary: Negative for dysuria, frequency and difficulty urinating.  Musculoskeletal: Positive for arthralgias, back pain and myalgias. Negative for gait problem and neck pain.  Skin: Negative for color change, pallor and rash.  Allergic/Immunologic: Negative for environmental allergies, food allergies and immunocompromised state.  Neurological: Negative for dizziness, speech difficulty, weakness and numbness.  Hematological: Negative for adenopathy. Bruises/bleeds easily.  Psychiatric/Behavioral: Negative for confusion and agitation. The patient is not nervous/anxious.        Objective:   Physical Exam  Constitutional: She is oriented to person, place, and time. She appears well-developed and well-nourished. No distress.  HENT:  Head: Normocephalic.  Mouth/Throat: Oropharynx is clear and moist. No oropharyngeal exudate.  Eyes: Conjunctivae and EOM are normal. Pupils are equal, round, and reactive to light. No  scleral icterus.  Neck: Normal range of motion. Neck supple. No tracheal deviation present.  Cardiovascular: Normal rate, regular rhythm and intact distal pulses.   Pulmonary/Chest: Effort normal and breath sounds normal. No stridor. No respiratory distress. She exhibits no tenderness.  Abdominal: Soft. She exhibits no distension and no mass. There is tenderness in the periumbilical area. There is no rigidity, no rebound, no guarding, no tenderness at McBurney's point and negative Murphy's sign. A hernia is present. Hernia confirmed positive in the ventral area. Hernia confirmed negative in the right inguinal area and confirmed negative in the left inguinal area.    Genitourinary: No vaginal discharge found.  Musculoskeletal: Normal range of motion. She exhibits no tenderness.       Right elbow: She exhibits normal range of motion.       Left elbow: She exhibits normal range of motion.       Right wrist: She exhibits normal range of motion.       Left wrist: She exhibits normal range of motion.       Right hand: Normal strength noted.       Left hand: Normal strength noted.  Lymphadenopathy:       Head (right side): No posterior auricular adenopathy present.       Head (left side): No posterior auricular adenopathy present.    She has no cervical adenopathy.    She has no axillary adenopathy.       Right: No inguinal adenopathy present.       Left: No inguinal adenopathy present.  Neurological: She is alert and oriented to person, place, and time. No cranial nerve deficit. She exhibits normal muscle tone. Coordination normal.  Skin: Skin is warm and dry. No rash noted. She is not diaphoretic. No erythema.  Psychiatric: She has a normal mood and affect. Her behavior is normal. Judgment and thought content normal.       Assessment:     Supraumbilical and periumbilical cursory ventral hernias and wound with numerous prior abdominal surgeries.     Plan:     I think she would benefit  from laparoscopic possible open exploration  and repair of hernias.  Her risks of bowel injury and other issues are higher  With her prior abdominal surgeries and her repairs.  I would use mesh this time since he will not be emergent situation hopefully we can avoid a recurrence despite her obesity and tobacco abuse.  I strongly recommended she quit smoking.  She is interested in surgery:  The anatomy & physiology of the abdominal wall was discussed.  The pathophysiology of hernias was discussed.  Natural history risks without surgery including progeressive enlargement, pain, incarceration & strangulation was discussed.   Contributors to complications such as smoking, obesity, diabetes, prior surgery, etc were discussed.   I feel the risks of no intervention will lead to serious problems that outweigh the operative risks; therefore, I recommended surgery to reduce and repair the hernia.  I explained laparoscopic techniques with possible need for an open approach.  I noted the probable use of mesh to patch and/or buttress the hernia repair  Risks such as bleeding, infection, abscess, need for further treatment, heart attack, death, and other risks were discussed.  I noted a good likelihood this will help address the problem.   Goals of post-operative recovery were discussed as well.  Possibility that this will not correct all symptoms was explained.  I stressed the importance of low-impact activity, aggressive pain control, avoiding constipation, & not pushing through pain to minimize risk of post-operative chronic pain or injury. Possibility of reherniation especially with smoking, obesity, diabetes, immunosuppression, and other health conditions was discussed.  We will work to minimize complications.     An educational handout further explaining the pathology & treatment options was given as well.  Questions were answered.  The patient expresses understanding & wishes to proceed with surgery.  STOP  SMOKING! We talked to the patient about the dangers of smoking.  We stressed that tobacco use dramatically increases the risk of peri-operative complications such as infection, tissue necrosis leaving to problems with incision/wound and organ healing, hernia, chronic pain, heart attack, stroke, DVT, pulmonary embolism, and death.  We noted there are programs in our community to help stop smoking.  Information was available.

## 2013-06-13 ENCOUNTER — Encounter (HOSPITAL_COMMUNITY): Payer: Self-pay | Admitting: Pharmacy Technician

## 2013-06-15 ENCOUNTER — Other Ambulatory Visit (INDEPENDENT_AMBULATORY_CARE_PROVIDER_SITE_OTHER): Payer: Self-pay | Admitting: Surgery

## 2013-06-15 NOTE — Patient Instructions (Signed)
      Your procedure is scheduled on:  06/24/13  FRIDAY  Report to [facility] at   0920    AM.  Call this number if you have problems the morning of surgery: [phone]        Do not eat food  Or drink :After Midnight. Thursday NIGHT   Take these medicines the morning of surgery with A SIP OF WATER: NONE   .  Contacts, dentures or partial plates, or metal hairpins  can not be worn to surgery. Your family will be responsible for glasses, dentures, hearing aides while you are in surgery  Leave suitcase in the car. After surgery it may be brought to your room.  For patients admitted to the hospital, checkout time is 11:00 AM day of  discharge.                DO NOT WEAR JEWELRY, LOTIONS, POWDERS, OR PERFUMES.  WOMEN-- DO NOT SHAVE LEGS OR UNDERARMS FOR 48 HOURS BEFORE SHOWERS. MEN MAY SHAVE FACE.  Patients discharged the day of surgery will not be allowed to drive home. IF going home the day of surgery, you must have a driver and someone to stay with you for the first 24 hours  Name and phone number of your driver:         Overnight stay                                                                                   FAILURE TO FOLLOW THESE INSTRUCTIONS MAY RESULT IN  CANCELLATION   OF YOUR SURGERY                                                  Patient Signature _____________________________

## 2013-06-15 NOTE — Progress Notes (Signed)
Dr Michaell CowingGross-  On pre op order for consent, it reads repairs of hernias AND AN...... The rest is blank.  Do you want to add any thing else?   I see her for pre op 06/16/13  Thank you

## 2013-06-16 ENCOUNTER — Ambulatory Visit (HOSPITAL_COMMUNITY)
Admission: RE | Admit: 2013-06-16 | Discharge: 2013-06-16 | Disposition: A | Discharge status: Home / Self Care | Payer: Medicare Other | Source: Ambulatory Visit | Attending: Surgery | Admitting: Surgery

## 2013-06-16 ENCOUNTER — Encounter (HOSPITAL_COMMUNITY)
Admission: RE | Admit: 2013-06-16 | Discharge: 2013-06-16 | Disposition: A | Discharge status: Home / Self Care | Payer: Medicare Other | Source: Ambulatory Visit | Attending: Surgery | Admitting: Surgery

## 2013-06-16 ENCOUNTER — Encounter (HOSPITAL_COMMUNITY): Payer: Self-pay

## 2013-06-16 DIAGNOSIS — Z01810 Encounter for preprocedural cardiovascular examination: Secondary | ICD-10-CM | POA: Insufficient documentation

## 2013-06-16 DIAGNOSIS — K439 Ventral hernia without obstruction or gangrene: Secondary | ICD-10-CM | POA: Insufficient documentation

## 2013-06-16 DIAGNOSIS — R9431 Abnormal electrocardiogram [ECG] [EKG]: Secondary | ICD-10-CM | POA: Diagnosis not present

## 2013-06-16 DIAGNOSIS — Z01812 Encounter for preprocedural laboratory examination: Secondary | ICD-10-CM | POA: Diagnosis not present

## 2013-06-16 DIAGNOSIS — Z01818 Encounter for other preprocedural examination: Secondary | ICD-10-CM | POA: Insufficient documentation

## 2013-06-16 HISTORY — DX: Major depressive disorder, single episode, unspecified: F32.9

## 2013-06-16 HISTORY — DX: Depression, unspecified: F32.A

## 2013-06-16 LAB — BASIC METABOLIC PANEL
BUN: 16 mg/dL (ref 6–23)
CHLORIDE: 105 meq/L (ref 96–112)
CO2: 24 mEq/L (ref 19–32)
CREATININE: 0.71 mg/dL (ref 0.50–1.10)
Calcium: 9.3 mg/dL (ref 8.4–10.5)
GFR calc Af Amer: >90 mL/min (ref >90)
GFR calc non Af Amer: >90 mL/min (ref >90)
Glucose, Bld: 99 mg/dL (ref 70–99)
POTASSIUM: 3.3 meq/L — AB (ref 3.7–5.3)
SODIUM: 143 meq/L (ref 137–147)

## 2013-06-16 LAB — CBC
HCT: 36.2 % (ref 36.0–46.0)
Hemoglobin: 12.7 g/dL (ref 12.0–15.0)
MCH: 33.9 pg (ref 26.0–34.0)
MCHC: 35.1 g/dL (ref 30.0–36.0)
MCV: 96.5 fL (ref 78.0–100.0)
PLATELETS: 373 10*3/uL (ref 150–400)
RBC: 3.75 MIL/uL — ABNORMAL LOW (ref 3.87–5.11)
RDW: 12.9 % (ref 11.5–15.5)
WBC: 6.4 10*3/uL (ref 4.0–10.5)

## 2013-06-23 MED ORDER — BUPIVACAINE 0.25 % ON-Q PUMP DUAL CATH 300 ML
300.0000 mL | INJECTION | Status: DC
  Filled 2013-06-23: qty 300

## 2013-06-24 ENCOUNTER — Ambulatory Visit (HOSPITAL_COMMUNITY): Payer: Medicare Other | Admitting: Anesthesiology

## 2013-06-24 ENCOUNTER — Encounter (HOSPITAL_COMMUNITY)
Admission: RE | Disposition: A | Discharge status: Home / Self Care | Payer: Self-pay | Source: Ambulatory Visit | Attending: Surgery

## 2013-06-24 ENCOUNTER — Encounter (HOSPITAL_COMMUNITY): Payer: Medicare Other | Admitting: Anesthesiology

## 2013-06-24 ENCOUNTER — Encounter (HOSPITAL_COMMUNITY): Payer: Self-pay | Admitting: *Deleted

## 2013-06-24 ENCOUNTER — Observation Stay (HOSPITAL_COMMUNITY)
Admission: RE | Admit: 2013-06-24 | Discharge: 2013-06-25 | Disposition: A | Discharge status: Home / Self Care | Payer: Medicare Other | Source: Ambulatory Visit | Attending: Surgery | Admitting: Surgery

## 2013-06-24 DIAGNOSIS — K430 Incisional hernia with obstruction, without gangrene: Principal | ICD-10-CM | POA: Diagnosis present

## 2013-06-24 DIAGNOSIS — K429 Umbilical hernia without obstruction or gangrene: Secondary | ICD-10-CM

## 2013-06-24 DIAGNOSIS — Z8719 Personal history of other diseases of the digestive system: Secondary | ICD-10-CM

## 2013-06-24 DIAGNOSIS — E669 Obesity, unspecified: Secondary | ICD-10-CM | POA: Insufficient documentation

## 2013-06-24 DIAGNOSIS — Z9889 Other specified postprocedural states: Secondary | ICD-10-CM

## 2013-06-24 DIAGNOSIS — K432 Incisional hernia without obstruction or gangrene: Secondary | ICD-10-CM | POA: Diagnosis not present

## 2013-06-24 DIAGNOSIS — E785 Hyperlipidemia, unspecified: Secondary | ICD-10-CM | POA: Diagnosis not present

## 2013-06-24 DIAGNOSIS — Z90710 Acquired absence of both cervix and uterus: Secondary | ICD-10-CM | POA: Diagnosis not present

## 2013-06-24 DIAGNOSIS — I10 Essential (primary) hypertension: Secondary | ICD-10-CM | POA: Insufficient documentation

## 2013-06-24 DIAGNOSIS — F329 Major depressive disorder, single episode, unspecified: Secondary | ICD-10-CM

## 2013-06-24 DIAGNOSIS — Z79899 Other long term (current) drug therapy: Secondary | ICD-10-CM | POA: Diagnosis not present

## 2013-06-24 DIAGNOSIS — F17200 Nicotine dependence, unspecified, uncomplicated: Secondary | ICD-10-CM | POA: Diagnosis not present

## 2013-06-24 DIAGNOSIS — K5900 Constipation, unspecified: Secondary | ICD-10-CM

## 2013-06-24 DIAGNOSIS — K660 Peritoneal adhesions (postprocedural) (postinfection): Secondary | ICD-10-CM | POA: Diagnosis not present

## 2013-06-24 DIAGNOSIS — F3289 Other specified depressive episodes: Secondary | ICD-10-CM

## 2013-06-24 DIAGNOSIS — K439 Ventral hernia without obstruction or gangrene: Secondary | ICD-10-CM

## 2013-06-24 HISTORY — DX: Personal history of other diseases of the digestive system: Z87.19

## 2013-06-24 HISTORY — PX: VENTRAL HERNIA REPAIR: SHX424

## 2013-06-24 HISTORY — PX: INSERTION OF MESH: SHX5868

## 2013-06-24 SURGERY — REPAIR, HERNIA, VENTRAL, LAPAROSCOPIC
Anesthesia: General | Site: Abdomen

## 2013-06-24 MED ORDER — ONDANSETRON HCL 4 MG/2ML IJ SOLN
INTRAMUSCULAR | Status: AC
  Filled 2013-06-24: qty 2

## 2013-06-24 MED ORDER — CEFAZOLIN SODIUM-DEXTROSE 2-3 GM-% IV SOLR
2.0000 g | INTRAVENOUS | Status: AC
  Administered 2013-06-24: 2 g via INTRAVENOUS

## 2013-06-24 MED ORDER — MIDAZOLAM HCL 5 MG/5ML IJ SOLN
INTRAMUSCULAR | Status: DC | PRN
  Administered 2013-06-24: 2 mg via INTRAVENOUS

## 2013-06-24 MED ORDER — CHLORHEXIDINE GLUCONATE 4 % EX LIQD: 1.0000 "application " | Freq: Once | CUTANEOUS | Status: DC

## 2013-06-24 MED ORDER — ONDANSETRON 4 MG PO TBDP
4.0000 mg | ORAL_TABLET | Freq: Four times a day (QID) | ORAL | Status: DC | PRN
  Filled 2013-06-24: qty 2

## 2013-06-24 MED ORDER — IBUPROFEN 600 MG PO TABS
600.0000 mg | ORAL_TABLET | Freq: Four times a day (QID) | ORAL | Status: DC | PRN
Start: 2013-06-24 — End: 2013-06-25
  Administered 2013-06-25: 600 mg via ORAL
  Filled 2013-06-24: qty 1

## 2013-06-24 MED ORDER — MAGIC MOUTHWASH
15.0000 mL | Freq: Four times a day (QID) | ORAL | Status: DC | PRN
  Filled 2013-06-24: qty 15

## 2013-06-24 MED ORDER — VITAMINS A & D EX OINT
TOPICAL_OINTMENT | CUTANEOUS | Status: AC
  Administered 2013-06-24: 5
  Filled 2013-06-24: qty 5

## 2013-06-24 MED ORDER — PROMETHAZINE HCL 25 MG/ML IJ SOLN
6.2500 mg | INTRAMUSCULAR | Status: DC | PRN
  Administered 2013-06-24: 12.5 mg via INTRAVENOUS

## 2013-06-24 MED ORDER — SODIUM CHLORIDE 0.9 % IJ SOLN
3.0000 mL | Freq: Two times a day (BID) | INTRAMUSCULAR | Status: DC
  Administered 2013-06-24: 3 mL via INTRAVENOUS

## 2013-06-24 MED ORDER — MIDAZOLAM HCL 2 MG/2ML IJ SOLN
INTRAMUSCULAR | Status: AC
  Filled 2013-06-24: qty 2

## 2013-06-24 MED ORDER — ACETAMINOPHEN 500 MG PO TABS: 500.0000 mg | ORAL_TABLET | Freq: Four times a day (QID) | ORAL | Status: DC | PRN

## 2013-06-24 MED ORDER — PROPOFOL 10 MG/ML IV BOLUS
INTRAVENOUS | Status: DC | PRN
  Administered 2013-06-24: 180 mg via INTRAVENOUS

## 2013-06-24 MED ORDER — LIDOCAINE HCL (CARDIAC) 20 MG/ML IV SOLN
INTRAVENOUS | Status: DC | PRN
  Administered 2013-06-24: 50 mg via INTRAVENOUS

## 2013-06-24 MED ORDER — ALUM & MAG HYDROXIDE-SIMETH 200-200-20 MG/5ML PO SUSP: 30.0000 mL | Freq: Four times a day (QID) | ORAL | Status: DC | PRN

## 2013-06-24 MED ORDER — HYDROMORPHONE HCL PF 2 MG/ML IJ SOLN
INTRAMUSCULAR | Status: AC
  Filled 2013-06-24: qty 1

## 2013-06-24 MED ORDER — BUPIVACAINE 0.25 % ON-Q PUMP DUAL CATH 300 ML
300.0000 mL | INJECTION | Status: DC
  Filled 2013-06-24: qty 300

## 2013-06-24 MED ORDER — HYDROCHLOROTHIAZIDE 25 MG PO TABS
25.0000 mg | ORAL_TABLET | Freq: Every day | ORAL | Status: DC
  Administered 2013-06-24: 25 mg via ORAL
  Filled 2013-06-24 (×2): qty 1

## 2013-06-24 MED ORDER — ROCURONIUM BROMIDE 100 MG/10ML IV SOLN
INTRAVENOUS | Status: DC | PRN
  Administered 2013-06-24: 10 mg via INTRAVENOUS
  Administered 2013-06-24: 35 mg via INTRAVENOUS
  Administered 2013-06-24: 5 mg via INTRAVENOUS

## 2013-06-24 MED ORDER — BUPIVACAINE-EPINEPHRINE 0.25% -1:200000 IJ SOLN
INTRAMUSCULAR | Status: AC
  Filled 2013-06-24: qty 2

## 2013-06-24 MED ORDER — HYDROMORPHONE HCL 2 MG PO TABS
2.0000 mg | ORAL_TABLET | ORAL | Status: DC | PRN
  Administered 2013-06-25 (×3): 4 mg via ORAL
  Filled 2013-06-24 (×3): qty 2

## 2013-06-24 MED ORDER — ROCURONIUM BROMIDE 100 MG/10ML IV SOLN
INTRAVENOUS | Status: AC
  Filled 2013-06-24: qty 1

## 2013-06-24 MED ORDER — LIP MEDEX EX OINT
1.0000 "application " | TOPICAL_OINTMENT | Freq: Two times a day (BID) | CUTANEOUS | Status: DC
  Administered 2013-06-24 – 2013-06-25 (×2): 1 via TOPICAL
  Filled 2013-06-24: qty 7

## 2013-06-24 MED ORDER — SODIUM CHLORIDE 0.9 % IV SOLN: 250.0000 mL | INTRAVENOUS | Status: DC | PRN

## 2013-06-24 MED ORDER — POLYETHYLENE GLYCOL 3350 17 G PO PACK
17.0000 g | PACK | Freq: Two times a day (BID) | ORAL | Status: DC
  Administered 2013-06-24 – 2013-06-25 (×2): 17 g via ORAL
  Filled 2013-06-24 (×3): qty 1

## 2013-06-24 MED ORDER — MENTHOL 3 MG MT LOZG: 1.0000 | LOZENGE | OROMUCOSAL | Status: DC | PRN

## 2013-06-24 MED ORDER — DIPHENHYDRAMINE HCL 50 MG/ML IJ SOLN: 12.5000 mg | Freq: Four times a day (QID) | INTRAMUSCULAR | Status: DC | PRN

## 2013-06-24 MED ORDER — ONDANSETRON 8 MG/NS 50 ML IVPB
8.0000 mg | Freq: Four times a day (QID) | INTRAVENOUS | Status: DC | PRN
  Filled 2013-06-24 (×2): qty 8

## 2013-06-24 MED ORDER — KETOROLAC TROMETHAMINE 30 MG/ML IJ SOLN
INTRAMUSCULAR | Status: AC
  Filled 2013-06-24: qty 1

## 2013-06-24 MED ORDER — HYDROMORPHONE HCL 4 MG PO TABS: 4.0000 mg | ORAL_TABLET | ORAL | Status: DC | PRN

## 2013-06-24 MED ORDER — FENTANYL CITRATE 0.05 MG/ML IJ SOLN
25.0000 ug | INTRAMUSCULAR | Status: DC | PRN
  Administered 2013-06-24 (×2): 50 ug via INTRAVENOUS
  Filled 2013-06-24 (×2): qty 2

## 2013-06-24 MED ORDER — LACTATED RINGERS IV BOLUS (SEPSIS): 1000.0000 mL | Freq: Three times a day (TID) | INTRAVENOUS | Status: DC | PRN

## 2013-06-24 MED ORDER — FENTANYL CITRATE 0.05 MG/ML IJ SOLN
INTRAMUSCULAR | Status: AC
  Filled 2013-06-24: qty 5

## 2013-06-24 MED ORDER — BUPIVACAINE 0.25 % ON-Q PUMP DUAL CATH 300 ML
300.0000 mL | INJECTION | Status: DC
  Administered 2013-06-24: 300 mL
  Filled 2013-06-24: qty 300

## 2013-06-24 MED ORDER — NEOSTIGMINE METHYLSULFATE 1 MG/ML IJ SOLN
INTRAMUSCULAR | Status: DC | PRN
  Administered 2013-06-24: 4 mg via INTRAVENOUS

## 2013-06-24 MED ORDER — CEFAZOLIN SODIUM-DEXTROSE 2-3 GM-% IV SOLR
INTRAVENOUS | Status: AC
  Filled 2013-06-24: qty 50

## 2013-06-24 MED ORDER — FENTANYL CITRATE 0.05 MG/ML IJ SOLN
INTRAMUSCULAR | Status: DC | PRN
  Administered 2013-06-24 (×3): 50 ug via INTRAVENOUS
  Administered 2013-06-24: 100 ug via INTRAVENOUS

## 2013-06-24 MED ORDER — HYDROMORPHONE HCL PF 1 MG/ML IJ SOLN
INTRAMUSCULAR | Status: DC | PRN
  Administered 2013-06-24 (×2): 0.5 mg via INTRAVENOUS
  Administered 2013-06-24: 1 mg via INTRAVENOUS

## 2013-06-24 MED ORDER — NAPROXEN 500 MG PO TABS: 500.0000 mg | ORAL_TABLET | Freq: Two times a day (BID) | ORAL | Status: DC

## 2013-06-24 MED ORDER — KETOROLAC TROMETHAMINE 30 MG/ML IJ SOLN
INTRAMUSCULAR | Status: DC | PRN
  Administered 2013-06-24: 30 mg via INTRAVENOUS

## 2013-06-24 MED ORDER — ONDANSETRON HCL 4 MG/2ML IJ SOLN
INTRAMUSCULAR | Status: DC | PRN
  Administered 2013-06-24: 4 mg via INTRAVENOUS

## 2013-06-24 MED ORDER — SODIUM CHLORIDE 0.9 % IJ SOLN: 3.0000 mL | INTRAMUSCULAR | Status: DC | PRN

## 2013-06-24 MED ORDER — LACTATED RINGERS IV SOLN
INTRAVENOUS | Status: DC
  Administered 2013-06-24: 14:00:00 via INTRAVENOUS
  Administered 2013-06-24: 1000 mL via INTRAVENOUS
  Administered 2013-06-24: 16:00:00 via INTRAVENOUS

## 2013-06-24 MED ORDER — GLYCOPYRROLATE 0.2 MG/ML IJ SOLN
INTRAMUSCULAR | Status: DC | PRN
  Administered 2013-06-24: 0.6 mg via INTRAVENOUS

## 2013-06-24 MED ORDER — PROPOFOL 10 MG/ML IV BOLUS
INTRAVENOUS | Status: AC
  Filled 2013-06-24: qty 20

## 2013-06-24 MED ORDER — METOPROLOL TARTRATE 1 MG/ML IV SOLN
5.0000 mg | Freq: Four times a day (QID) | INTRAVENOUS | Status: DC | PRN
  Filled 2013-06-24: qty 5

## 2013-06-24 MED ORDER — ACETAMINOPHEN 500 MG PO TABS
1000.0000 mg | ORAL_TABLET | Freq: Three times a day (TID) | ORAL | Status: AC
  Administered 2013-06-24 – 2013-06-25 (×2): 1000 mg via ORAL
  Filled 2013-06-24 (×2): qty 2

## 2013-06-24 MED ORDER — SUCCINYLCHOLINE CHLORIDE 20 MG/ML IJ SOLN
INTRAMUSCULAR | Status: DC | PRN
  Administered 2013-06-24: 100 mg via INTRAVENOUS

## 2013-06-24 MED ORDER — BUPIVACAINE-EPINEPHRINE 0.25% -1:200000 IJ SOLN
INTRAMUSCULAR | Status: DC | PRN
  Administered 2013-06-24: 10 mL
  Administered 2013-06-24: 65 mL

## 2013-06-24 MED ORDER — PROMETHAZINE HCL 25 MG/ML IJ SOLN
INTRAMUSCULAR | Status: AC
  Filled 2013-06-24: qty 1

## 2013-06-24 MED ORDER — LIDOCAINE HCL (CARDIAC) 20 MG/ML IV SOLN
INTRAVENOUS | Status: AC
  Filled 2013-06-24: qty 5

## 2013-06-24 MED ORDER — ONDANSETRON HCL 4 MG/2ML IJ SOLN
4.0000 mg | Freq: Four times a day (QID) | INTRAMUSCULAR | Status: DC | PRN
  Administered 2013-06-24 – 2013-06-25 (×4): 4 mg via INTRAVENOUS
  Filled 2013-06-24 (×4): qty 2

## 2013-06-24 MED ORDER — BISACODYL 10 MG RE SUPP
10.0000 mg | Freq: Two times a day (BID) | RECTAL | Status: DC | PRN
  Administered 2013-06-25: 10 mg via RECTAL
  Filled 2013-06-24: qty 1

## 2013-06-24 MED ORDER — LISINOPRIL 40 MG PO TABS
40.0000 mg | ORAL_TABLET | Freq: Every morning | ORAL | Status: DC
  Administered 2013-06-25: 40 mg via ORAL
  Filled 2013-06-24: qty 1

## 2013-06-24 MED ORDER — HYDROMORPHONE HCL PF 1 MG/ML IJ SOLN: 0.2500 mg | INTRAMUSCULAR | Status: DC | PRN

## 2013-06-24 MED ORDER — PHENOL 1.4 % MT LIQD: 2.0000 | OROMUCOSAL | Status: DC | PRN

## 2013-06-24 SURGICAL SUPPLY — 55 items
APPLIER CLIP 5 13 M/L LIGAMAX5 (MISCELLANEOUS)
APR CLP MED LRG 5 ANG JAW (MISCELLANEOUS)
BINDER ABDOMINAL 12 ML 46-62 (SOFTGOODS) ×1 IMPLANT
BLADE HEX COATED 2.75 (ELECTRODE) IMPLANT
CABLE HI FREQUENCY MONOPOLAR (ELECTROSURGICAL) ×2 IMPLANT
CANISTER SUCTION 2500CC (MISCELLANEOUS) ×2 IMPLANT
CATH FOLEY LATEX FREE 16FR (CATHETERS) ×1 IMPLANT
CATH KIT ON Q 7.5IN SLV (PAIN MANAGEMENT) ×2 IMPLANT
CHLORAPREP W/TINT 26ML (MISCELLANEOUS) ×2 IMPLANT
CLIP APPLIE 5 13 M/L LIGAMAX5 (MISCELLANEOUS) IMPLANT
DECANTER SPIKE VIAL GLASS SM (MISCELLANEOUS) ×2 IMPLANT
DEVICE SECURE STRAP 25 ABSORB (INSTRUMENTS) ×1 IMPLANT
DEVICE TROCAR PUNCTURE CLOSURE (ENDOMECHANICALS) ×2 IMPLANT
DRAPE LAPAROSCOPIC ABDOMINAL (DRAPES) ×2 IMPLANT
DRAPE UTILITY XL STRL (DRAPES) ×3 IMPLANT
DRAPE WARM FLUID 44X44 (DRAPE) ×2 IMPLANT
DRSG TEGADERM 2-3/8X2-3/4 SM (GAUZE/BANDAGES/DRESSINGS) ×4 IMPLANT
DRSG TEGADERM 4X4.75 (GAUZE/BANDAGES/DRESSINGS) ×1 IMPLANT
ELECT REM PT RETURN 9FT ADLT (ELECTROSURGICAL) ×2
ELECTRODE REM PT RTRN 9FT ADLT (ELECTROSURGICAL) ×1 IMPLANT
GAUZE SPONGE 2X2 8PLY STRL LF (GAUZE/BANDAGES/DRESSINGS) IMPLANT
GLOVE BIOGEL PI IND STRL 7.0 (GLOVE) IMPLANT
GLOVE BIOGEL PI IND STRL 7.5 (GLOVE) IMPLANT
GLOVE BIOGEL PI IND STRL 8 (GLOVE) IMPLANT
GLOVE BIOGEL PI INDICATOR 7.0 (GLOVE) ×5
GLOVE BIOGEL PI INDICATOR 7.5 (GLOVE) ×2
GLOVE BIOGEL PI INDICATOR 8 (GLOVE) ×2
GLOVE ECLIPSE 8.0 STRL XLNG CF (GLOVE) ×1 IMPLANT
GLOVE INDICATOR 8.0 STRL GRN (GLOVE) ×1 IMPLANT
GOWN STRL REUS W/TWL XL LVL3 (GOWN DISPOSABLE) ×6 IMPLANT
KIT BASIN OR (CUSTOM PROCEDURE TRAY) ×2 IMPLANT
MARKER SKIN DUAL TIP RULER LAB (MISCELLANEOUS) ×2 IMPLANT
MESH VENTRALIGHT ST 8X10 (Mesh General) ×1 IMPLANT
NDL SPNL 22GX3.5 QUINCKE BK (NEEDLE) IMPLANT
NEEDLE SPNL 22GX3.5 QUINCKE BK (NEEDLE) IMPLANT
PENCIL BUTTON HOLSTER BLD 10FT (ELECTRODE) IMPLANT
SCALPEL HARMONIC ACE (MISCELLANEOUS) IMPLANT
SCISSORS LAP 5X35 DISP (ENDOMECHANICALS) ×2 IMPLANT
SET IRRIG TUBING LAPAROSCOPIC (IRRIGATION / IRRIGATOR) ×1 IMPLANT
SLEEVE XCEL OPT CAN 5 100 (ENDOMECHANICALS) ×4 IMPLANT
SPONGE GAUZE 2X2 STER 10/PKG (GAUZE/BANDAGES/DRESSINGS)
SPONGE GAUZE 4X4 12PLY (GAUZE/BANDAGES/DRESSINGS) ×1 IMPLANT
STRIP CLOSURE SKIN 1/2X4 (GAUZE/BANDAGES/DRESSINGS) ×4 IMPLANT
SUT MNCRL AB 4-0 PS2 18 (SUTURE) ×2 IMPLANT
SUT PDS AB 1 CT1 27 (SUTURE) ×2 IMPLANT
SUT PROLENE 1 CT 1 30 (SUTURE) ×9 IMPLANT
TAPE STRIPS DRAPE STRL (GAUZE/BANDAGES/DRESSINGS) ×1 IMPLANT
TOWEL OR 17X26 10 PK STRL BLUE (TOWEL DISPOSABLE) ×2 IMPLANT
TOWEL OR NON WOVEN STRL DISP B (DISPOSABLE) ×2 IMPLANT
TRAY FOLEY CATH 14FRSI W/METER (CATHETERS) IMPLANT
TRAY LAP CHOLE (CUSTOM PROCEDURE TRAY) ×2 IMPLANT
TROCAR BLADELESS OPT 5 100 (ENDOMECHANICALS) ×2 IMPLANT
TROCAR XCEL NON-BLD 11X100MML (ENDOMECHANICALS) ×1 IMPLANT
TUBING INSUFFLATION 10FT LAP (TUBING) ×2 IMPLANT
TUNNELER SHEATH ON-Q 16GX12 DP (PAIN MANAGEMENT) ×1 IMPLANT

## 2013-06-24 NOTE — H&P (View-Only) (Signed)
Subjective:     Patient ID: Cynthia Hess, female   DOB: 04-16-1955, 58 y.o.   MRN: [id number]  HPI  Note: This dictation was prepared with Dragon/digital dictation along with [facility] technology. Any transcriptional errors that result from this process are unintentional.       Cynthia Hess  1955/05/23 [id number]  Patient Care Team: Cynthia Morton, MD as PCP - General (Family Medicine)  This patient is a 58 y.o.female who presents today for surgical evaluation at the request of Cynthia Hess.   Reason for visit: Periumbilical pain with suspicious of recurrent hernia.  Pleasant obese-looking female.  This had numerous abdominal surgeries.  Back in the 1990s, she had a hysterectomy.  Apparently she had had early reoperation for repair of a bladder.  Then she developed bowel obstruction and required lysis of adhesions.  She developed hernias.  She has required most recently in 2007 Open with mesh by Cynthia Hess and our group who has since retired.    She some developed some abdominal pain and swelling.  She is known to have a hernia by CT scan in 2013, incarcerated with fat.  She sugars with chronic constipation having a bowel movement twice a week at best.  No history of cardiac issues but can only walk 10 minutes before severe back pain makes her stop.  No history of wound infections that she can recall.  No personal nor family history of GI/colon cancer, inflammatory bowel disease, irritable bowel syndrome, allergy such as Celiac Sprue, dietary/dairy problems, colitis, ulcers nor gastritis.  No recent sick contacts/gastroenteritis.  No travel outside the country.  No changes in diet.  No dysphagia to solids or liquids.  No significant heartburn or reflux.  No hematochezia, hematemesis, coffee ground emesis.  No evidence of prior gastric/peptic ulceration.    Patient Active Problem List   Diagnosis Date Noted  . Obesity (BMI 30-39.9) 06/03/2013  . Periumbilical hernia 03/30/2013    . Lumbosacral spondylosis without myelopathy 10/03/2011  . Sciatica 10/03/2011  . DEPRESSION 02/19/2010  . TRANSAMINASES, SERUM, ELEVATED 02/19/2010  . WRIST PAIN, BILATERAL 01/09/2010  . TOBACCO ABUSE 09/04/2008  . CONSTIPATION 09/04/2008  . CHEST PAIN 09/04/2008  . HERNIATED LUMBOSACRAL DISC 05/02/2008  . DEGENERATIVE JOINT DISEASE, LUMBAR SPINE 02/09/2008  . SUBACROMIAL BURSITIS, LEFT 10/30/2007  . DEGENERATIVE DISC DISEASE, CERVICAL SPINE 10/18/2007  . VENTRAL HERNIA 05/21/2007  . MICROSCOPIC HEMATURIA 04/22/2007  . TRICHOMONIASIS 04/22/2007  . PAIN IN SOFT TISSUES OF LIMB 04/15/2007    Past Medical History  Diagnosis Date  . Arthritis     in low back  . Hypertension 2009  . Asthma 2009  . Hyperlipidemia 2013  . HERNIATED LUMBOSACRAL DISC 05/02/2008    Qualifier: Diagnosis of  By: Cynthia Hess, Cynthia Hess    . COSTOCHONDRITIS 09/04/2008    Qualifier: Diagnosis of  By: Cynthia Hess, Cynthia Hess    . NEPHROLITHIASIS 05/21/2007    Annotation: bilateral Qualifier: Diagnosis of  By: Cynthia Hess, Cynthia Hess    . DENTAL CARIES 02/04/2008    Qualifier: Diagnosis of  By: Cynthia Hess, Cynthia Hess    . CLOSED FRACTURE OF METATARSAL BONE 12/29/2008    Annotation: minimally displaced fracture through the fifth metatarsal Qualifier: Diagnosis of  By: Cynthia Hess, Cynthia Hess      Past Surgical History  Procedure Laterality Date  . Hernia repair  2007    reports total of 3 surgeries, most recently 2007  . Abdominal hysterectomy  1989    still gets pap smears  .  Small intestine surgery  1989    for "blockage"  . Bladder surgery    . Tubal ligation  1979  . Tonsillectomy  1967    History   Social History  . Marital Status: Single    Spouse Name: N/A    Number of Children: N/A  . Years of Education: N/A   Occupational History  . Not on file.   Social History Main Topics  . Smoking status: Current Every Day Smoker -- 0.50 packs/day    Types: Cigarettes  . Smokeless tobacco: Never Used     Comment:  cutting back  . Alcohol Use: Yes     Comment: Drinks 2x/month- usually 3-4 beers at a time   . Drug Use: No  . Sexual Activity: Not Currently    Partners: Male    Birth Control/ Protection: Surgical   Other Topics Concern  . Not on file   Social History Narrative   Previous MD was Dr. Philipp Hess, last seen 08/2011; heard about [place] from a friend   Is coming for her back and arm      Has notes from Dr. Wynn Hess (chronic pain mgmt)- non-narcotic txt only per most recent note 09/2011 due to UDS + for codeine, butalbital, ethanol      CHRONIC BACK PAIN PATIENT      Is currently unemployed, finished some high school    On disability for her back- had a fall in 2010       Lives with 70 yo daughter, Cynthia Hess   Uses cigarettes and EtOH, denies drugs      Most recent mammo 2012, pap 2011, CT/MRI 2012 and 2013 for back and hernia       Current meds:   Lisinopril 40 qd   Clonidine 0.1mg  qhs   Tramadol 50mg  1q6 but she is taking it q3   Ibuprofen 800mg  1-2 times per day, but she is taking it every 4 hours    Family History  Problem Relation Age of Onset  . Asthma Brother   . Diabetes Brother   . Hypertension Brother   . Alzheimer's disease Maternal Grandmother   . Early death Mother     when pt was 51; poisoned  . Early death Father     when pt was 66, unknown   . Hypertension Brother     Current Outpatient Prescriptions  Medication Sig Dispense Refill  . acetaminophen (TYLENOL) 650 MG CR tablet Take 650 mg by mouth every 8 (eight) hours.      . hydrochlorothiazide (HYDRODIURIL) 25 MG tablet Take 1 tablet (25 mg total) by mouth daily.  90 tablet  3  . lisinopril (PRINIVIL,ZESTRIL) 40 MG tablet Take 1 tablet (40 mg total) by mouth daily.  30 tablet  3  . meloxicam (MOBIC) 15 MG tablet Take 1 tablet (15 mg total) by mouth daily.  30 tablet  1  . naproxen (NAPROSYN) 375 MG tablet       . omeprazole (PRILOSEC) 40 MG capsule Take 1 capsule (40 mg total) by mouth daily.  90 capsule  3   . traMADol (ULTRAM) 50 MG tablet Take 1-2 tablets (50-100 mg total) by mouth every 6 (six) hours as needed.  160 tablet  0   No current facility-administered medications for this visit.     Allergies  Allergen Reactions  . Latex Itching and Swelling    BP 116/80  Pulse 75  Temp(Src) 98.2 F (36.8 C) (Oral)  Ht 5\' 2"  (1.575 m)  Wt 167 lb (75.751 kg)  BMI 30.54 kg/m2  No results found.   Review of Systems  Constitutional: Negative for fever, chills, diaphoresis, appetite change and fatigue.  HENT: Negative for ear discharge, ear pain, sore throat and trouble swallowing.   Eyes: Negative for photophobia, discharge and visual disturbance.  Respiratory: Negative for cough, choking, chest tightness and shortness of breath.   Cardiovascular: Negative for chest pain and palpitations.  Gastrointestinal: Negative for nausea, vomiting, abdominal pain, diarrhea, constipation, anal bleeding and rectal pain.  Endocrine: Negative for cold intolerance and heat intolerance.  Genitourinary: Negative for dysuria, frequency and difficulty urinating.  Musculoskeletal: Positive for arthralgias, back pain and myalgias. Negative for gait problem and neck pain.  Skin: Negative for color change, pallor and rash.  Allergic/Immunologic: Negative for environmental allergies, food allergies and immunocompromised state.  Neurological: Negative for dizziness, speech difficulty, weakness and numbness.  Hematological: Negative for adenopathy. Bruises/bleeds easily.  Psychiatric/Behavioral: Negative for confusion and agitation. The patient is not nervous/anxious.        Objective:   Physical Exam  Constitutional: She is oriented to person, place, and time. She appears well-developed and well-nourished. No distress.  HENT:  Head: Normocephalic.  Mouth/Throat: Oropharynx is clear and moist. No oropharyngeal exudate.  Eyes: Conjunctivae and EOM are normal. Pupils are equal, round, and reactive to light. No  scleral icterus.  Neck: Normal range of motion. Neck supple. No tracheal deviation present.  Cardiovascular: Normal rate, regular rhythm and intact distal pulses.   Pulmonary/Chest: Effort normal and breath sounds normal. No stridor. No respiratory distress. She exhibits no tenderness.  Abdominal: Soft. She exhibits no distension and no mass. There is tenderness in the periumbilical area. There is no rigidity, no rebound, no guarding, no tenderness at McBurney's point and negative Murphy's sign. A hernia is present. Hernia confirmed positive in the ventral area. Hernia confirmed negative in the right inguinal area and confirmed negative in the left inguinal area.    Genitourinary: No vaginal discharge found.  Musculoskeletal: Normal range of motion. She exhibits no tenderness.       Right elbow: She exhibits normal range of motion.       Left elbow: She exhibits normal range of motion.       Right wrist: She exhibits normal range of motion.       Left wrist: She exhibits normal range of motion.       Right hand: Normal strength noted.       Left hand: Normal strength noted.  Lymphadenopathy:       Head (right side): No posterior auricular adenopathy present.       Head (left side): No posterior auricular adenopathy present.    She has no cervical adenopathy.    She has no axillary adenopathy.       Right: No inguinal adenopathy present.       Left: No inguinal adenopathy present.  Neurological: She is alert and oriented to person, place, and time. No cranial nerve deficit. She exhibits normal muscle tone. Coordination normal.  Skin: Skin is warm and dry. No rash noted. She is not diaphoretic. No erythema.  Psychiatric: She has a normal mood and affect. Her behavior is normal. Judgment and thought content normal.       Assessment:     Supraumbilical and periumbilical cursory ventral hernias and wound with numerous prior abdominal surgeries.     Plan:     I think she would benefit  from laparoscopic possible open exploration  and repair of hernias.  Her risks of bowel injury and other issues are higher  With her prior abdominal surgeries and her repairs.  I would use mesh this time since he will not be emergent situation hopefully we can avoid a recurrence despite her obesity and tobacco abuse.  I strongly recommended she quit smoking.  She is interested in surgery:  The anatomy & physiology of the abdominal wall was discussed.  The pathophysiology of hernias was discussed.  Natural history risks without surgery including progeressive enlargement, pain, incarceration & strangulation was discussed.   Contributors to complications such as smoking, obesity, diabetes, prior surgery, etc were discussed.   I feel the risks of no intervention will lead to serious problems that outweigh the operative risks; therefore, I recommended surgery to reduce and repair the hernia.  I explained laparoscopic techniques with possible need for an open approach.  I noted the probable use of mesh to patch and/or buttress the hernia repair  Risks such as bleeding, infection, abscess, need for further treatment, heart attack, death, and other risks were discussed.  I noted a good likelihood this will help address the problem.   Goals of post-operative recovery were discussed as well.  Possibility that this will not correct all symptoms was explained.  I stressed the importance of low-impact activity, aggressive pain control, avoiding constipation, & not pushing through pain to minimize risk of post-operative chronic pain or injury. Possibility of reherniation especially with smoking, obesity, diabetes, immunosuppression, and other health conditions was discussed.  We will work to minimize complications.     An educational handout further explaining the pathology & treatment options was given as well.  Questions were answered.  The patient expresses understanding & wishes to proceed with surgery.  STOP  SMOKING! We talked to the patient about the dangers of smoking.  We stressed that tobacco use dramatically increases the risk of peri-operative complications such as infection, tissue necrosis leaving to problems with incision/wound and organ healing, hernia, chronic pain, heart attack, stroke, DVT, pulmonary embolism, and death.  We noted there are programs in our community to help stop smoking.  Information was available.

## 2013-06-24 NOTE — Interval H&P Note (Signed)
History and Physical Interval Note:  06/24/2013 11:51 AM  Cynthia Hess  has presented today for surgery, with the diagnosis of recurrent incisional abdominal wall hernias  The various methods of treatment have been discussed with the patient and family. After consideration of risks, benefits and other options for treatment, the patient has consented to  Procedure(s): LAPAROSCOPIC VENTRAL WALL HERNIA REPAIR (N/A) INSERTION OF MESH (N/A) as a surgical intervention .  The patient's history has been reviewed, patient examined, no change in status, stable for surgery.  I have reviewed the patient's chart and labs.  Questions were answered to the patient's satisfaction.     Jiovanny Burdell C.

## 2013-06-24 NOTE — Anesthesia Preprocedure Evaluation (Signed)
Anesthesia Evaluation  Patient identified by MRN, date of birth, ID band Patient awake    Reviewed: Allergy & Precautions, H&P , NPO status , Patient's Chart, lab work & pertinent test results  Airway Mallampati: II TM Distance: <3 FB Neck ROM: Full    Dental no notable dental hx.    Pulmonary Current Smoker,  breath sounds clear to auscultation  Pulmonary exam normal       Cardiovascular hypertension, Pt. on medications Rhythm:Regular Rate:Normal     Neuro/Psych negative neurological ROS  negative psych ROS   GI/Hepatic negative GI ROS, Neg liver ROS,   Endo/Other  negative endocrine ROS  Renal/GU negative Renal ROS  negative genitourinary   Musculoskeletal negative musculoskeletal ROS (+)   Abdominal   Peds negative pediatric ROS (+)  Hematology negative hematology ROS (+)   Anesthesia Other Findings   Reproductive/Obstetrics negative OB ROS                           Anesthesia Physical Anesthesia Plan  ASA: II  Anesthesia Plan: General   Post-op Pain Management:    Induction: Intravenous  Airway Management Planned: Oral ETT  Additional Equipment:   Intra-op Plan:   Post-operative Plan: Extubation in OR  Informed Consent: I have reviewed the patients History and Physical, chart, labs and discussed the procedure including the risks, benefits and alternatives for the proposed anesthesia with the patient or authorized representative who has indicated his/her understanding and acceptance.   Dental advisory given  Plan Discussed with: CRNA and Surgeon  Anesthesia Plan Comments:         Anesthesia Quick Evaluation  

## 2013-06-24 NOTE — Progress Notes (Signed)
Patient is arousable and vital signs are stable. Patient is oriented to place, time and situation. Patient still complains of nausea. Patient has been able to void but no bowel movement. Patient still needs to be monitored. Venita LickEsther Marjean Imperato (Student Nurse) 06/24/2013 1900

## 2013-06-24 NOTE — Op Note (Signed)
06/24/2013  3:10 PM  PATIENT:  Cynthia Hess  58 y.o. female  Patient Care Team: Bobbye Morton, MD as PCP - General (Family Medicine)  PRE-OPERATIVE DIAGNOSIS:  recurrent incisional abdominal wall hernias  POST-OPERATIVE DIAGNOSIS:  recurrent incisional abdominal wall hernias  PROCEDURE:  Procedure(s): LAPAROSCOPIC LYSIS OF ADHESIONS LAPAROSCOPIC INCARCERATED INCISIONAL VENTRAL WALL HERNIAS INSERTION OF MESH  SURGEON:  Surgeon(s): Ardeth Sportsman, MD  ASSISTANT: rn   ANESTHESIA:   local and general  EBL:  Total I/O In: 1000 [I.V.:1000] Out: 300 [Urine:300]  Delay start of Pharmacological VTE agent (>24hrs) due to surgical blood loss or risk of bleeding:  no  DRAINS: none   SPECIMEN:  Source of Specimen:  INCARCERATED OMENTUM (NOT SENT)  DISPOSITION OF SPECIMEN:  N/A  COUNTS:  YES  PLAN OF CARE: Discharge to home after PACU  PATIENT DISPOSITION:  PACU - hemodynamically stable.  INDICATION: Pleasant patient has developed a ventral wall abdominal hernia.   Recommendation was made for surgical repair:  The anatomy & physiology of the abdominal wall was discussed. The pathophysiology of hernias was discussed. Natural history risks without surgery including progeressive enlargement, pain, incarceration & strangulation was discussed. Contributors to complications such as smoking, obesity, diabetes, prior surgery, etc were discussed.  I feel the risks of no intervention will lead to serious problems that outweigh the operative risks; therefore, I recommended surgery to reduce and repair the hernia. I explained laparoscopic techniques with possible need for an open approach. I noted the probable use of mesh to patch and/or buttress the hernia repair  Risks such as bleeding, infection, abscess, need for further treatment, heart attack, death, and other risks were discussed. I noted a good likelihood this will help address the problem. Goals of post-operative recovery were  discussed as well. Possibility that this will not correct all symptoms was explained. I stressed the importance of low-impact activity, aggressive pain control, avoiding constipation, & not pushing through pain to minimize risk of post-operative chronic pain or injury. Possibility of reherniation especially with smoking, obesity, diabetes, immunosuppression, and other health conditions was discussed. We will work to minimize complications.  An educational handout further explaining the pathology & treatment options was given as well. Questions were answered. The patient expresses understanding & wishes to proceed with surgery.   OR FINDINGS: Numerous Swiss cheese type hernias from subxiphoid to the umbilicus.  A 12 x 5 cm region total.  Largest defect 3 x 3 cm.  All incarcerated with greater omentum.  Colon nearby and a few epiploic appendages within but no true colon incarcerated within.   Type of repair - Laparoscopic underlay repair   Name of mesh - Bard Ventralight dual sided (polypropylene / Seprafilm)  Size of mesh - Length 25 cm, Width 20 cm  Mesh overlap - 5-7 cm  Placement of mesh - Intraperitoneal underlay repair   DESCRIPTION:   Informed consent was confirmed. The patient underwent general anaesthesia without difficulty. The patient was positioned appropriately. VTE prevention in place. The patient's abdomen was clipped, prepped, & draped in a sterile fashion. Surgical timeout confirmed our plan.  The patient was positioned in reverse Trendelenburg. Abdominal entry was gained using optical entry technique in the left upper abdomen. Entry was clean. I induced carbon dioxide insufflation. Camera inspection revealed no injury. Extra ports were carefully placed under direct laparoscopic visualization.   The patient had moderate greater omental adhesions through the entire midline and especially superiorly.  I carefully freed those off with some  focus blunt dissection, cold scissors, and  focused cautery scissors.  Numerous Swiss cheese hernia defects anywhere from 5-30 mm in size.  Incarcerated omentum that was able to be reduced.  Largest one was the subxiphoid one that was obviously symptomatic to her.  I was able to reduce all those down.  With that I could see the hernias in the  abdomen.  I did laparoscopic lysis of adhesions to Remove pre-peritoneal fat and the falciform ligament off the anterior abdominal wall up to the xiphoid.  Old mesh could be seen in the supraumbilical region.  Recurrent hernia cephalad to that.  That was the largest one to.    I made sure hemostasis was good.    I mapped out the region using a needle passer.   To ensure that I would have at least 5 cm radial coverage outside of the hernia defect, I chose a 25x20 cm dual sided mesh.  I placed #1 Prolene stitches around its edge about every 5 cm = 14 total.  I made an incision at the umbilicus.  I rolled the mesh & placed into the peritoneal cavity through a periumbilical 2cm hernia defect.  I unrolled  the mesh and positioned it appropriately.  I secured the mesh to cover up the hernia defect using a laparoscopic suture passer to pass the tails of the Prolene through the abdominal wall & tagged them with clamps.  I started out in four corners to make sure I had the mesh centered over the hernia defect appropriately, and then proceeded to work in quadrants.  We evacuated CO2 & desufflated the abdomen.  I tied the fascial stitches down.  I closed primarily the periumbilical hernia using #1 PDS transversely in interrupted fashion. I reinsufflated the abdomen.  The mesh provided at least 5-10 cm circumferential coverage around the entire region of hernia defects.   I tacked the edges & central part of the mesh to the peritoneum/posterior rectus fascia with SecureStrap absorbable tacks.   Hemostasis was excellent.   I then placed On-Q catheter sheaths in the preperitoneal plane under direct laparoscopic guidance from  the subxiphoid region down to the posterior iliac crests in the lower flanks. I did reinspection. Hemostasis was good. Mesh laid well. Capnoperitoneum was evacuated. Ports were removed. The skin was closed with Monocryl at the port sites and Steri-Strips on the fascial stitch puncture sites. OnQ catheters were placed and the sheathes peeled away. On-Q pump was secured. Patient is being extubated to go to the recovery room. I'm about discussed operative findings with the patient's family.

## 2013-06-24 NOTE — Discharge Instructions (Signed)
Patient Education Sheet  °ON-Q Pain Relief System  ° °What is the ON-Q?  °The ON-Q is a balloon-type pump filled with a medicine to treat your pain. It blocks the pain in the area of your procedure. With ON-Q, you may have better pain relief and need less pain medicine. Its small size allows you to move around freely.  °How does the ON-Q work?  °The pump is attached to a catheter (small tube) near your procedure site. The pump automatically pushes the medicine through the tube. DO NOT squeeze the pump. The pump may be clipped to your clothing or dressing or placed in a small carrying case.  ° °How do I know the pump is working?  °The pump gives your medicine very slowly. It may take longer than 24 hours after your procedure to notice a change in the size and look of the pump. Your pump may look like the picture.  °In time, the outside bag on the pump will get looser and wrinkles will form in the bag.  °Talk to your doctor about taking other pain medication as needed.  °Check the following:  °o Make sure the white clamp on the tubing is open (moves freely on the tubing).  °o Make sure there are no kinks in the pump tubing.  °o Do not tape or cover the filter.  ° °What should I do with my ON-Q when sleeping?  °Make sure the pump is placed on a bedside table or on top of bed covers.  °Do not place the pump underneath the bed covers where the pump may become too warm.  °Do not place the pump on the floor or hang the pump on a bedpost.  ° °Can I take a shower with ON-Q?  °Your doctor will tell you when it’s ok for you to shower.  °Take care to protect the catheter site, pump and filter from water.  °Do not submerge the pump in water.  ° °How long will my ON-Q last?  °Depending on the size of your pump, it may take 4-5 days to give you all the medicine. All your medicine has been delivered when the outside bag is flat and a hard tube can be felt in the middle of the pump.  ° °Troubleshooting  °Tubing Disconnection  °If  the pump tubing becomes disconnected from your catheter, Remove all dressings and catheters and throw the On-Q pump away ° °Leaking  °If leaking from the pump or pump tubing occurs, then remove all dressings and catheters and throw the On-Q pump away °  °When should I call the surgeon?  °Be aware that you may experience loss of feeling (numbness) at and around the area of your procedure. If this occurs, be careful not to hurt yourself. Be careful when placing hot or cold items on the numb area.  °The following symptoms may represent a serious medical condition.  °Immediately close the clamp on the pump tubing and call your doctor or 911 in case of emergency.  °Increase in pain  °Fever, chills, sweats  °Bowel or bladder changes  °Difficulty breathing  °Redness, warmth, or discharge or excessive bleeding from the catheter site  °Dizziness or lightheadedness  °Blurred vision  °Ringing or buzzing in your ears  °Metal taste in your mouth  °Numbness or tingling around your mouth, fingers, or toes  °Drowsiness  °Confusion  ° °How to remove the ON-Q catheter?  °If your doctor has instructed you to remove the ON-Q catheter, follow   their instructions keeping in mind these steps:  °-Remove the dressing covering the catheter site.  °-Remove any skin adhesive strips.  °-Grasp the catheter close to the skin and gently pull on the catheter. °-It should be easy to remove and not painful.  °-If it becomes difficult STOP and call your doctor.  °-Do not cut or pull hard to remove the catheter.  °-Once removed, check the catheter tip for a black marking to ensure the entire catheter was removed. Call your doctor if you do not see the black marking.  °Place bandage over the catheter site as instructed by your doctor.  ° °Source: [employer], ON-Q, and Select-[employer] are registered trademarks and [employer] and [employer] are trademarks of [employer]. © 2011 [employer]. All right reserved.  °10/ ° °HERNIA REPAIR: POST OP  INSTRUCTIONS ° °1. DIET: Follow a light bland diet the first 24 hours after arrival home, such as soup, liquids, crackers, etc.  Be sure to include lots of fluids daily.  Avoid fast food or heavy meals as your are more likely to get nauseated.  Eat a low fat the next few days after surgery. °2. Take your usually prescribed home medications unless otherwise directed. °3. PAIN CONTROL: °a. Pain is best controlled by a usual combination of three different methods TOGETHER: °i. Ice/Heat °ii. Over the counter pain medication °iii. Prescription pain medication °b. Most patients will experience some swelling and bruising around the hernia(s) such as the bellybutton, groins, or old incisions.  Ice packs or heating pads (30-60 minutes up to 6 times a day) will help. Use ice for the first few days to help decrease swelling and bruising, then switch to heat to help relax tight/sore spots and speed recovery.  Some people prefer to use ice alone, heat alone, alternating between ice & heat.  Experiment to what works for you.  Swelling and bruising can take several weeks to resolve.   °c. It is helpful to take an over-the-counter pain medication regularly for the first few weeks.  Choose one of the following that works best for you: °i. Naproxen (Aleve, etc)  Two 220mg tabs twice a day °ii. Ibuprofen (Advil, etc) Three 200mg tabs four times a day (every meal & bedtime) °iii. Acetaminophen (Tylenol, etc) 325-650mg four times a day (every meal & bedtime) °d. A  prescription for pain medication should be given to you upon discharge.  Take your pain medication as prescribed.  °i. If you are having problems/concerns with the prescription medicine (does not control pain, nausea, vomiting, rash, itching, etc), please call us [phone] to see if we need to switch you to a different pain medicine that will work better for you and/or control your side effect better. °ii. If you need a refill on your pain medication, please contact your  pharmacy.  They will contact our office to request authorization. Prescriptions will not be filled after 5 pm or on week-ends. °4. Avoid getting constipated.  Between the surgery and the pain medications, it is common to experience some constipation.  Increasing fluid intake and taking a fiber supplement (such as Metamucil, Citrucel, FiberCon, MiraLax, etc) 1-2 times a day regularly will usually help prevent this problem from occurring.  A mild laxative (prune juice, Milk of Magnesia, MiraLax, etc) should be taken according to package directions if there are no bowel movements after 48 hours.   °5. Wash / shower every day.  You may shower over the dressings as they are waterproof.   °  6. Remove your waterproof bandages 5 days after surgery.  You may leave the incision open to air.  You may replace a dressing/Band-Aid to cover the incision for comfort if you wish.  Continue to shower over incision(s) after the dressing is off. ° ° ° °7. ACTIVITIES as tolerated:   °a. You may resume regular (light) daily activities beginning the next day--such as daily self-care, walking, climbing stairs--gradually increasing activities as tolerated.  If you can walk 30 minutes without difficulty, it is safe to try more intense activity such as jogging, treadmill, bicycling, low-impact aerobics, swimming, etc. °b. Save the most intensive and strenuous activity for last such as sit-ups, heavy lifting, contact sports, etc  Refrain from any heavy lifting or straining until you are off narcotics for pain control.   °c. DO NOT PUSH THROUGH PAIN.  Let pain be your guide: If it hurts to do something, don't do it.  Pain is your body warning you to avoid that activity for another week until the pain goes down. °d. You may drive when you are no longer taking prescription pain medication, you can comfortably wear a seatbelt, and you can safely maneuver your car and apply brakes. °e. You may have sexual intercourse when it is comfortable.   °8. FOLLOW UP in our office °a. Please call [facility] at [phone] to set up an appointment to see your surgeon in the office for a follow-up appointment approximately 2-3 weeks after your surgery. °b. Make sure that you call for this appointment the day you arrive home to insure a convenient appointment time. °9.  IF YOU HAVE DISABILITY OR FAMILY LEAVE FORMS, BRING THEM TO THE OFFICE FOR PROCESSING.  DO NOT GIVE THEM TO YOUR DOCTOR. ° °WHEN TO CALL US [phone]: °1. Poor pain control °2. Reactions / problems with new medications (rash/itching, nausea, etc)  °3. Fever over 101.5 F (38.5 C) °4. Inability to urinate °5. Nausea and/or vomiting °6. Worsening swelling or bruising °7. Continued bleeding from incision. °8. Increased pain, redness, or drainage from the incision ° ° The clinic staff is available to answer your questions during regular business hours (8:30am-5pm).  Please don’t hesitate to call and ask to speak to one of our nurses for clinical concerns.  ° If you have a medical emergency, go to the nearest emergency room or call 911. ° A surgeon from [facility] is always on call at the hospitals in [place] ° °[facility] °[address], [address], [place], [place]  [address] ? ° [address] [address], ,    [address] °MAIN: [phone] ? TOLL FREE: [phone] ? FAX: [phone] °[email or web address] ° °Managing Pain ° °Pain after surgery or related to activity is often due to strain/injury to muscle, tendon, nerves and/or incisions.  This pain is usually short-term and will improve in a few months.  ° °Many people find it helpful to do the following things TOGETHER to help speed the process of healing and to get back to regular activity more quickly: ° °1. Avoid heavy physical activity °a.  no lifting greater than 20 pounds °b. Do not “push through” the pain.  Listen to your body and avoid positions and maneuvers than reproduce the  pain °c. Walking is okay as tolerated, but go slowly and stop when getting sore.  °d. Remember: If it hurts to do it, then don’t do it! °2. Take Anti-inflammatory medication  °a. Take with food/snack around the clock for 1-2 weeks °  i. This helps the muscle and nerve tissues become less irritable and calm down faster °b. Choose ONE of the following over-the-counter medications: °i. Naproxen 220mg tabs (ex. Aleve) 1-2 pills twice a day  °ii. Ibuprofen 200mg tabs (ex. Advil, Motrin) 3-4 pills with every meal and just before bedtime °iii. Acetaminophen 500mg tabs (Tylenol) 1-2 pills with every meal and just before bedtime °3. Use a Heating pad or Ice/Cold Pack °a. 4-6 times a day °b. May use warm bath/hottub  or showers °4. Try Gentle Massage and/or Stretching  °a. at the area of pain many times a day °b. stop if you feel pain - do not overdo it ° °Try these steps together to help you body heal faster and avoid making things get worse.  Doing just one of these things may not be enough.   ° °If you are not getting better after two weeks or are noticing you are getting worse, contact our office for further advice; we may need to re-evaluate you & see what other things we can do to help. ° °GETTING TO GOOD BOWEL HEALTH. °Irregular bowel habits such as constipation and diarrhea can lead to many problems over time.  Having one soft bowel movement a day is the most important way to prevent further problems.  The anorectal canal is designed to handle stretching and feces to safely manage our ability to get rid of solid waste (feces, poop, stool) out of our body.  BUT, hard constipated stools can act like ripping concrete bricks and diarrhea can be a burning fire to this very sensitive area of our body, causing inflamed hemorrhoids, anal fissures, increasing risk is perirectal abscesses, abdominal pain/bloating, an making irritable bowel worse.     °The goal: ONE SOFT BOWEL MOVEMENT A DAY!  To have soft, regular bowel  movements:  °  Drink at least 8 tall glasses of water a day.   °  Take plenty of fiber.  Fiber is the undigested part of plant food that passes into the colon, acting s “natures broom” to encourage bowel motility and movement.  Fiber can absorb and hold large amounts of water. This results in a larger, bulkier stool, which is soft and easier to pass. Work gradually over several weeks up to 6 servings a day of fiber (25g a day even more if needed) in the form of: °o Vegetables -- Root (potatoes, carrots, turnips), leafy green (lettuce, salad greens, celery, spinach), or cooked high residue (cabbage, broccoli, etc) °o Fruit -- Fresh (unpeeled skin & pulp), Dried (prunes, apricots, cherries, etc ),  or stewed ( applesauce)  °o Whole grain breads, pasta, etc (whole wheat)  °o Bran cereals  °  Bulking Agents -- This type of water-retaining fiber generally is easily obtained each day by one of the following:  °o Psyllium bran -- The psyllium plant is remarkable because its ground seeds can retain so much water. This product is available as Metamucil, Konsyl, Effersyllium, Per Diem Fiber, or the less expensive generic preparation in drug and health food stores. Although labeled a laxative, it really is not a laxative.  °o Methylcellulose -- This is another fiber derived from wood which also retains water. It is available as Citrucel. °o Polyethylene Glycol - and “artificial” fiber commonly called Miralax or Glycolax.  It is helpful for people with gassy or bloated feelings with regular fiber °o Flax Seed - a less gassy fiber than psyllium °  No reading or other relaxing activity while on the   toilet. If bowel movements take longer than 5 minutes, you are too constipated °  AVOID CONSTIPATION.  High fiber and water intake usually takes care of this.  Sometimes a laxative is needed to stimulate more frequent bowel movements, but  °  Laxatives are not a good long-term solution as it can wear the colon out. °o Osmotics (Milk of  Magnesia, Fleets phosphosoda, Magnesium citrate, MiraLax, GoLytely) are safer than  °o Stimulants (Senokot, Castor Oil, Dulcolax, Ex Lax)    °o Do not take laxatives for more than 7days in a row. °   IF SEVERELY CONSTIPATED, try a Bowel Retraining Program: °o Do not use laxatives.  °o Eat a diet high in roughage, such as bran cereals and leafy vegetables.  °o Drink six (6) ounces of prune or apricot juice each morning.  °o Eat two (2) large servings of stewed fruit each day.  °o Take one (1) heaping tablespoon of a psyllium-based bulking agent twice a day. Use sugar-free sweetener when possible to avoid excessive calories.  °o Eat a normal breakfast.  °o Set aside 15 minutes after breakfast to sit on the toilet, but do not strain to have a bowel movement.  °o If you do not have a bowel movement by the third day, use an enema and repeat the above steps.  °  Controlling diarrhea °o Switch to liquids and simpler foods for a few days to avoid stressing your intestines further. °o Avoid dairy products (especially milk & ice cream) for a short time.  The intestines often can lose the ability to digest lactose when stressed. °o Avoid foods that cause gassiness or bloating.  Typical foods include beans and other legumes, cabbage, broccoli, and dairy foods.  Every person has some sensitivity to other foods, so listen to our body and avoid those foods that trigger problems for you. °o Adding fiber (Citrucel, Metamucil, psyllium, Miralax) gradually can help thicken stools by absorbing excess fluid and retrain the intestines to act more normally.  Slowly increase the dose over a few weeks.  Too much fiber too soon can backfire and cause cramping & bloating. °o Probiotics (such as active yogurt, Align, etc) may help repopulate the intestines and colon with normal bacteria and calm down a sensitive digestive tract.  Most studies show it to be of mild help, though, and such products can be costly. °o Medicines: °  Bismuth  subsalicylate (ex. Kayopectate, Pepto Bismol) every 30 minutes for up to 6 doses can help control diarrhea.  Avoid if pregnant. °  Loperamide (Immodium) can slow down diarrhea.  Start with two tablets (4mg total) first and then try one tablet every 6 hours.  Avoid if you are having fevers or severe pain.  If you are not better or start feeling worse, stop all medicines and call your doctor for advice °o Call your doctor if you are getting worse or not better.  Sometimes further testing (cultures, endoscopy, X-ray studies, bloodwork, etc) may be needed to help diagnose and treat the cause of the diarrhea. °o  °

## 2013-06-24 NOTE — Anesthesia Postprocedure Evaluation (Signed)
  Anesthesia Post-op Note  Patient: Cynthia MoseRhonda Y [place]  Procedure(s) Performed: Procedure(s) (LRB): LAPAROSCOPIC VENTRAL WALL HERNIA REPAIR (N/A) INSERTION OF MESH (N/A)  Patient Location: PACU  Anesthesia Type: General  Level of Consciousness: awake and alert   Airway and Oxygen Therapy: Patient Spontanous Breathing  Post-op Pain: mild  Post-op Assessment: Post-op Vital signs reviewed, Patient's Cardiovascular Status Stable, Respiratory Function Stable, Patent Airway and No signs of Nausea or vomiting  Last Vitals:  Filed Vitals:   06/24/13 1605  BP:   Pulse: 65  Temp:   Resp: 22    Post-op Vital Signs: stable   Complications: No apparent anesthesia complications

## 2013-06-24 NOTE — Transfer of Care (Signed)
Immediate Anesthesia Transfer of Care Note  Patient: Cynthia MoseRhonda Y [place]  Procedure(s) Performed: Procedure(s) (LRB): LAPAROSCOPIC VENTRAL WALL HERNIA REPAIR (N/A) INSERTION OF MESH (N/A)  Patient Location: PACU  Anesthesia Type: General  Level of Consciousness: sedated, patient cooperative and responds to stimulation  Airway & Oxygen Therapy: Patient Spontanous Breathing and Patient connected to face mask oxgen  Post-op Assessment: Report given to PACU RN and Post -op Vital signs reviewed and stable  Post vital signs: Reviewed and stable  Complications: No apparent anesthesia complications

## 2013-06-25 NOTE — Progress Notes (Signed)
Pt has not passed flatus yet and was nauseas post lunch. Po pain meds appear adequate for pain relief. Pt up walkin g in halls frequently. Will continue to monitor for possible discharge.

## 2013-06-25 NOTE — Discharge Summary (Signed)
Physician Discharge Summary  Patient ID: Cynthia MoseRhonda Y Hess MRN: [id number] DOB/AGE: 04-Jan-1956 58 y.o.  Admit date: 06/24/2013 Discharge date: 06/25/2013  Admission Diagnoses:  Discharge Diagnoses:  Active Problems:   Recurrent incisional hernia with incarceration s/p lap repair w mesh 06/24/2013   S/P hernia repair   Discharged Condition: good  Hospital Course: Pt with recurrent VWH incisional. Postoperatively, the patient mobilized in the hallways and advanced to a solid diet gradually.  Pain was well-controlled and transitioned off IV medications.    By the time of discharge, the patient was walking well the hallways, eating food well, having flatus.  Pain was-controlled on an oral regimen.  Based on meeting DC criteria and recovering well, I felt it was safe for the patient to be discharged home with close followup.  She wished to go home as well.  Instructions were discussed in detail.  They are written as well.  Consults: None  Significant Diagnostic Studies:   No results found for this or any previous visit (from the past 72 hour(s)).  Treatments:   POST-OPERATIVE DIAGNOSIS: recurrent incisional abdominal wall hernias   PROCEDURE: Procedure(s):  LAPAROSCOPIC LYSIS OF ADHESIONS  LAPAROSCOPIC INCARCERATED INCISIONAL VENTRAL WALL HERNIAS  INSERTION OF MESH   SURGEON:  Ardeth SportsmanSteven C. Javayah Magaw, MD     Discharge Exam: Blood pressure 116/74, pulse 87, temperature 99.3 F (37.4 C), temperature source Oral, resp. rate 18, height 5\' 2"  (1.575 m), weight 164 lb 4.6 oz (74.521 kg), SpO2 92.00%.  General: Pt awake/alert/oriented x4 in no major acute distress Eyes: PERRL, normal EOM. Sclera nonicteric Neuro: CN II-XII intact w/o focal sensory/motor deficits. Lymph: No head/neck/groin lymphadenopathy Psych:  No delerium/psychosis/paranoia HENT: Normocephalic, Mucus membranes moist.  No thrush Neck: Supple, No tracheal deviation Chest: No pain.  Good respiratory excursion. CV:  Pulses  intact.  Regular rhythm MS: Normal AROM mjr joints.  No obvious deformity Abdomen: Soft, Nondistended.  Mild TTP at incisions.  Binder in place.  No incarcerated hernias. Ext:  SCDs BLE.  No significant edema.  No cyanosis Skin: No petechiae / purpura   Disposition: 01-Home or Self Care  Discharge Orders   Future Appointments Provider Department Dept Phone   07/13/2013 10:00 AM Ardeth SportsmanSteven C. Marieli Rudy, [facility] [place] [phone]   Future Orders Complete By Expires   Call MD for:  extreme fatigue  As directed    Call MD for:  extreme fatigue  As directed    Call MD for:  hives  As directed    Call MD for:  hives  As directed    Call MD for:  persistant nausea and vomiting  As directed    Call MD for:  persistant nausea and vomiting  As directed    Call MD for:  redness, tenderness, or signs of infection (pain, swelling, redness, odor or green/yellow discharge around incision site)  As directed    Call MD for:  redness, tenderness, or signs of infection (pain, swelling, redness, odor or green/yellow discharge around incision site)  As directed    Call MD for:  severe uncontrolled pain  As directed    Call MD for:  severe uncontrolled pain  As directed    Call MD for:  As directed    Comments:     Temperature > 101.31F   Call MD for:  As directed    Comments:     Temperature > 101.31F   Diet - low sodium heart healthy  As directed    Diet -  low sodium heart healthy  As directed    Discharge instructions  As directed    Comments:     Please see discharge instruction sheets.  Also refer to handout given an office.  Please call our office if you have any questions or concerns [phone]   Discharge instructions  As directed    Comments:     Please see discharge instruction sheets.  Also refer to handout given an office.  Please call our office if you have any questions or concerns [phone]   Discharge wound care:  As directed    Comments:     If you have closed  incisions, shower and bathe over these incisions with soap and water every day.  Remove all surgical dressings on postoperative day #3.  You do not need to replace dressings over the closed incisions unless you feel more comfortable with a Band-Aid covering it.   If you have an open wound that requires packing, please see wound care instructions.  In general, remove all dressings, wash wound with soap and water and then replace with saline moistened gauze.  Do the dressing change at least every day.  Please call our office [phone] if you have further questions.   Discharge wound care:  As directed    Comments:     If you have closed incisions, shower and bathe over these incisions with soap and water every day.  Remove all surgical dressings on postoperative day #3.  You do not need to replace dressings over the closed incisions unless you feel more comfortable with a Band-Aid covering it.   If you have an open wound that requires packing, please see wound care instructions.  In general, remove all dressings, wash wound with soap and water and then replace with saline moistened gauze.  Do the dressing change at least every day.  Please call our office [phone] if you have further questions.   Driving Restrictions  As directed    Comments:     No driving until off narcotics and can safely swerve away without pain during an emergency   Driving Restrictions  As directed    Comments:     No driving until off narcotics and can safely swerve away without pain during an emergency   Increase activity slowly  As directed    Comments:     Walk an hour a day.  Use 20-30 minute walks.  When you can walk 30 minutes without difficulty, increase to low impact/moderate activities such as biking, jogging, swimming, sexual activity..  Eventually can increase to unrestricted activity when not feeling pain.  If you feel pain: STOP!Marland Kitchen   Let pain protect you from overdoing it.  Use ice/heat/over-the-counter pain  medications to help minimize his soreness.  Use pain prescriptions as needed to remain active.  It is better to take extra pain medications and be more active than to stay bedridden to avoid all pain medications.   Increase activity slowly  As directed    Comments:     Walk an hour a day.  Use 20-30 minute walks.  When you can walk 30 minutes without difficulty, increase to low impact/moderate activities such as biking, jogging, swimming, sexual activity..  Eventually can increase to unrestricted activity when not feeling pain.  If you feel pain: STOP!Marland Kitchen   Let pain protect you from overdoing it.  Use ice/heat/over-the-counter pain medications to help minimize his soreness.  Use pain prescriptions as needed to remain active.  It  is better to take extra pain medications and be more active than to stay bedridden to avoid all pain medications.   Lifting restrictions  As directed    Comments:     Avoid heavy lifting initially.  Do not push through pain.  You have no specific weight limit.  Coughing and sneezing or four more stressful to your incision than any lifting you will do. Pain will protect you from injury.  Therefore, avoid intense activity until off all narcotic pain medications.  Coughing and sneezing or four more stressful to your incision than any lifting he will do.   Lifting restrictions  As directed    Comments:     Avoid heavy lifting initially.  Do not push through pain.  You have no specific weight limit.  Coughing and sneezing or four more stressful to your incision than any lifting you will do. Pain will protect you from injury.  Therefore, avoid intense activity until off all narcotic pain medications.  Coughing and sneezing or four more stressful to your incision than any lifting he will do.   May shower / Bathe  As directed    May shower / Bathe  As directed    May walk up steps  As directed    May walk up steps  As directed    Sexual Activity Restrictions  As directed    Comments:      Sexual activity as tolerated.  Do not push through pain.  Pain will protect you from injury.   Sexual Activity Restrictions  As directed    Comments:     Sexual activity as tolerated.  Do not push through pain.  Pain will protect you from injury.   Walk with assistance  As directed    Comments:     Walk over an hour a day.  May use a walker/cane/companion to help with balance and stamina.   Walk with assistance  As directed    Comments:     Walk over an hour a day.  May use a walker/cane/companion to help with balance and stamina.       Medication List    STOP taking these medications       ibuprofen 200 MG tablet  Commonly known as:  ADVIL,MOTRIN     naproxen sodium 220 MG tablet  Commonly known as:  ANAPROX  Replaced by:  naproxen 500 MG tablet      TAKE these medications       acetaminophen 325 MG tablet  Commonly known as:  TYLENOL  Take 650 mg by mouth every 6 (six) hours as needed.     hydrochlorothiazide 25 MG tablet  Commonly known as:  HYDRODIURIL  Take 25 mg by mouth at bedtime.     HYDROmorphone 4 MG tablet  Commonly known as:  DILAUDID  Take 1 tablet (4 mg total) by mouth every 4 (four) hours as needed for moderate pain or severe pain.     lisinopril 40 MG tablet  Commonly known as:  PRINIVIL,ZESTRIL  Take 40 mg by mouth every morning.     naproxen 500 MG tablet  Commonly known as:  NAPROSYN  Take 1 tablet (500 mg total) by mouth 2 (two) times daily with a meal.           Follow-up Information   Follow up with Bowe Sidor C., MD In 3 weeks. (To follow up after your operation, To follow up after your hospital stay)    Specialty:  General  Surgery   Contact information:   [address] [place] [address] [phone]       Signed: Ardeth Sportsman. 06/25/2013, 11:29 AM

## 2013-06-25 NOTE — Progress Notes (Signed)
Pt has walked in halls, tolerated liquids without further nausea. Has  passed flatus and small bm. Pt desires discharge .

## 2013-06-27 ENCOUNTER — Encounter (HOSPITAL_COMMUNITY): Payer: Self-pay | Admitting: Surgery

## 2013-06-28 ENCOUNTER — Telehealth (INDEPENDENT_AMBULATORY_CARE_PROVIDER_SITE_OTHER): Payer: Self-pay | Admitting: General Surgery

## 2013-06-28 ENCOUNTER — Encounter (INDEPENDENT_AMBULATORY_CARE_PROVIDER_SITE_OTHER): Payer: Self-pay

## 2013-06-28 ENCOUNTER — Ambulatory Visit (INDEPENDENT_AMBULATORY_CARE_PROVIDER_SITE_OTHER): Payer: Medicare Other

## 2013-06-28 NOTE — Telephone Encounter (Signed)
Called the patient to let her know we received a verbal order per Dr. Lindie SpruceWyatt to bring the patient in for a nurse only appointment to remove the on-Q pain pump.  I made the appt for the patient today at 4:30.

## 2013-06-28 NOTE — Progress Notes (Unsigned)
Patient arrived to have ON Q Pain pump removal per DR. Lindie SpruceWyatt;  On Q Pain pump empty ;Don NON Latex gloves, Removed dressing , cleansed area with chlorea prep, Removed ON Q Pain Lines ; applied dressing to area; Spoke with DR. Cornett concerning intact Umbilicus dressing, Dr. Luisa Hartornett removed Dressing advised patient it is ok to shower and to keep area clean and dry . Patient verbalized understanding P/O with Dr. Michaell CowingGross 07-13-13@10am 

## 2013-06-28 NOTE — Telephone Encounter (Signed)
Patient called in explaining that she does not feel comfortable pulling her on-Q pain pump.  She would like to come into the office to have one of the staff pull it for her.  I explained that Dr. Michaell CowingGross is out of the office for 2 weeks, so I would have to take this around to our urgent office doctor in order to get a verbal order to bring her in for a nurse only appointment. I informed her that once we receive this, we would give her a call back at the number she provided [phone].

## 2013-07-13 ENCOUNTER — Encounter (INDEPENDENT_AMBULATORY_CARE_PROVIDER_SITE_OTHER): Payer: Medicare Other | Admitting: Surgery

## 2013-07-14 ENCOUNTER — Encounter (INDEPENDENT_AMBULATORY_CARE_PROVIDER_SITE_OTHER): Payer: Self-pay | Admitting: Surgery

## 2013-07-14 ENCOUNTER — Ambulatory Visit (INDEPENDENT_AMBULATORY_CARE_PROVIDER_SITE_OTHER): Payer: Medicare Other | Admitting: Surgery

## 2013-07-14 VITALS — BP 128/84 | HR 86 | Temp 97.6°F | Resp 16 | Ht 62.0 in | Wt 168.4 lb

## 2013-07-14 DIAGNOSIS — K430 Incisional hernia with obstruction, without gangrene: Secondary | ICD-10-CM

## 2013-07-14 NOTE — Progress Notes (Signed)
Subjective:     Patient ID: Cynthia MoseRhonda Y Hess, female   DOB: July 21, 1955, 58 y.o.   MRN: [id number]  HPI  Note: This dictation was prepared with Dragon/digital dictation along with [facility] technology. Any transcriptional errors that result from this process are unintentional.       Cynthia Hess  July 21, 1955 [id number]  Patient Care Team: Bobbye Mortonhristopher M Street, MD as PCP - General (Family Medicine)  Procedure (Date: 06/24/2013):  POST-OPERATIVE DIAGNOSIS: recurrent incisional abdominal wall hernias   PROCEDURE: Procedure(s):  LAPAROSCOPIC LYSIS OF ADHESIONS  LAPAROSCOPIC INCARCERATED INCISIONAL VENTRAL WALL HERNIAS  INSERTION OF MESH  SURGEON: Surgeon(s):  Ardeth SportsmanSteven C. Florella Mcneese, MD   OR FINDINGS: Numerous Swiss cheese type hernias from subxiphoid to the umbilicus. A 12 x 5 cm region total. Largest defect 3 x 3 cm. All incarcerated with greater omentum. Colon nearby and a few epiploic appendages within but no true colon incarcerated within.  Type of repair - Laparoscopic underlay repair  Name of mesh - Bard Ventralight dual sided (polypropylene / Seprafilm)  Size of mesh - Length 25 cm, Width 20 cm  Mesh overlap - 5-7 cm  Placement of mesh - Intraperitoneal underlay repair   This patient returns for surgical re-evaluation.  She is feeling well.  She does not like to take narcotics and was able to wean off those quickly.  Only using naproxen intermittently.  Tolerating 10-20 minute walks well.  No fevers or chills.  Possibly well.  Overall is happy how she is recovering so far.  Patient Active Problem List   Diagnosis Date Noted  . S/P hernia repair 06/24/2013  . Obesity (BMI 30-39.9) 06/03/2013  . Tobacco abuse 06/03/2013  . Lumbosacral spondylosis without myelopathy 10/03/2011  . Sciatica 10/03/2011  . DEPRESSION 02/19/2010  . TRANSAMINASES, SERUM, ELEVATED 02/19/2010  . WRIST PAIN, BILATERAL 01/09/2010  . CONSTIPATION 09/04/2008  . CHEST PAIN 09/04/2008  . HERNIATED LUMBOSACRAL  DISC 05/02/2008  . DEGENERATIVE JOINT DISEASE, LUMBAR SPINE 02/09/2008  . SUBACROMIAL BURSITIS, LEFT 10/30/2007  . DEGENERATIVE DISC DISEASE, CERVICAL SPINE 10/18/2007  . Recurrent incisional hernia with incarceration s/p lap repair w mesh 06/24/2013 05/21/2007  . MICROSCOPIC HEMATURIA 04/22/2007  . TRICHOMONIASIS 04/22/2007  . PAIN IN SOFT TISSUES OF LIMB 04/15/2007    Past Medical History  Diagnosis Date  . Arthritis     in low back  . Hypertension 2009  . Asthma 2009  . Hyperlipidemia 2013  . HERNIATED LUMBOSACRAL DISC 05/02/2008    Qualifier: Diagnosis of  By: Shannon, Armeniahina    . COSTOCHONDRITIS 09/04/2008    Qualifier: Diagnosis of  By: Daphine DeutscherMartin FNP, Zena AmosNykedtra    . NEPHROLITHIASIS 05/21/2007    Annotation: bilateral Qualifier: Diagnosis of  By: Daphine DeutscherMartin FNP, Zena AmosNykedtra    . DENTAL CARIES 02/04/2008    Qualifier: Diagnosis of  By: Daphine DeutscherMartin FNP, Zena AmosNykedtra    . CLOSED FRACTURE OF METATARSAL BONE 12/29/2008    Annotation: minimally displaced fracture through the fifth metatarsal Qualifier: Diagnosis of  By: Daphine DeutscherMartin FNP, Zena AmosNykedtra    . Depression     meds in the past/ denies 06/16/13    Past Surgical History  Procedure Laterality Date  . Hernia repair  1990s    reports total of 3 surgeries, most recently 2007  . Abdominal hysterectomy  1989    still gets pap smears  . Small intestine surgery  1989    for "blockage"  . Bladder repair  1989    ?bladder injury  . Tubal ligation  1979  .  Tonsillectomy  1967  . Ventral hernia repair  2007    open w mesh Dr Zachery DakinsWeatherly  . Umbilical hernia repair  2005    open w suture  . Ventral hernia repair N/A 06/24/2013    Procedure: LAPAROSCOPIC VENTRAL WALL HERNIA REPAIR;  Surgeon: Ardeth SportsmanSteven C. Merial Moritz, MD;  Location: [facility];  Service: General;  Laterality: N/A;  . Insertion of mesh N/A 06/24/2013    Procedure: INSERTION OF MESH;  Surgeon: Ardeth SportsmanSteven C. Samaria Anes, MD;  Location: [facility];  Service: General;  Laterality: N/A;    History   Social History  . Marital  Status: Single    Spouse Name: N/A    Number of Children: N/A  . Years of Education: N/A   Occupational History  . Not on file.   Social History Main Topics  . Smoking status: Current Every Day Smoker -- 0.50 packs/day for 10 years    Types: Cigarettes  . Smokeless tobacco: Never Used     Comment: cutting back  . Alcohol Use: Yes     Comment: Drinks 2x/month- usually 3-4 beers at a time   . Drug Use: No  . Sexual Activity: Not Currently    Partners: Male    Birth Control/ Protection: Surgical   Other Topics Concern  . Not on file   Social History Narrative   Previous MD was Dr. Philipp DeputyVollmer, last seen 08/2011; heard about us from a friend   Is coming for her back and arm      Has notes from Dr. Wynn BankerKirsteins (chronic pain mgmt)- non-narcotic txt only per most recent note 09/2011 due to UDS + for codeine, butalbital, ethanol      CHRONIC BACK PAIN PATIENT      Is currently unemployed, finished some high school    On disability for her back- had a fall in 2010       Lives with 57 yo daughter, Dario GuardianWanda Koogler   Uses cigarettes and EtOH, denies drugs      Most recent mammo 2012, pap 2011, CT/MRI 2012 and 2013 for back and hernia       Current meds:   Lisinopril 40 qd   Clonidine 0.1mg  qhs   Tramadol 50mg  1q6 but she is taking it q3   Ibuprofen 800mg  1-2 times per day, but she is taking it every 4 hours    Family History  Problem Relation Age of Onset  . Asthma Brother   . Diabetes Brother   . Hypertension Brother   . Alzheimer's disease Maternal Grandmother   . Early death Mother     when pt was [age]; poisoned  . Early death Father     when pt was 913, unknown   . Hypertension Brother     Current Outpatient Prescriptions  Medication Sig Dispense Refill  . acetaminophen (TYLENOL) 325 MG tablet Take 650 mg by mouth every 6 (six) hours as needed.      Marland Kitchen. lisinopril (PRINIVIL,ZESTRIL) 40 MG tablet Take 40 mg by mouth every morning.      . naproxen (NAPROSYN) 500 MG tablet Take 1  tablet (500 mg total) by mouth 2 (two) times daily with a meal.  40 tablet  1   No current facility-administered medications for this visit.     Allergies  Allergen Reactions  . Latex Itching and Swelling  . Banana Swelling    BP 128/84  Pulse 86  Temp(Src) 97.6 F (36.4 C) (Temporal)  Resp 16  Ht 5\' 2"  (1.575 m)  Wt 168 lb 6.4 oz (76.386 kg)  BMI 30.79 kg/m2  Dg Chest 2 View  06/16/2013   CLINICAL DATA:  Pre op  dr Zackry Deines repair abd hernias 06/24/13  EXAM: CHEST  2 VIEW  COMPARISON:  DG CHEST 1V PORT dated 09/01/2008  FINDINGS: The heart size and mediastinal contours are within normal limits. Both lungs are clear. The visualized skeletal structures are unremarkable.  IMPRESSION: No active cardiopulmonary disease.   Electronically Signed   By: Salome Holmes M.D.   On: 06/16/2013 11:34     Review of Systems  Constitutional: Negative for fever, chills and diaphoresis.  HENT: Negative for ear pain, sore throat and trouble swallowing.   Eyes: Negative for photophobia and visual disturbance.  Respiratory: Negative for cough and choking.   Cardiovascular: Negative for chest pain and palpitations.  Gastrointestinal: Negative for nausea, vomiting, abdominal pain, diarrhea, constipation, anal bleeding and rectal pain.  Genitourinary: Negative for dysuria, frequency and difficulty urinating.  Musculoskeletal: Negative for gait problem and myalgias.  Skin: Negative for color change, pallor and rash.  Neurological: Negative for dizziness, speech difficulty, weakness and numbness.  Hematological: Negative for adenopathy.  Psychiatric/Behavioral: Negative for confusion and agitation. The patient is not nervous/anxious.        Objective:   Physical Exam  Constitutional: She is oriented to person, place, and time. She appears well-developed and well-nourished. No distress.  HENT:  Head: Normocephalic.  Mouth/Throat: Oropharynx is clear and moist. No oropharyngeal exudate.  Eyes:  Conjunctivae and EOM are normal. Pupils are equal, round, and reactive to light. No scleral icterus.  Neck: Normal range of motion. No tracheal deviation present.  Cardiovascular: Normal rate and intact distal pulses.   Pulmonary/Chest: Effort normal. No respiratory distress. She exhibits no tenderness.  Abdominal: Soft. She exhibits no distension. There is no tenderness. No hernia. Hernia confirmed negative in the ventral area, confirmed negative in the right inguinal area and confirmed negative in the left inguinal area.    Incisions clean with normal healing ridges.  No hernias  Genitourinary: No vaginal discharge found.  Musculoskeletal: Normal range of motion. She exhibits no tenderness.  Lymphadenopathy:       Right: No inguinal adenopathy present.       Left: No inguinal adenopathy present.  Neurological: She is alert and oriented to person, place, and time. No cranial nerve deficit. She exhibits normal muscle tone. Coordination normal.  Skin: Skin is warm and dry. No rash noted. She is not diaphoretic.  Psychiatric: She has a normal mood and affect. Her behavior is normal.       Assessment:     Recovering well status post laparoscopic ventral wall hernia repair of Swiss cheese supraumbilical ventral hernia s    Plan:     Increase activity as tolerated to regular activity.  Low impact exercise such as walking an hour a day at least ideal.  Do not push through pain.  Diet as tolerated.  Low fat high fiber diet ideal.  Bowel regimen with 30 g fiber a day and fiber supplement as needed to avoid problems.  Return to clinic as needed.   Instructions discussed.  Followup with primary care physician for other health issues as would normally be done.  Consider screening for malignancies (breast, prostate, colon, melanoma, etc) as appropriate.  Questions answered.  The patient expressed understanding and appreciation

## 2013-07-14 NOTE — Patient Instructions (Signed)
Hernia A hernia occurs when an internal organ pushes out through a weak spot in the abdominal wall. Hernias most commonly occur in the groin and around the navel. Hernias often can be pushed back into place (reduced). Most hernias tend to get worse over time. Some abdominal hernias can get stuck in the opening (irreducible or incarcerated hernia) and cannot be reduced. An irreducible abdominal hernia which is tightly squeezed into the opening is at risk for impaired blood supply (strangulated hernia). A strangulated hernia is a medical emergency. Because of the risk for an irreducible or strangulated hernia, surgery may be recommended to repair a hernia. CAUSES   Heavy lifting.  Prolonged coughing.  Straining to have a bowel movement.  A cut (incision) made during an abdominal surgery. HOME CARE INSTRUCTIONS   Bed rest is not required. You may continue your normal activities.  Avoid lifting more than 10 pounds (4.5 kg) or straining.  Cough gently. If you are a smoker it is best to stop. Even the best hernia repair can break down with the continual strain of coughing. Even if you do not have your hernia repaired, a cough will continue to aggravate the problem.  Do not wear anything tight over your hernia. Do not try to keep it in with an outside bandage or truss. These can damage abdominal contents if they are trapped within the hernia sac.  Eat a normal diet.  Avoid constipation. Straining over long periods of time will increase hernia size and encourage breakdown of repairs. If you cannot do this with diet alone, stool softeners may be used. SEEK IMMEDIATE MEDICAL CARE IF:   You have a fever.  You develop increasing abdominal pain.  You feel nauseous or vomit.  Your hernia is stuck outside the abdomen, looks discolored, feels hard, or is tender.  You have any changes in your bowel habits or in the hernia that are unusual for you.  You have increased pain or swelling around the  hernia.  You cannot push the hernia back in place by applying gentle pressure while lying down. MAKE SURE YOU:   Understand these instructions.  Will watch your condition.  Will get help right away if you are not doing well or get worse. Document Released: 03/17/2005 Document Revised: 06/09/2011 Document Reviewed: 11/04/2007 [facility] Patient Information 2014 [place].   HERNIA REPAIR: POST OP INSTRUCTIONS  1. DIET: Follow a light bland diet the first 24 hours after arrival home, such as soup, liquids, crackers, etc.  Be sure to include lots of fluids daily.  Avoid fast food or heavy meals as your are more likely to get nauseated.  Eat a low fat the next few days after surgery. 2. Take your usually prescribed home medications unless otherwise directed. 3. PAIN CONTROL: a. Pain is best controlled by a usual combination of three different methods TOGETHER: i. Ice/Heat ii. Over the counter pain medication iii. Prescription pain medication b. Most patients will experience some swelling and bruising around the hernia(s) such as the bellybutton, groins, or old incisions.  Ice packs or heating pads (30-60 minutes up to 6 times a day) will help. Use ice for the first few days to help decrease swelling and bruising, then switch to heat to help relax tight/sore spots and speed recovery.  Some people prefer to use ice alone, heat alone, alternating between ice & heat.  Experiment to what works for you.  Swelling and bruising can take several weeks to resolve.   c. It is  helpful to take an over-the-counter pain medication regularly for the first few weeks.  Choose one of the following that works best for you: i. Naproxen (Aleve, etc)  Two 268m tabs twice a day ii. Ibuprofen (Advil, etc) Three 2023mtabs four times a day (every meal & bedtime) iii. Acetaminophen (Tylenol, etc) 325-65037mour times a day (every meal & bedtime) d. A  prescription for pain medication should be given to you upon  discharge.  Take your pain medication as prescribed.  i. If you are having problems/concerns with the prescription medicine (does not control pain, nausea, vomiting, rash, itching, etc), please call us [phone] see if we need to switch you to a different pain medicine that will work better for you and/or control your side effect better. ii. If you need a refill on your pain medication, please contact your pharmacy.  They will contact our office to request authorization. Prescriptions will not be filled after 5 pm or on week-ends. 4. Avoid getting constipated.  Between the surgery and the pain medications, it is common to experience some constipation.  Increasing fluid intake and taking a fiber supplement (such as Metamucil, Citrucel, FiberCon, MiraLax, etc) 1-2 times a day regularly will usually help prevent this problem from occurring.  A mild laxative (prune juice, Milk of Magnesia, MiraLax, etc) should be taken according to package directions if there are no bowel movements after 48 hours.   5. Wash / shower every day.  You may shower over the dressings as they are waterproof.   6. Remove your waterproof bandages 5 days after surgery.  You may leave the incision open to air.  You may replace a dressing/Band-Aid to cover the incision for comfort if you wish.  Continue to shower over incision(s) after the dressing is off.    7. ACTIVITIES as tolerated:   a. You may resume regular (light) daily activities beginning the next day-such as daily self-care, walking, climbing stairs-gradually increasing activities as tolerated.  If you can walk 30 minutes without difficulty, it is safe to try more intense activity such as jogging, treadmill, bicycling, low-impact aerobics, swimming, etc. b. Save the most intensive and strenuous activity for last such as sit-ups, heavy lifting, contact sports, etc  Refrain from any heavy lifting or straining until you are off narcotics for pain control.   c. DO NOT  PUSH THROUGH PAIN.  Let pain be your guide: If it hurts to do something, don't do it.  Pain is your body warning you to avoid that activity for another week until the pain goes down. d. You may drive when you are no longer taking prescription pain medication, you can comfortably wear a seatbelt, and you can safely maneuver your car and apply brakes. e. YouDennis Basty have sexual intercourse when it is comfortable.  8. FOLLOW UP in our office a. Please call [facility] at [phone] to set up an appointment to see your surgeon in the office for a follow-up appointment approximately 2-3 weeks after your surgery. b. Make sure that you call for this appointment the day you arrive home to insure a convenient appointment time. 9.  IF YOU HAVE DISABILITY OR FAMILY LEAVE FORMS, BRING THEM TO THE OFFICE FOR PROCESSING.  DO NOT GIVE THEM TO YOUR DOCTOR.  WHEN TO CALL US [phone]. Poor pain control 2. Reactions / problems with new medications (rash/itching, nausea, etc)  3. Fever over 101.5 F (38.5 C) 4. Inability to urinate 5. Nausea and/or vomiting  6. Worsening swelling or bruising 7. Continued bleeding from incision. 8. Increased pain, redness, or drainage from the incision   The clinic staff is available to answer your questions during regular business hours (8:30am-5pm).  Please don't hesitate to call and ask to speak to one of our nurses for clinical concerns.   If you have a medical emergency, go to the nearest emergency room or call 911.  A surgeon from [facility] is always on call at the hospitals in [facility], [place], [place], [place], [place]  [address] ?  [address] [address], [place], [place]   [address] MAIN: [id number] ? TOLL FREE: [phone] ? FAX: [phone] [email or web address]  Managing Pain  Pain after surgery or related to activity is often due to strain/injury to muscle, tendon, nerves and/or incisions.  This  pain is usually short-term and will improve in a few months.   Many people find it helpful to do the following things TOGETHER to help speed the process of healing and to get back to regular activity more quickly:  1. Avoid heavy physical activity a.  no lifting greater than 20 pounds b. Do not "push through" the pain.  Listen to your body and avoid positions and maneuvers than reproduce the pain c. Walking is okay as tolerated, but go slowly and stop when getting sore.  d. Remember: If it hurts to do it, then don't do it! 2. Take Anti-inflammatory medication  a. Take with food/snack around the clock for 1-2 weeks i. This helps the muscle and nerve tissues become less irritable and calm down faster b. Choose ONE of the following over-the-counter medications: i. Naproxen 29m tabs (ex. Aleve) 1-2 pills twice a day  ii. Ibuprofen 2021mtabs (ex. Advil, Motrin) 3-4 pills with every meal and just before bedtime iii. Acetaminophen 5001mabs (Tylenol) 1-2 pills with every meal and just before bedtime 3. Use a Heating pad or Ice/Cold Pack a. 4-6 times a day b. May use warm bath/hottub  or showers 4. Try Gentle Massage and/or Stretching  a. at the area of pain many times a day b. stop if you feel pain - do not overdo it  Try these steps together to help you body heal faster and avoid making things get worse.  Doing just one of these things may not be enough.    If you are not getting better after two weeks or are noticing you are getting worse, contact our office for further advice; we may need to re-evaluate you & see what other things we can do to help.  Exercise to Stay Healthy Exercise helps you become and stay healthy. EXERCISE IDEAS AND TIPS Choose exercises that:  You enjoy.  Fit into your day. You do not need to exercise really hard to be healthy. You can do exercises at a slow or medium level and stay healthy. You can:  Stretch before and after working out.  Try yoga,  Pilates, or tai chi.  Lift weights.  Walk fast, swim, jog, run, climb stairs, bicycle, dance, or rollerskate.  Take aerobic classes. Exercises that burn about 150 calories:  Running 1  miles in 15 minutes.  Playing volleyball for 45 to 60 minutes.  Washing and waxing a car for 45 to 60 minutes.  Playing touch football for 45 minutes.  Walking 1  miles in 35 minutes.  Pushing a stroller 1  miles in 30 minutes.  Playing basketball for 30 minutes.  Raking leaves for 30 minutes.  Bicycling 5 miles in 30 minutes.  Walking 2 miles in 30 minutes.  Dancing for 30 minutes.  Shoveling snow for 15 minutes.  Swimming laps for 20 minutes.  Walking up stairs for 15 minutes.  Bicycling 4 miles in 15 minutes.  Gardening for 30 to 45 minutes.  Jumping rope for 15 minutes.  Washing windows or floors for 45 to 60 minutes. Document Released: 04/19/2010 Document Revised: 06/09/2011 Document Reviewed: 04/19/2010 [facility] Patient Information 2014 [place], [place].

## 2013-07-31 ENCOUNTER — Other Ambulatory Visit: Payer: Self-pay | Admitting: Family Medicine

## 2013-08-03 ENCOUNTER — Encounter: Payer: Medicare Other | Admitting: Family Medicine

## 2013-08-04 ENCOUNTER — Other Ambulatory Visit (HOSPITAL_COMMUNITY): Payer: Self-pay | Admitting: Surgery

## 2013-09-01 ENCOUNTER — Encounter: Payer: Self-pay | Admitting: Family Medicine

## 2013-09-01 ENCOUNTER — Ambulatory Visit (INDEPENDENT_AMBULATORY_CARE_PROVIDER_SITE_OTHER): Payer: Medicare Other | Admitting: Family Medicine

## 2013-09-01 ENCOUNTER — Other Ambulatory Visit (HOSPITAL_COMMUNITY)
Admission: RE | Admit: 2013-09-01 | Discharge: 2013-09-01 | Disposition: A | Discharge status: Home / Self Care | Payer: Medicare Other | Source: Ambulatory Visit | Attending: Family Medicine | Admitting: Family Medicine

## 2013-09-01 VITALS — BP 145/71 | HR 72 | Temp 98.2°F | Ht 62.0 in | Wt 161.0 lb

## 2013-09-01 DIAGNOSIS — Z124 Encounter for screening for malignant neoplasm of cervix: Secondary | ICD-10-CM

## 2013-09-01 DIAGNOSIS — E669 Obesity, unspecified: Secondary | ICD-10-CM

## 2013-09-01 DIAGNOSIS — I10 Essential (primary) hypertension: Secondary | ICD-10-CM

## 2013-09-01 DIAGNOSIS — Z01419 Encounter for gynecological examination (general) (routine) without abnormal findings: Secondary | ICD-10-CM

## 2013-09-01 DIAGNOSIS — Z1151 Encounter for screening for human papillomavirus (HPV): Secondary | ICD-10-CM | POA: Insufficient documentation

## 2013-09-01 HISTORY — DX: Essential (primary) hypertension: I10

## 2013-09-01 MED ORDER — NAPROXEN 500 MG PO TABS: 500.0000 mg | ORAL_TABLET | Freq: Two times a day (BID) | ORAL | Status: DC | PRN

## 2013-09-01 MED ORDER — CLONIDINE HCL 0.1 MG PO TABS: 0.1000 mg | ORAL_TABLET | Freq: Every day | ORAL | Status: DC

## 2013-09-01 NOTE — Patient Instructions (Signed)
Thank you for coming in, today!  I will call you or send you a letter with your Pap and other lab results. Make sure you get your mammogram and colonoscopy done. I will refill your naproxen prescription.  For your blood pressure: Keep taking your lisinopril. START taking clonidine again, one pill at bedtime. If you miss a dose of the clonidine, your blood pressure can shoot up very high, so be consistent with taking it.  For your weight: Try to walk as much as possible, at least 30 minutes at a time. Look at the website https://www.bernard.org/ for information about diet changes like portion control. You can call Dr. Gerilyn Pilgrim (our nutritionist) for an appointment to talk about making changes to your diet. I will give her card to you -- you have to call her, yourself.  Follow up with Dr. Michaell Cowing in another 4-8 weeks if you still have abdominal pain at that point. Come back to see me with any specific issues as you need, or for a well visit in 1 year. Please feel free to call with any questions or concerns at any time, at [phone]. --Dr. Casper Harrison

## 2013-09-01 NOTE — Progress Notes (Signed)
Subjective:    Patient ID: Cynthia Hess, female    DOB: 1955/04/16, 58 y.o.   MRN: [id number]  HPI: Pt presents to clinic for her annual physical exam. Current complaints include chronic back pain and left hip pain, both at baseline, helped with Naprosyn, for which she requests a refill. She recently had hernia surgery by Cynthia Hess and has some residual soreness but overall is doing well. She reports compliance with lisinopril for her HTN, but is not taking clonidine "for a while." She denies chest pain, SOB, headache, change in vision or sensation, or LE swelling. She is not walking or exercising regularly. She is due for pap smear, mammogram, and colonoscopy. She does report frequent urination, but no other symptoms such as pain, fever/chills, N/V, change in bowel habits, dizziness or jitteriness, or sweating to suggest hypo- or hyperglycemia; note pt has a history of bladder injury and surgery in the remote past and doesn't feel like she has been urinating more than usual, but just that she urinates a lot, all the time.  Family History  Problem Relation Age of Onset  . Asthma Brother   . Diabetes Brother   . Hypertension Brother   . Alzheimer's disease Maternal Grandmother   . Early death Mother     when pt was [age]; poisoned  . Early death Father     when pt was [age], unknown   . Hypertension Brother    Past Surgical History  Procedure Laterality Date  . Hernia repair  1990s    reports total of 3 surgeries, most recently 2007  . Abdominal hysterectomy  1989    still gets pap smears  . Small intestine surgery  1989    for "blockage"  . Bladder repair  1989    ?bladder injury  . Tubal ligation  1979  . Tonsillectomy  1967  . Ventral hernia repair  2007    open w mesh Cynthia Hess  . Umbilical hernia repair  2005    open w suture  . Ventral hernia repair N/A 06/24/2013    Procedure: LAPAROSCOPIC VENTRAL WALL HERNIA REPAIR;  Surgeon: Cynthia SportsmanSteven C. Gross, MD;  Location: [facility];  Service:  General;  Laterality: N/A;  . Insertion of mesh N/A 06/24/2013    Procedure: INSERTION OF MESH;  Surgeon: Cynthia SportsmanSteven C. Gross, MD;  Location: [facility];  Service: General;  Laterality: N/A;    History   Social History  . Marital Status: Single    Spouse Name: N/A    Number of Children: N/A  . Years of Education: N/A   Occupational History  . Not on file.   Social History Main Topics  . Smoking status: Current Every Day Smoker -- 0.50 packs/day for 10 years    Types: Cigarettes  . Smokeless tobacco: Never Used     Comment: cutting back  . Alcohol Use: Yes     Comment: Drinks 2x/month- usually 3-4 beers at a time   . Drug Use: No  . Sexual Activity: Not Currently    Partners: Male    Birth Control/ Protection: Surgical   Other Topics Concern  . Not on file   Social History Narrative   Previous MD was Cynthia Hess, last seen 08/2011; heard about us from a friend   Is coming for her back and arm      Has notes from Cynthia Hess (chronic pain mgmt)- non-narcotic txt only per most recent note 09/2011 due to UDS + for codeine, butalbital,  ethanol      CHRONIC BACK PAIN PATIENT      Is currently unemployed, finished some high school    On disability for her back- had a fall in 2010       Lives with 75 yo daughter, Cynthia Hess   Uses cigarettes and EtOH, denies drugs      Most recent mammo 2012, pap 2011, CT/MRI 2012 and 2013 for back and hernia       Current meds:   Lisinopril 40 qd   Clonidine 0.1mg  qhs   Tramadol 50mg  1q6 but she is taking it q3   Ibuprofen 800mg  1-2 times per day, but she is taking it every 4 hours   Pt is a current smoker. Currently cutting back, about 1 pack per week. In addition to the above documentation, pt's PMH, surgical history, FH, and SH all reviewed and updated where appropriate in the EMR. I have also reviewed and updated the pt's allergies and current medications as appropriate.  Review of Systems: As above. Otherwise, full 12-system ROS was  reviewed and all negative.     Objective:   Physical Exam BP 145/71  Pulse 72  Temp(Src) 98.2 F (36.8 C)  Ht 5\' 2"  (1.575 m)  Wt 161 lb (73.029 kg)  BMI 29.44 kg/m2 Gen: well-appearing obese female in NAD HEENT: [place]/AT, sclerae/conjunctivae clear, no lid lag, EOMI, PERRLA   MMM, posterior oropharynx clear, no cervical lymphadenopathy  neck supple with full ROM, no masses appreciated; thyroid not enlarged  Cardio: RRR, no murmur appreciated; distal pulses intact/symmetric Pulm: CTAB, no wheezes, normal WOB  Abd: soft, nondistended, BS+, no HSM GU: external genital structures normal without lesions  Speculum exam: normal vaginal walls, no discharge or bleeding, no cervical lesions Ext: warm/well-perfused, no cyanosis/clubbing/edema MSK: strength 5/5 in all four extremities, no frank joint deformity/effusion  normal ROM to all four extremities  Diffuse tenderness in low back and around left hip, worse with movement Neuro/Psych: alert/oriented, sensation grossly intact; normal gait/balance  mood euthymic with congruent affect     Assessment & Plan:  58yo female with chronic back and hip pain, recently with hernia surgery, doing well. - HTN elevated, but not compliant with all meds -- refilled lisinopril and restarting clonidine qHS - refilled Naprosyn and instructed pt to f/u with Cynthia. Michaell Cowing as needed post-op  Anticipatory guidance - discussed maintenance of healthy weight and exercise habits - discussed safe sex practices and good body mechanics at work - discussed importance of regular health screenings (see below)  Risk factor reduction - strongly advised complete smoking cessation; pt cutting back on her own, for now - discussed diet improvements in detail; suggested https://www.bernard.org/ as a resource and referred to Cynthia. Gerilyn Hess - discussed importance of maintaining BP control to reduce risk of adverse CV events  Immunization / screening / ancillary studies  - basic labs drawn  today (CMP, lipid panel); may need to start statin medication in the future - Pap performed today, will f/u results as needed - provided information to get mammogram and colonoscopy done  Follow up in 1 year for wellness visit, or as needed for any acute issues.  Bobbye Morton, MD PGY-2, [facility] 09/01/2013, 6:29 PM

## 2013-09-02 ENCOUNTER — Encounter: Payer: Self-pay | Admitting: Family Medicine

## 2013-09-02 LAB — LIPID PANEL
Cholesterol: 186 mg/dL (ref 0–200)
HDL: 41 mg/dL (ref >39)
LDL CALC: 86 mg/dL (ref 0–99)
Total CHOL/HDL Ratio: 4.5 Ratio
Triglycerides: 293 mg/dL — ABNORMAL HIGH (ref <150)
VLDL: 59 mg/dL — ABNORMAL HIGH (ref 0–40)

## 2013-09-02 LAB — COMPREHENSIVE METABOLIC PANEL
ALBUMIN: 4 g/dL (ref 3.5–5.2)
ALK PHOS: 70 U/L (ref 39–117)
ALT: 14 U/L (ref 0–35)
AST: 19 U/L (ref 0–37)
BUN: 14 mg/dL (ref 6–23)
CALCIUM: 9.6 mg/dL (ref 8.4–10.5)
CHLORIDE: 108 meq/L (ref 96–112)
CO2: 25 mEq/L (ref 19–32)
Creat: 0.6 mg/dL (ref 0.50–1.10)
Glucose, Bld: 79 mg/dL (ref 70–99)
Potassium: 3.5 mEq/L (ref 3.5–5.3)
SODIUM: 140 meq/L (ref 135–145)
TOTAL PROTEIN: 6.8 g/dL (ref 6.0–8.3)
Total Bilirubin: 0.3 mg/dL (ref 0.2–1.2)

## 2013-09-05 ENCOUNTER — Telehealth: Payer: Self-pay | Admitting: Family Medicine

## 2013-09-05 LAB — CYTOLOGY - PAP

## 2013-09-05 MED ORDER — METRONIDAZOLE 500 MG PO TABS: 500.0000 mg | ORAL_TABLET | Freq: Three times a day (TID) | ORAL | Status: DC

## 2013-09-05 NOTE — Telephone Encounter (Signed)
Called and explained diagnosis and treatment plan to patient.Cynthia Hess

## 2013-09-05 NOTE — Telephone Encounter (Signed)
Called pt and left message on self-identifying voice mail to inform her that her Pap smear was negative but that it incidentally showed Trichomonas; explained without detail that this is an infection and that I will call in an antibiotic for her. Instructed her to call back if she has questions or wants more detail.  If patient calls back, please relay the following: Trichomonas is usually sexually-transmitted but is usually easily treated and does not typically cause lasting problems; she did not sound like she even had significant symptoms from it, when I saw her at her appointment, which is not unusual. Both Cynthia Hess and her partner should be treated; this means that she can come back in for an appointment if she wants, at her convenience, but I will call in an antibiotic (Flagyl, AKA metronidazole, 500 mg three times per day, for 7 days) for her to take. Her partner should see a doctor as well, to be tested and/or treated. She should not drink alcohol while taking this medication. If she has any other questions or concerns, she can let us know or come back for an appointment, as she needs. Thanks! --CMS

## 2013-09-05 NOTE — Telephone Encounter (Signed)
Pt called and would like Dr. Casper Harrison to call her since she didn't understand the message and what infection she had. jw

## 2013-10-06 ENCOUNTER — Other Ambulatory Visit: Payer: Self-pay | Admitting: Family Medicine

## 2013-10-06 ENCOUNTER — Ambulatory Visit (HOSPITAL_COMMUNITY): Payer: Medicare Other

## 2013-10-26 ENCOUNTER — Encounter (HOSPITAL_COMMUNITY): Payer: Self-pay | Admitting: Emergency Medicine

## 2013-10-26 ENCOUNTER — Emergency Department (INDEPENDENT_AMBULATORY_CARE_PROVIDER_SITE_OTHER)
Admission: EM | Admit: 2013-10-26 | Discharge: 2013-10-26 | Disposition: A | Discharge status: Home / Self Care | Payer: Medicare Other | Source: Home / Self Care | Attending: Family Medicine | Admitting: Family Medicine

## 2013-10-26 DIAGNOSIS — I10 Essential (primary) hypertension: Secondary | ICD-10-CM | POA: Diagnosis not present

## 2013-10-26 DIAGNOSIS — K439 Ventral hernia without obstruction or gangrene: Secondary | ICD-10-CM

## 2013-10-26 DIAGNOSIS — W19XXXA Unspecified fall, initial encounter: Secondary | ICD-10-CM

## 2013-10-26 MED ORDER — CYCLOBENZAPRINE HCL 5 MG PO TABS: 5.0000 mg | ORAL_TABLET | Freq: Three times a day (TID) | ORAL | Status: DC | PRN

## 2013-10-26 MED ORDER — KETOROLAC TROMETHAMINE 60 MG/2ML IM SOLN
INTRAMUSCULAR | Status: AC
  Filled 2013-10-26: qty 2

## 2013-10-26 MED ORDER — KETOROLAC TROMETHAMINE 60 MG/2ML IM SOLN
60.0000 mg | Freq: Once | INTRAMUSCULAR | Status: AC
  Administered 2013-10-26: 60 mg via INTRAMUSCULAR

## 2013-10-26 NOTE — Discharge Instructions (Signed)
There is no evidence of permenant or significant injury from your fall The injection we gave you should help with your pain significantly Please use the flexeril as needed for continue muscle tightness and spasm Please start taking tylenol 1000mg  every 8 hours Please restart taking advil in 24 hrs Please stay active but use heat, ice, and massage as needed for relief Your hernia repair looks intact and normal

## 2013-10-26 NOTE — ED Provider Notes (Signed)
CSN: [id number]     Arrival date & time 10/26/13  [id number] History   First MD Initiated Contact with Patient 10/26/13 [phone]     Chief Complaint  Patient presents with  . Fall   (Consider location/radiation/quality/duration/timing/severity/associated sxs/prior Treatment) HPI  Fall: occurred around 10pm . Going to bathroom and slipped on wet kitchen floor. Tried to break fall by grabbing counter but was unable and hit the vinyl floor. Pt reports hitting L buttock first then L side. Denies hitting head or shoulder. Able to get up with some assistance. Naproxen and advil 400 w/o benefit. Denies lightheadedness, CP, palpitations, syncope, confusion. Went to bed w/ general soreness on L side which is worse today. Able to ambulate w/o difficulty  HTN: takes medications as prescribed. Denies CP, SOB, palpitations.   Lap Ventral hernai: repaired a couple months ago. No abd pain. Passing stools.   Past Medical History  Diagnosis Date  . Arthritis     in low back  . Hypertension 2009  . Asthma 2009  . Hyperlipidemia 2013  . HERNIATED LUMBOSACRAL DISC 05/02/2008    Qualifier: Diagnosis of  By: Shannon, Armeniahina    . COSTOCHONDRITIS 09/04/2008    Qualifier: Diagnosis of  By: Daphine DeutscherMartin FNP, Zena AmosNykedtra    . NEPHROLITHIASIS 05/21/2007    Annotation: bilateral Qualifier: Diagnosis of  By: Daphine DeutscherMartin FNP, Zena AmosNykedtra    . DENTAL CARIES 02/04/2008    Qualifier: Diagnosis of  By: Daphine DeutscherMartin FNP, Zena AmosNykedtra    . CLOSED FRACTURE OF METATARSAL BONE 12/29/2008    Annotation: minimally displaced fracture through the fifth metatarsal Qualifier: Diagnosis of  By: Daphine DeutscherMartin FNP, Zena AmosNykedtra    . Depression     meds in the past/ denies 06/16/13   Past Surgical History  Procedure Laterality Date  . Hernia repair  1990s    reports total of 3 surgeries, most recently 2007  . Abdominal hysterectomy  1989    still gets pap smears  . Small intestine surgery  1989    for "blockage"  . Bladder repair  1989    ?bladder injury  . Tubal ligation   1979  . Tonsillectomy  1967  . Ventral hernia repair  2007    open w mesh Dr Zachery DakinsWeatherly  . Umbilical hernia repair  2005    open w suture  . Ventral hernia repair N/A 06/24/2013    Procedure: LAPAROSCOPIC VENTRAL WALL HERNIA REPAIR;  Surgeon: Ardeth SportsmanSteven C. Gross, MD;  Location: [facility];  Service: General;  Laterality: N/A;  . Insertion of mesh N/A 06/24/2013    Procedure: INSERTION OF MESH;  Surgeon: Ardeth SportsmanSteven C. Gross, MD;  Location: [facility];  Service: General;  Laterality: N/A;   Family History  Problem Relation Age of Onset  . Asthma Brother   . Diabetes Brother   . Hypertension Brother   . Alzheimer's disease Maternal Grandmother   . Early death Mother     when pt was [age]; poisoned  . Early death Father     when pt was [age], unknown   . Hypertension Brother    History  Substance Use Topics  . Smoking status: Current Every Day Smoker -- 0.15 packs/day for 10 years    Types: Cigarettes  . Smokeless tobacco: Never Used     Comment: cutting back  . Alcohol Use: Yes     Comment: Drinks 2x/month- usually 3-4 beers at a time    OB History   Grav Para Term Preterm Abortions TAB SAB Ect Mult Living  4 3 3  1  1   3      Review of Systems Per HPI with all other pertinent systems negative.   Allergies  Latex and Banana  Home Medications   Prior to Admission medications   Medication Sig Start Date End Date Taking? Authorizing Provider  lisinopril (PRINIVIL,ZESTRIL) 40 MG tablet Take 1 tablet (40 mg total) by mouth daily.   Yes Stephanie Coup Street, MD  naproxen (NAPROSYN) 500 MG tablet TAKE 1 TABLET BY MOUTH TWICE DAILY AS NEEDED. TAKE WITH FOOD. 10/06/13  Yes Stephanie Coup Street, MD  acetaminophen (TYLENOL) 325 MG tablet Take 650 mg by mouth every 6 (six) hours as needed.    Historical Provider, MD  cloNIDine (CATAPRES) 0.1 MG tablet Take 1 tablet (0.1 mg total) by mouth at bedtime. 09/01/13   Stephanie Coup Street, MD  cyclobenzaprine (FLEXERIL) 5 MG tablet Take 1 tablet (5 mg total) by  mouth 3 (three) times daily as needed for muscle spasms. 10/26/13   Ozella Rocks, MD  metroNIDAZOLE (FLAGYL) 500 MG tablet Take 1 tablet (500 mg total) by mouth 3 (three) times daily. 09/05/13   Stephanie Coup Street, MD   BP 152/84  Pulse 71  Temp(Src) 97.9 F (36.6 C) (Oral)  Resp 18  SpO2 99% Physical Exam  Constitutional: She is oriented to person, place, and time. She appears well-developed and well-nourished. No distress.  HENT:  Head: Normocephalic and atraumatic.  Eyes: EOM are normal. Pupils are equal, round, and reactive to light.  Neck: Normal range of motion. Neck supple.  Cardiovascular: Normal rate, normal heart sounds and intact distal pulses.  Exam reveals no gallop.   No murmur heard. Pulmonary/Chest: Effort normal and breath sounds normal. No respiratory distress.  Abdominal: Soft. She exhibits no distension and no mass. There is no tenderness. There is no rebound and no guarding.  No hernia recurrance w/ increased abd pressure during sitting up or leg lifting.   Musculoskeletal: Normal range of motion. She exhibits no edema.  Mild tenderness adn muscle stiffness along the L trapezius and Gluteus. No bony abnormality.  Shoulder FROM. Ambulation w/o difficulty. L wrist nml  Neurological: She is oriented to person, place, and time.  Skin: Skin is warm and dry. No rash noted. She is not diaphoretic. No erythema. No pallor.  Psychiatric: She has a normal mood and affect. Her behavior is normal. Judgment normal.    ED Course  Procedures (including critical care time) Labs Review Labs Reviewed - No data to display  Imaging Review No results found.   MDM   1. Fall, initial encounter   2. Essential hypertension   3. Ventral hernia without obstruction or gangrene    Mechanical fall. Well appearing. Only superficial soft tissue/muscle injury and spasm. toradol in office. Start flexeril, tylenol 1000mg  TID, heat/ice, massage, ROM exercises an maintaining a normal  activity level.   Hernia: no sign of recurrance or pain from mesh. F/u surgery as needed.    Precautions given and all questions answered  Shelly Flatten, MD Family Medicine 10/26/2013, 9:19 AM       Ozella Rocks, MD 10/26/13 [phone]

## 2013-10-26 NOTE — ED Notes (Signed)
Pt reports she fell last night in the kitchen; slipped on wet floor onto wood flooring Landed on left side; c/o left arm, lower back, left hip pain Ambulated well to exam room w/NAD Alert w/no signs of acute distress.

## 2013-10-31 ENCOUNTER — Telehealth: Payer: Self-pay | Admitting: Family Medicine

## 2013-10-31 ENCOUNTER — Ambulatory Visit (HOSPITAL_COMMUNITY)
Admission: RE | Admit: 2013-10-31 | Discharge: 2013-10-31 | Disposition: A | Discharge status: Home / Self Care | Payer: Medicare Other | Source: Ambulatory Visit | Attending: Family Medicine | Admitting: Family Medicine

## 2013-10-31 DIAGNOSIS — Z1231 Encounter for screening mammogram for malignant neoplasm of breast: Secondary | ICD-10-CM | POA: Diagnosis not present

## 2013-10-31 NOTE — Telephone Encounter (Signed)
Requesting an order for a xray on her Left hip due to a fall last week. Was seen at urgent care after fall but patient states she is still bruised up and has a knot. Please advise.

## 2013-11-01 NOTE — Telephone Encounter (Signed)
Appt made for tomorrow with Dr. Gayla DossJoyner.  Jazmin Hartsell, CMA

## 2013-11-01 NOTE — Telephone Encounter (Signed)
Pt will need an office visit to be evaluated, if she feels like she needs xrays. Please let her know. Thanks. --CMS

## 2013-11-02 ENCOUNTER — Encounter: Payer: Self-pay | Admitting: Family Medicine

## 2013-11-02 ENCOUNTER — Ambulatory Visit (INDEPENDENT_AMBULATORY_CARE_PROVIDER_SITE_OTHER): Payer: Medicare Other | Admitting: Family Medicine

## 2013-11-02 VITALS — BP 124/90 | HR 72 | Temp 98.4°F | Wt 157.0 lb

## 2013-11-02 DIAGNOSIS — M25552 Pain in left hip: Secondary | ICD-10-CM

## 2013-11-02 DIAGNOSIS — Z5189 Encounter for other specified aftercare: Secondary | ICD-10-CM

## 2013-11-02 DIAGNOSIS — M25559 Pain in unspecified hip: Secondary | ICD-10-CM

## 2013-11-02 DIAGNOSIS — Y92009 Unspecified place in unspecified non-institutional (private) residence as the place of occurrence of the external cause: Secondary | ICD-10-CM

## 2013-11-02 DIAGNOSIS — W19XXXD Unspecified fall, subsequent encounter: Secondary | ICD-10-CM

## 2013-11-02 DIAGNOSIS — W19XXXA Unspecified fall, initial encounter: Secondary | ICD-10-CM

## 2013-11-02 HISTORY — DX: Pain in left hip: M25.552

## 2013-11-02 LAB — CBC
HCT: 37 % (ref 36.0–46.0)
Hemoglobin: 13.2 g/dL (ref 12.0–15.0)
MCH: 33.1 pg (ref 26.0–34.0)
MCHC: 35.7 g/dL (ref 30.0–36.0)
MCV: 92.7 fL (ref 78.0–100.0)
Platelets: 350 K/uL (ref 150–400)
RBC: 3.99 MIL/uL (ref 3.87–5.11)
RDW: 13.9 % (ref 11.5–15.5)
WBC: 6.2 K/uL (ref 4.0–10.5)

## 2013-11-02 MED ORDER — TRAMADOL HCL 50 MG PO TABS: 50.0000 mg | ORAL_TABLET | Freq: Three times a day (TID) | ORAL | Status: DC | PRN

## 2013-11-02 NOTE — Assessment & Plan Note (Signed)
Pertinent S&O  Lateral hip pain and bruises s/p fall at home landing on left hip. Able to stand/walk immediately afterwards and today without antalgic gait  Evaluated by Urgent care the following day: Pain unrelieved by flexeril and naproxen which she has been taken hourly  Normal range of motion and strength in left hip, left lower extremity neurovascularly intact Assessment  No current concerns for fracture  Likely soft tissue versus bone bruising Plan  DC all NSAIDs; check CBC and CMP today  Ultram 50mg  # 20 given; discuss this was a one time Rx  Educated Pt on back/hip stretching and exercises  Recommend ice/heat depending on patient preference and pain relief

## 2013-11-02 NOTE — Progress Notes (Signed)
   Subjective:    Patient ID: Cynthia MoseRhonda Y Hess, female    DOB: 10-11-1955, 58 y.o.   MRN: [id number]  HPI Comments: Cynthia Hess comes in today for evaluation of left hip pain.she reports slipping in her kitchen Tuesday, and falling backwards, landing on her left hip/buttocks.  She denies any immediate pain at that time and was able to stand up and ambulate independently.  She was evaluated in urgent care the following day for lateral left hip aching as well as left wrist pain. No imaging was done, and she was giving Flexeril to help with pain.  Today she reports that her left hip continues to throb and has been unrelieved by her Flexeril and naproxen, which she is taking every hour. She denies any previous hip trauma or surgeries.  She denies any lower extremity numbness, tingling, weakness.  She denies any melena, hematochezia, or history of GI ulcers.    Review of Systems See HPI     Objective:   Physical Exam  Musculoskeletal:  Left Hip: 4-5 cm lateral left hip bruising w/ tenderness to palpation of lateral hip, No tenderness to greater trochanter. Full ROM Strength 5/5 for IR, ER, Flexion, Extension, Abduction, Adduction.  No pain with FABER or FADIR. No SI joint tenderness and normal minimal SI movement.  MSK: patellar reflexes 2+ bilaterally.  Bilateral lower extremity sensation intact.  Lower extremity strength: Knee extension, flexion; ankle plantar flexion and dorsiflexion 5/5. DP pulses 2+ b/l   Assessment/Plan:      See Problem Focused Assessment & Plan

## 2013-11-02 NOTE — Patient Instructions (Signed)

## 2013-11-03 ENCOUNTER — Encounter: Payer: Self-pay | Admitting: Family Medicine

## 2013-11-03 LAB — BASIC METABOLIC PANEL
BUN: 13 mg/dL (ref 6–23)
CALCIUM: 8.7 mg/dL (ref 8.4–10.5)
CO2: 24 mEq/L (ref 19–32)
Chloride: 107 mEq/L (ref 96–112)
Creat: 0.65 mg/dL (ref 0.50–1.10)
Glucose, Bld: 93 mg/dL (ref 70–99)
Potassium: 3.3 mEq/L — ABNORMAL LOW (ref 3.5–5.3)
SODIUM: 140 meq/L (ref 135–145)

## 2013-11-14 ENCOUNTER — Telehealth: Payer: Self-pay | Admitting: Family Medicine

## 2013-11-14 NOTE — Telephone Encounter (Signed)
Saw Dr. Gayla DossJoyner on Aug 5th for hip pain.  Given Tramadol for medication.  Patient requesting something different because the Tramadol didn't help with pain.

## 2013-11-14 NOTE — Telephone Encounter (Signed)
If pt is still having pain that her current meds are not helping, she needs to come back to be seen again, in our clinic or urgent care; neither I nor other providers can prescribe pain medication without evaluating her. If she prefers, she can call Sports Medicine to be evaluated, there (which may be a better option for her, since she has seen urgent care and our clinic, already). Sports Medicine's number is [phone] ([phone]). Please call her to let her know. Thanks! --CMS

## 2013-11-17 NOTE — Telephone Encounter (Signed)
Called patient, no answer, no voicemail. If patient returns call please have her schedule an appointment either with us or Sports medicine

## 2013-11-21 ENCOUNTER — Other Ambulatory Visit: Payer: Self-pay | Admitting: Family Medicine

## 2013-11-21 MED ORDER — NAPROXEN 500 MG PO TABS: 500.0000 mg | ORAL_TABLET | Freq: Two times a day (BID) | ORAL | Status: DC

## 2013-11-21 NOTE — Telephone Encounter (Signed)
Received fax request for naproxyn refill; see recent telephone note. Comfortable refilling naproxyn, but pt will need to f/u for further or different refills for pain. Rx sent electronically. --CMS

## 2014-01-02 ENCOUNTER — Other Ambulatory Visit: Payer: Self-pay | Admitting: Family Medicine

## 2014-01-17 ENCOUNTER — Encounter (HOSPITAL_COMMUNITY): Payer: Self-pay | Admitting: Emergency Medicine

## 2014-01-17 ENCOUNTER — Emergency Department (HOSPITAL_COMMUNITY): Payer: Medicare Other

## 2014-01-17 ENCOUNTER — Emergency Department (HOSPITAL_COMMUNITY)
Admission: EM | Admit: 2014-01-17 | Discharge: 2014-01-17 | Disposition: A | Discharge status: Home / Self Care | Payer: Medicare Other | Attending: Emergency Medicine | Admitting: Emergency Medicine

## 2014-01-17 DIAGNOSIS — M1990 Unspecified osteoarthritis, unspecified site: Secondary | ICD-10-CM | POA: Insufficient documentation

## 2014-01-17 DIAGNOSIS — Z792 Long term (current) use of antibiotics: Secondary | ICD-10-CM | POA: Insufficient documentation

## 2014-01-17 DIAGNOSIS — Z87442 Personal history of urinary calculi: Secondary | ICD-10-CM | POA: Insufficient documentation

## 2014-01-17 DIAGNOSIS — Y9389 Activity, other specified: Secondary | ICD-10-CM | POA: Insufficient documentation

## 2014-01-17 DIAGNOSIS — Z791 Long term (current) use of non-steroidal anti-inflammatories (NSAID): Secondary | ICD-10-CM | POA: Insufficient documentation

## 2014-01-17 DIAGNOSIS — Z8719 Personal history of other diseases of the digestive system: Secondary | ICD-10-CM | POA: Insufficient documentation

## 2014-01-17 DIAGNOSIS — I10 Essential (primary) hypertension: Secondary | ICD-10-CM | POA: Diagnosis not present

## 2014-01-17 DIAGNOSIS — M25552 Pain in left hip: Secondary | ICD-10-CM

## 2014-01-17 DIAGNOSIS — J45909 Unspecified asthma, uncomplicated: Secondary | ICD-10-CM | POA: Diagnosis not present

## 2014-01-17 DIAGNOSIS — Z8781 Personal history of (healed) traumatic fracture: Secondary | ICD-10-CM | POA: Diagnosis not present

## 2014-01-17 DIAGNOSIS — W010XXA Fall on same level from slipping, tripping and stumbling without subsequent striking against object, initial encounter: Secondary | ICD-10-CM | POA: Diagnosis not present

## 2014-01-17 DIAGNOSIS — S79912A Unspecified injury of left hip, initial encounter: Secondary | ICD-10-CM | POA: Insufficient documentation

## 2014-01-17 DIAGNOSIS — Y92190 Kitchen in other specified residential institution as the place of occurrence of the external cause: Secondary | ICD-10-CM | POA: Diagnosis not present

## 2014-01-17 DIAGNOSIS — F329 Major depressive disorder, single episode, unspecified: Secondary | ICD-10-CM | POA: Insufficient documentation

## 2014-01-17 DIAGNOSIS — Z720 Tobacco use: Secondary | ICD-10-CM | POA: Diagnosis not present

## 2014-01-17 DIAGNOSIS — Z8639 Personal history of other endocrine, nutritional and metabolic disease: Secondary | ICD-10-CM | POA: Insufficient documentation

## 2014-01-17 DIAGNOSIS — Z79899 Other long term (current) drug therapy: Secondary | ICD-10-CM | POA: Diagnosis not present

## 2014-01-17 DIAGNOSIS — M1612 Unilateral primary osteoarthritis, left hip: Secondary | ICD-10-CM | POA: Diagnosis not present

## 2014-01-17 MED ORDER — HYDROCODONE-ACETAMINOPHEN 5-325 MG PO TABS
1.0000 | ORAL_TABLET | Freq: Once | ORAL | Status: AC
  Administered 2014-01-17: 1 via ORAL
  Filled 2014-01-17: qty 1

## 2014-01-17 MED ORDER — MELOXICAM 7.5 MG PO TABS: 7.5000 mg | ORAL_TABLET | Freq: Every day | ORAL | Status: DC

## 2014-01-17 NOTE — ED Notes (Signed)
Pt reports falling on Sept 1st landing on left hip. Pt reports she has been seen multiple times for same, but reports 10/10 left hip pain. Pt ambulatory to triage. Pt in NAD.

## 2014-01-17 NOTE — ED Provider Notes (Signed)
Medical screening examination/treatment/procedure(s) were performed by non-physician practitioner and as supervising physician I was immediately available for consultation/collaboration.   EKG Interpretation None        Timia Casselman, DO 01/17/14 2324 

## 2014-01-17 NOTE — Discharge Instructions (Signed)

## 2014-01-17 NOTE — ED Provider Notes (Signed)
CSN: [id number]     Arrival date & time 01/17/14  1904 History  This chart was scribed for Felicie Mornavid Chanay Nugent, NP working with Samuel JesterKathleen McManus, DO by Evon Slackerrance Branch, ED Scribe. This patient was seen in room TR07C/TR07C and the patient's care was started at 8:29 PM.    Chief Complaint  Patient presents with  . Hip Pain   Patient is a [age] y.o. female presenting with hip pain. The history is provided by the patient. No language interpreter was used.  Hip Pain   HPI Comments: Cynthia Hess is a 58 y.o. female who presents to the Emergency Department complaining of left hip pain onset 1 month. She states that her pain is not improving.  She sates she slipped and fell in her kitchen 1 month ago. Denies any new recent falls or injury. She states that walking and lying down worsens her symptoms. She states she has tried aleve, tylenol and a tramadol shot with no relief.   Past Medical History  Diagnosis Date  . Arthritis     in low back  . Hypertension 2009  . Asthma 2009  . Hyperlipidemia 2013  . HERNIATED LUMBOSACRAL DISC 05/02/2008    Qualifier: Diagnosis of  By: Shannon, Armeniahina    . COSTOCHONDRITIS 09/04/2008    Qualifier: Diagnosis of  By: Daphine DeutscherMartin FNP, Zena AmosNykedtra    . NEPHROLITHIASIS 05/21/2007    Annotation: bilateral Qualifier: Diagnosis of  By: Daphine DeutscherMartin FNP, Zena AmosNykedtra    . DENTAL CARIES 02/04/2008    Qualifier: Diagnosis of  By: Daphine DeutscherMartin FNP, Zena AmosNykedtra    . CLOSED FRACTURE OF METATARSAL BONE 12/29/2008    Annotation: minimally displaced fracture through the fifth metatarsal Qualifier: Diagnosis of  By: Daphine DeutscherMartin FNP, Zena AmosNykedtra    . Depression     meds in the past/ denies 06/16/13   Past Surgical History  Procedure Laterality Date  . Hernia repair  1990s    reports total of 3 surgeries, most recently 2007  . Abdominal hysterectomy  1989    still gets pap smears  . Small intestine surgery  1989    for "blockage"  . Bladder repair  1989    ?bladder injury  . Tubal ligation  1979  . Tonsillectomy  1967   . Ventral hernia repair  2007    open w mesh Dr Zachery DakinsWeatherly  . Umbilical hernia repair  2005    open w suture  . Ventral hernia repair N/A 06/24/2013    Procedure: LAPAROSCOPIC VENTRAL WALL HERNIA REPAIR;  Surgeon: Ardeth SportsmanSteven C. Gross, MD;  Location: [facility];  Service: General;  Laterality: N/A;  . Insertion of mesh N/A 06/24/2013    Procedure: INSERTION OF MESH;  Surgeon: Ardeth SportsmanSteven C. Gross, MD;  Location: [facility];  Service: General;  Laterality: N/A;   Family History  Problem Relation Age of Onset  . Asthma Brother   . Diabetes Brother   . Hypertension Brother   . Alzheimer's disease Maternal Grandmother   . Early death Mother     when pt was [age]; poisoned  . Early death Father     when pt was [age], unknown   . Hypertension Brother    History  Substance Use Topics  . Smoking status: Current Every Day Smoker -- 0.15 packs/day for 10 years    Types: Cigarettes  . Smokeless tobacco: Never Used     Comment: cutting back  . Alcohol Use: Yes     Comment: Drinks 2x/month- usually 3-4 beers at a time  OB History   Grav Para Term Preterm Abortions TAB SAB Ect Mult Living   4 3 3  1  1   3      Review of Systems  Musculoskeletal: Positive for arthralgias (left hip pain). Negative for gait problem.  All other systems reviewed and are negative.    Allergies  Latex and Banana  Home Medications   Prior to Admission medications   Medication Sig Start Date End Date Taking? Authorizing Provider  acetaminophen (TYLENOL) 325 MG tablet Take 650 mg by mouth every 6 (six) hours as needed.    Historical Provider, MD  cloNIDine (CATAPRES) 0.1 MG tablet Take 1 tablet (0.1 mg total) by mouth at bedtime. 09/01/13   Stephanie Coup Street, MD  cyclobenzaprine (FLEXERIL) 5 MG tablet Take 1 tablet (5 mg total) by mouth 3 (three) times daily as needed for muscle spasms. 10/26/13   Ozella Rocks, MD  lisinopril (PRINIVIL,ZESTRIL) 40 MG tablet TAKE 1 TABLET BY MOUTH DAILY 01/02/14   Stephanie Coup Street, MD   meloxicam (MOBIC) 7.5 MG tablet Take 1 tablet (7.5 mg total) by mouth daily. 01/17/14   Jimmye Norman, NP  metroNIDAZOLE (FLAGYL) 500 MG tablet Take 1 tablet (500 mg total) by mouth 3 (three) times daily. 09/05/13   Stephanie Coup Street, MD  naproxen (NAPROSYN) 500 MG tablet Take 1 tablet (500 mg total) by mouth 2 (two) times daily with a meal. 11/21/13   Stephanie Coup Street, MD  traMADol (ULTRAM) 50 MG tablet Take 1 tablet (50 mg total) by mouth every 8 (eight) hours as needed. 11/02/13   Jamal Collin, MD   Triage Vitals: BP 157/105  Pulse 82  Temp(Src) 98.3 F (36.8 C) (Oral)  Resp 18  SpO2 100%  Physical Exam  Nursing note and vitals reviewed. Constitutional: She is oriented to person, place, and time. She appears well-developed and well-nourished. No distress.  HENT:  Head: Normocephalic and atraumatic.  Eyes: Conjunctivae and EOM are normal.  Neck: Neck supple. No tracheal deviation present.  Cardiovascular: Normal rate.   Pulmonary/Chest: Effort normal. No respiratory distress.  Musculoskeletal: Normal range of motion. She exhibits tenderness.  Left hip tenderness to palpation and increased tenderness to left hip with ROM  Neurological: She is alert and oriented to person, place, and time.  Skin: Skin is warm and dry.  Psychiatric: She has a normal mood and affect. Her behavior is normal.    ED Course  Procedures (including critical care time) DIAGNOSTIC STUDIES: Oxygen Saturation is 100% on RA, normal by my interpretation.    COORDINATION OF CARE: 8:32 PM-Discussed treatment plan which includes left hip X-ray with pt at bedside and pt agreed to plan.     Labs Review Labs Reviewed - No data to display  Imaging Review Dg Hip Complete Left  01/17/2014   CLINICAL DATA:  Persistent pain in the left hip after a fall about 1 month ago.  EXAM: LEFT HIP - COMPLETE 2+ VIEW  COMPARISON:  None.  FINDINGS: Asymmetric prominent degenerative changes in the left hip. 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 No evidence of acute fracture or dislocation of the left hip. Pelvis appears intact. SI joints and symphysis pubis are nondisplaced.  IMPRESSION: Prominent degenerative changes in the left hip. No acute bony abnormalities.   Electronically Signed   By: Burman Nieves M.D.   On: 01/17/2014 21:08     EKG Interpretation None  Radiology results reviewed. Discussed findings with patient. Will start on mobic for symptom control. Follow up with PCP and/or orthopedics. MDM   Final diagnoses:  Left hip pain       I personally performed the services described in this documentation, which was scribed in my presence. The recorded information has been reviewed and is accurate.      Ardyce Heyer John SmJimmye Normanith, NP 01/17/14 [phone]

## 2014-01-30 ENCOUNTER — Other Ambulatory Visit: Payer: Self-pay | Admitting: Family Medicine

## 2014-01-30 ENCOUNTER — Encounter (HOSPITAL_COMMUNITY): Payer: Self-pay | Admitting: Emergency Medicine

## 2014-02-13 DIAGNOSIS — M25552 Pain in left hip: Secondary | ICD-10-CM | POA: Diagnosis not present

## 2014-03-29 ENCOUNTER — Other Ambulatory Visit: Payer: Self-pay | Admitting: Family Medicine

## 2014-04-14 DIAGNOSIS — H04123 Dry eye syndrome of bilateral lacrimal glands: Secondary | ICD-10-CM | POA: Diagnosis not present

## 2014-04-14 DIAGNOSIS — H2513 Age-related nuclear cataract, bilateral: Secondary | ICD-10-CM | POA: Diagnosis not present

## 2014-04-14 DIAGNOSIS — H524 Presbyopia: Secondary | ICD-10-CM | POA: Diagnosis not present

## 2014-04-14 DIAGNOSIS — H10413 Chronic giant papillary conjunctivitis, bilateral: Secondary | ICD-10-CM | POA: Diagnosis not present

## 2014-06-28 ENCOUNTER — Other Ambulatory Visit: Payer: Self-pay | Admitting: Family Medicine

## 2014-07-24 ENCOUNTER — Ambulatory Visit (INDEPENDENT_AMBULATORY_CARE_PROVIDER_SITE_OTHER): Payer: Medicare Other | Admitting: Family Medicine

## 2014-07-24 ENCOUNTER — Encounter: Payer: Self-pay | Admitting: Family Medicine

## 2014-07-24 VITALS — BP 111/79 | HR 86 | Temp 98.9°F | Ht 62.0 in | Wt 163.0 lb

## 2014-07-24 DIAGNOSIS — M4726 Other spondylosis with radiculopathy, lumbar region: Secondary | ICD-10-CM | POA: Diagnosis present

## 2014-07-24 MED ORDER — DEXAMETHASONE SODIUM PHOSPHATE 10 MG/ML IJ SOLN
10.0000 mg | Freq: Once | INTRAMUSCULAR | Status: AC
  Administered 2014-07-24: 10 mg via INTRAMUSCULAR

## 2014-07-24 MED ORDER — KETOROLAC TROMETHAMINE 60 MG/2ML IM SOLN
60.0000 mg | Freq: Once | INTRAMUSCULAR | Status: AC
  Administered 2014-07-24: 60 mg via INTRAMUSCULAR

## 2014-07-24 MED ORDER — HYDROCODONE-ACETAMINOPHEN 5-325 MG PO TABS: ORAL_TABLET | ORAL | Status: DC

## 2014-07-24 NOTE — Progress Notes (Signed)
   Subjective:    Patient ID: Cynthia Hess, female    DOB: [birth year], 59 y.o.   MRN: [id number]  HPI: Pt presents to clinic for chronic low back pain. This has been present for years, worse especially for the past couple of months. Her pain is described as throbbing, aching pain in her mid low-back, radiating especially into her left leg. She has tried walking, exercises, stretching, etc, without much relief. She has taken Aleve / Rx naproxen, which has not helped. She has taken tramadol, Flexeril, and Mobic in the past for pain. Otherwise she takes lisinopril daily. Her pain is quite severe and interferes with her daily life. She has been seen by [facility] for hip pain, in the past. She had an MRI in 2012 that showed multi-level degenerative arthritic changes from L3-L4 to L5-S1, worse on the left than the right.  Review of Systems: As above. She denies fevers / chills, N/V, abdominal pain, loss of weight, loss of control of bowel / bladder function.      Objective:   Physical Exam BP 111/79 mmHg  Pulse 86  Temp(Src) 98.9 F (37.2 C) (Oral)  Ht 5\' 2"  (1.575 m)  Wt 163 lb (73.936 kg)  BMI 29.81 kg/m2 Gen: well but emotional adult female in NAD; at times very emotional HEENT: [place]/AT, EOMI, PERRLA, MMM, TM's clear bilaterally  Nasal mucosae and posterior oropharynx clear Neck: supple, normal ROM, no lymphadenopathy Cardio: RRR, no murmur appreciated Pulm: CTAB, no wheezes, normal WOB Abd: soft, nontender, BS+ Ext: warm, well-perfused, no LE edema MSK: diffuse low lumbar tenderness over bony prominences of spine and posterior pelvis, ~L4 down into sacrum  Pain / tenderness to palpation slightly worse on the left compared to the right  Sitting straight leg lift test negative for elicited radiculopathy-type symptoms, bilaterally  Increased / elicited symptoms with changes in position for exam Neurovascular: alert, oriented, no gross focal deficit, strength 5/5 in all  extremities  Distal pulses intact / symmetric in all four extremities  Pt able to sit / stand / change positions / ambulate without assistance, but does so slowly due to increased pain     Assessment & Plan:  See problem list note.

## 2014-07-24 NOTE — Assessment & Plan Note (Signed)
A: Known multi-level osteoarthritis last seen on MRI in 2012, with persistent clinical symptoms since that time, without much relief with various OTC and prescription meds as well as home / self-directed exercises. Pt has seen ortho in the past for her hips, but not for her back, but she is interested in speaking to a Careers advisersurgeon. No red flags on exam or in pt-reported hx.  P: Injection of Toradol and Decadron in clinic, today. Defer further imaging at this time in favor of referral to neurosurgery (Dr. Yetta BarreJones if available), per pt choice. Explained that they very likely will recommend PT and probably repeat imaging. Advised continued OTC medication use, though counseled on side effects of steroid injection and continued NSAID use especially with concomitant use of ACE for HTN. Rx for Norco 5-325 mg in the meantime, with strict use instructions provided. F/u PRN, otherwise.

## 2014-07-24 NOTE — Patient Instructions (Signed)
Thank you for coming in, today!  You definitely have arthritis in your back, as was seen on MRI a few years ago. I will refer you to neurosurgery, today, hopefully Dr. Yetta BarreJones across the street.  We will give you two shots today. One is Toradol (a very strong NSAID, like ibuprofen or Aleve). DO NOT take any more Aleve or ibuprofen, today. One is Decadron (a steroid). Both with help with pain and inflammation.  You can take Norco (hydrocodone-acetaminophen) for pain, in the meantime. Start with one HALF of one tablet at bedtime. You can gradually increase this to a whole tablet up to 3 times per day, as needed. This is a strong pain medication, a narcotic, so it may make you very sleepy. It may cause some itching, as well. You can take Benadryl or another antihistamine to help with the itching, but this may make sleepiness worse. DO NOT take Norco and drive within about 6 hours.  Call or come back to see me as you need. Please feel free to call with any questions or concerns at any time, at [phone]. --Dr. Casper HarrisonStreet

## 2014-07-25 ENCOUNTER — Other Ambulatory Visit: Payer: Self-pay | Admitting: Family Medicine

## 2014-08-21 DIAGNOSIS — Z6830 Body mass index (BMI) 30.0-30.9, adult: Secondary | ICD-10-CM | POA: Diagnosis not present

## 2014-08-21 DIAGNOSIS — M545 Low back pain: Secondary | ICD-10-CM | POA: Diagnosis not present

## 2014-08-26 DIAGNOSIS — M545 Low back pain: Secondary | ICD-10-CM | POA: Diagnosis not present

## 2014-08-26 DIAGNOSIS — M9973 Connective tissue and disc stenosis of intervertebral foramina of lumbar region: Secondary | ICD-10-CM | POA: Diagnosis not present

## 2014-08-26 DIAGNOSIS — M47817 Spondylosis without myelopathy or radiculopathy, lumbosacral region: Secondary | ICD-10-CM | POA: Diagnosis not present

## 2014-09-05 ENCOUNTER — Other Ambulatory Visit: Payer: Self-pay | Admitting: Neurological Surgery

## 2014-09-05 DIAGNOSIS — M4317 Spondylolisthesis, lumbosacral region: Secondary | ICD-10-CM | POA: Diagnosis not present

## 2014-09-05 DIAGNOSIS — M545 Low back pain: Secondary | ICD-10-CM | POA: Diagnosis not present

## 2014-09-05 DIAGNOSIS — Z6829 Body mass index (BMI) 29.0-29.9, adult: Secondary | ICD-10-CM | POA: Diagnosis not present

## 2014-09-20 ENCOUNTER — Encounter (HOSPITAL_COMMUNITY): Payer: Self-pay

## 2014-09-20 ENCOUNTER — Ambulatory Visit (HOSPITAL_COMMUNITY)
Admission: RE | Admit: 2014-09-20 | Discharge: 2014-09-20 | Disposition: A | Discharge status: Home / Self Care | Payer: Medicare Other | Source: Ambulatory Visit | Attending: Neurological Surgery | Admitting: Neurological Surgery

## 2014-09-20 ENCOUNTER — Encounter (HOSPITAL_COMMUNITY)
Admission: RE | Admit: 2014-09-20 | Discharge: 2014-09-20 | Disposition: A | Discharge status: Home / Self Care | Payer: Medicare Other | Source: Ambulatory Visit | Attending: Neurological Surgery | Admitting: Neurological Surgery

## 2014-09-20 DIAGNOSIS — Z01818 Encounter for other preprocedural examination: Secondary | ICD-10-CM | POA: Insufficient documentation

## 2014-09-20 DIAGNOSIS — M4310 Spondylolisthesis, site unspecified: Secondary | ICD-10-CM

## 2014-09-20 DIAGNOSIS — Z0183 Encounter for blood typing: Secondary | ICD-10-CM | POA: Diagnosis not present

## 2014-09-20 DIAGNOSIS — Z01810 Encounter for preprocedural cardiovascular examination: Secondary | ICD-10-CM | POA: Diagnosis not present

## 2014-09-20 DIAGNOSIS — Z01812 Encounter for preprocedural laboratory examination: Secondary | ICD-10-CM | POA: Diagnosis not present

## 2014-09-20 LAB — CBC WITH DIFFERENTIAL/PLATELET
BASOS ABS: 0 10*3/uL (ref 0.0–0.1)
Basophils Relative: 0 % (ref 0–1)
EOS PCT: 4 % (ref 0–5)
Eosinophils Absolute: 0.3 10*3/uL (ref 0.0–0.7)
HEMATOCRIT: 37.1 % (ref 36.0–46.0)
Hemoglobin: 13 g/dL (ref 12.0–15.0)
Lymphocytes Relative: 42 % (ref 12–46)
Lymphs Abs: 2.8 10*3/uL (ref 0.7–4.0)
MCH: 34.1 pg — ABNORMAL HIGH (ref 26.0–34.0)
MCHC: 35 g/dL (ref 30.0–36.0)
MCV: 97.4 fL (ref 78.0–100.0)
MONO ABS: 0.4 10*3/uL (ref 0.1–1.0)
Monocytes Relative: 6 % (ref 3–12)
NEUTROS ABS: 3.3 10*3/uL (ref 1.7–7.7)
Neutrophils Relative %: 48 % (ref 43–77)
Platelets: 338 10*3/uL (ref 150–400)
RBC: 3.81 MIL/uL — ABNORMAL LOW (ref 3.87–5.11)
RDW: 12.4 % (ref 11.5–15.5)
WBC: 6.8 10*3/uL (ref 4.0–10.5)

## 2014-09-20 LAB — BASIC METABOLIC PANEL
Anion gap: 9 (ref 5–15)
BUN: 10 mg/dL (ref 6–20)
CO2: 21 mmol/L — ABNORMAL LOW (ref 22–32)
Calcium: 9.2 mg/dL (ref 8.9–10.3)
Chloride: 111 mmol/L (ref 101–111)
Creatinine, Ser: 0.62 mg/dL (ref 0.44–1.00)
GFR calc Af Amer: >60 mL/min (ref >60)
GLUCOSE: 93 mg/dL (ref 65–99)
POTASSIUM: 3.6 mmol/L (ref 3.5–5.1)
Sodium: 141 mmol/L (ref 135–145)

## 2014-09-20 LAB — TYPE AND SCREEN
ABO/RH(D): AB POS
Antibody Screen: NEGATIVE

## 2014-09-20 LAB — PROTIME-INR
INR: 0.99 (ref 0.00–1.49)
Prothrombin Time: 13.3 seconds (ref 11.6–15.2)

## 2014-09-20 LAB — SURGICAL PCR SCREEN
MRSA, PCR: NEGATIVE
Staphylococcus aureus: NEGATIVE

## 2014-09-20 LAB — ABO/RH: ABO/RH(D): AB POS

## 2014-09-20 NOTE — Pre-Procedure Instructions (Signed)
[name]  09/20/2014      [facility] - [place], [place] - [address] [place] [address] Phone: [phone] Fax: [phone]  [facility] [address] [address],  - [address] AT [facility] [address] [place] [address] Phone: [phone] Fax: [phone]    Your procedure is scheduled on Friday, September 29, 2014  Report to [facility] Admitting at 5:30 A.M.  Call this number if you have problems the morning of surgery:  [phone]   Remember:  Do not eat food or drink liquids after midnight Thursday, September 28, 2014  Take these medicines the morning of surgery with A SIP OF WATER: if needed:  HYDROcodone-acetaminophen [facility]) for pain  PAZEO eye drops  Stop taking Aspirin, vitamins, herbal medications and Pseudoeph-Doxylamine-DM-APAP (NYQUIL)   Do not take any NSAIDs ie: Ibuprofen, Advil, Naproxen or any medication containing Aspirins; stop 1 week prior to procedure ( Friday, September 22, 2014).   Do not wear jewelry, make-up or nail polish.  Do not wear lotions, powders, or perfumes.  You may not wear deodorant.  Do not shave 48 hours prior to surgery.    Do not bring valuables to the hospital.  [facility] is not responsible for any belongings or valuables.  Contacts, dentures or bridgework may not be worn into surgery.  Leave your suitcase in the car.  After surgery it may be brought to your room.  For patients admitted to the hospital, discharge time will be determined by your treatment team.  Patients discharged the day of surgery will not be allowed to drive home.   Name and phone number of your driver:    Special instructions:  Special Instructions:Special Instructions: [facility] - Preparing for Surgery  Before surgery, you can play an important role.  Because skin is not sterile, your skin needs to be as free of germs as possible.  You can  reduce the number of germs on you skin by washing with CHG (chlorahexidine gluconate) soap before surgery.  CHG is an antiseptic cleaner which kills germs and bonds with the skin to continue killing germs even after washing.  Please DO NOT use if you have an allergy to CHG or antibacterial soaps.  If your skin becomes reddened/irritated stop using the CHG and inform your nurse when you arrive at Short Stay.  Do not shave (including legs and underarms) for at least 48 hours prior to the first CHG shower.  You may shave your face.  Please follow these instructions carefully:   1.  Shower with CHG Soap the night before surgery and the morning of Surgery.  2.  If you choose to wash your hair, wash your hair first as usual with your normal shampoo.  3.  After you shampoo, rinse your hair and body thoroughly to remove the Shampoo.  4.  Use CHG as you would any other liquid soap.  You can apply chg directly  to the skin and wash gently with scrungie or a clean washcloth.  5.  Apply the CHG Soap to your body ONLY FROM THE NECK DOWN.  Do not use on open wounds or open sores.  Avoid contact with your eyes, ears, mouth and genitals (private parts).  Wash genitals (private parts) with your normal soap.  6.  Wash thoroughly, paying special attention to the area where your surgery will be performed.  7.  Thoroughly rinse your body with warm water from the neck down.  8.  DO NOT shower/wash with your normal soap after using and rinsing off the CHG Soap.  9.  Pat yourself dry with a clean towel.            10.  Wear clean pajamas.            11.  Place clean sheets on your bed the night of your first shower and do not sleep with pets.  Day of Surgery  Do not apply any lotions/deodorants the morning of surgery.  Please wear clean clothes to the hospital/surgery center.  Please read over the following fact sheets that you were given. Pain Booklet, Coughing and Deep Breathing, Blood Transfusion Information,  MRSA Information and Surgical Site Infection Prevention

## 2014-09-28 MED ORDER — DEXAMETHASONE SODIUM PHOSPHATE 10 MG/ML IJ SOLN
10.0000 mg | INTRAMUSCULAR | Status: AC
  Administered 2014-09-29: 10 mg via INTRAVENOUS
  Filled 2014-09-28: qty 1

## 2014-09-28 MED ORDER — CEFAZOLIN SODIUM-DEXTROSE 2-3 GM-% IV SOLR
2.0000 g | INTRAVENOUS | Status: AC
  Administered 2014-09-29: 2 g via INTRAVENOUS
  Filled 2014-09-28: qty 50

## 2014-09-29 ENCOUNTER — Encounter (HOSPITAL_COMMUNITY)
Admission: RE | Disposition: A | Discharge status: Home / Self Care | Payer: Medicare Other | Source: Ambulatory Visit | Attending: Neurological Surgery

## 2014-09-29 ENCOUNTER — Inpatient Hospital Stay (HOSPITAL_COMMUNITY)
Admission: RE | Admit: 2014-09-29 | Discharge: 2014-10-02 | DRG: 460 | Disposition: A | Discharge status: Home / Self Care | Payer: Medicare Other | Source: Ambulatory Visit | Attending: Neurological Surgery | Admitting: Neurological Surgery

## 2014-09-29 ENCOUNTER — Encounter (HOSPITAL_COMMUNITY): Payer: Self-pay | Admitting: Neurological Surgery

## 2014-09-29 ENCOUNTER — Inpatient Hospital Stay (HOSPITAL_COMMUNITY): Payer: Medicare Other | Admitting: Emergency Medicine

## 2014-09-29 ENCOUNTER — Inpatient Hospital Stay (HOSPITAL_COMMUNITY): Payer: Medicare Other | Admitting: Anesthesiology

## 2014-09-29 ENCOUNTER — Inpatient Hospital Stay (HOSPITAL_COMMUNITY): Payer: Medicare Other

## 2014-09-29 DIAGNOSIS — M4326 Fusion of spine, lumbar region: Secondary | ICD-10-CM

## 2014-09-29 DIAGNOSIS — M4316 Spondylolisthesis, lumbar region: Secondary | ICD-10-CM | POA: Diagnosis not present

## 2014-09-29 DIAGNOSIS — Z791 Long term (current) use of non-steroidal anti-inflammatories (NSAID): Secondary | ICD-10-CM

## 2014-09-29 DIAGNOSIS — I10 Essential (primary) hypertension: Secondary | ICD-10-CM | POA: Diagnosis not present

## 2014-09-29 DIAGNOSIS — E785 Hyperlipidemia, unspecified: Secondary | ICD-10-CM | POA: Diagnosis present

## 2014-09-29 DIAGNOSIS — M4317 Spondylolisthesis, lumbosacral region: Secondary | ICD-10-CM | POA: Diagnosis present

## 2014-09-29 DIAGNOSIS — Z79899 Other long term (current) drug therapy: Secondary | ICD-10-CM | POA: Diagnosis not present

## 2014-09-29 DIAGNOSIS — M545 Low back pain: Secondary | ICD-10-CM | POA: Diagnosis not present

## 2014-09-29 DIAGNOSIS — M4806 Spinal stenosis, lumbar region: Secondary | ICD-10-CM | POA: Diagnosis present

## 2014-09-29 DIAGNOSIS — M1990 Unspecified osteoarthritis, unspecified site: Secondary | ICD-10-CM | POA: Diagnosis not present

## 2014-09-29 DIAGNOSIS — M4807 Spinal stenosis, lumbosacral region: Secondary | ICD-10-CM | POA: Diagnosis present

## 2014-09-29 DIAGNOSIS — Z981 Arthrodesis status: Secondary | ICD-10-CM

## 2014-09-29 DIAGNOSIS — Z4889 Encounter for other specified surgical aftercare: Secondary | ICD-10-CM | POA: Diagnosis not present

## 2014-09-29 HISTORY — PX: MAXIMUM ACCESS (MAS)POSTERIOR LUMBAR INTERBODY FUSION (PLIF) 2 LEVEL: SHX6369

## 2014-09-29 HISTORY — DX: Arthrodesis status: Z98.1

## 2014-09-29 SURGERY — FOR MAXIMUM ACCESS (MAS) POSTERIOR LUMBAR INTERBODY FUSION (PLIF) 2 LEVEL
Anesthesia: General | Site: Spine Lumbar

## 2014-09-29 MED ORDER — PHENYLEPHRINE HCL 10 MG/ML IJ SOLN
10.0000 mg | INTRAVENOUS | Status: DC | PRN
  Administered 2014-09-29: 20 ug/min via INTRAVENOUS

## 2014-09-29 MED ORDER — PROMETHAZINE HCL 25 MG/ML IJ SOLN: 6.2500 mg | INTRAMUSCULAR | Status: DC | PRN

## 2014-09-29 MED ORDER — FENTANYL CITRATE (PF) 250 MCG/5ML IJ SOLN
INTRAMUSCULAR | Status: AC
  Filled 2014-09-29: qty 5

## 2014-09-29 MED ORDER — METHOCARBAMOL 1000 MG/10ML IJ SOLN: 500.0000 mg | Freq: Four times a day (QID) | INTRAMUSCULAR | Status: DC | PRN

## 2014-09-29 MED ORDER — HYDROMORPHONE HCL 1 MG/ML IJ SOLN
INTRAMUSCULAR | Status: AC
  Filled 2014-09-29: qty 1

## 2014-09-29 MED ORDER — BUPIVACAINE HCL (PF) 0.25 % IJ SOLN
INTRAMUSCULAR | Status: DC | PRN
  Administered 2014-09-29: 4 mL

## 2014-09-29 MED ORDER — MIDAZOLAM HCL 5 MG/5ML IJ SOLN
INTRAMUSCULAR | Status: DC | PRN
  Administered 2014-09-29: 2 mg via INTRAVENOUS

## 2014-09-29 MED ORDER — PHENYLEPHRINE 40 MCG/ML (10ML) SYRINGE FOR IV PUSH (FOR BLOOD PRESSURE SUPPORT)
PREFILLED_SYRINGE | INTRAVENOUS | Status: AC
  Filled 2014-09-29: qty 10

## 2014-09-29 MED ORDER — PHENOL 1.4 % MT LIQD: 1.0000 | OROMUCOSAL | Status: DC | PRN

## 2014-09-29 MED ORDER — ONDANSETRON HCL 4 MG/2ML IJ SOLN
INTRAMUSCULAR | Status: AC
  Filled 2014-09-29: qty 2

## 2014-09-29 MED ORDER — ACETAMINOPHEN 650 MG RE SUPP: 650.0000 mg | RECTAL | Status: DC | PRN

## 2014-09-29 MED ORDER — METHOCARBAMOL 500 MG PO TABS
500.0000 mg | ORAL_TABLET | Freq: Four times a day (QID) | ORAL | Status: DC | PRN
  Administered 2014-09-29 – 2014-10-02 (×7): 500 mg via ORAL
  Filled 2014-09-29 (×7): qty 1

## 2014-09-29 MED ORDER — STERILE WATER FOR INJECTION IJ SOLN
INTRAMUSCULAR | Status: AC
  Filled 2014-09-29: qty 10

## 2014-09-29 MED ORDER — PROPOFOL 10 MG/ML IV BOLUS
INTRAVENOUS | Status: AC
  Filled 2014-09-29: qty 20

## 2014-09-29 MED ORDER — SODIUM CHLORIDE 0.9 % IV SOLN: 250.0000 mL | INTRAVENOUS | Status: DC

## 2014-09-29 MED ORDER — THROMBIN 5000 UNITS EX SOLR
OROMUCOSAL | Status: DC | PRN
  Administered 2014-09-29: 5 mL via TOPICAL

## 2014-09-29 MED ORDER — SODIUM CHLORIDE 0.9 % IJ SOLN: 3.0000 mL | INTRAMUSCULAR | Status: DC | PRN

## 2014-09-29 MED ORDER — THROMBIN 20000 UNITS EX SOLR
CUTANEOUS | Status: DC | PRN
  Administered 2014-09-29: 20 mL via TOPICAL

## 2014-09-29 MED ORDER — ONDANSETRON HCL 4 MG/2ML IJ SOLN
4.0000 mg | INTRAMUSCULAR | Status: DC | PRN
  Administered 2014-09-29 – 2014-09-30 (×2): 4 mg via INTRAVENOUS
  Filled 2014-09-29 (×2): qty 2

## 2014-09-29 MED ORDER — SODIUM CHLORIDE 0.9 % IJ SOLN
3.0000 mL | Freq: Two times a day (BID) | INTRAMUSCULAR | Status: DC
  Administered 2014-09-29 – 2014-10-01 (×2): 3 mL via INTRAVENOUS

## 2014-09-29 MED ORDER — MENTHOL 3 MG MT LOZG: 1.0000 | LOZENGE | OROMUCOSAL | Status: DC | PRN

## 2014-09-29 MED ORDER — HYDROMORPHONE HCL 1 MG/ML IJ SOLN
0.2500 mg | INTRAMUSCULAR | Status: DC | PRN
  Administered 2014-09-29 (×5): 0.5 mg via INTRAVENOUS

## 2014-09-29 MED ORDER — MORPHINE SULFATE 2 MG/ML IJ SOLN
1.0000 mg | INTRAMUSCULAR | Status: DC | PRN
  Administered 2014-09-29 (×3): 2 mg via INTRAVENOUS
  Filled 2014-09-29 (×4): qty 1

## 2014-09-29 MED ORDER — BACITRACIN 50000 UNITS IM SOLR
INTRAMUSCULAR | Status: DC | PRN
  Administered 2014-09-29: 500 mL

## 2014-09-29 MED ORDER — PROPOFOL 10 MG/ML IV BOLUS
INTRAVENOUS | Status: DC | PRN
  Administered 2014-09-29: 40 mg via INTRAVENOUS
  Administered 2014-09-29: 170 mg via INTRAVENOUS
  Administered 2014-09-29: 30 mg via INTRAVENOUS

## 2014-09-29 MED ORDER — ACETAMINOPHEN 325 MG PO TABS
650.0000 mg | ORAL_TABLET | ORAL | Status: DC | PRN
  Administered 2014-09-30 (×2): 650 mg via ORAL
  Filled 2014-09-29 (×2): qty 2

## 2014-09-29 MED ORDER — 0.9 % SODIUM CHLORIDE (POUR BTL) OPTIME
TOPICAL | Status: DC | PRN
  Administered 2014-09-29: 1000 mL

## 2014-09-29 MED ORDER — OXYCODONE-ACETAMINOPHEN 5-325 MG PO TABS
ORAL_TABLET | ORAL | Status: AC
  Administered 2014-09-29: 2 via ORAL
  Filled 2014-09-29: qty 2

## 2014-09-29 MED ORDER — LIDOCAINE HCL (CARDIAC) 20 MG/ML IV SOLN
INTRAVENOUS | Status: AC
  Filled 2014-09-29: qty 5

## 2014-09-29 MED ORDER — SUCCINYLCHOLINE CHLORIDE 20 MG/ML IJ SOLN
INTRAMUSCULAR | Status: AC
  Filled 2014-09-29: qty 1

## 2014-09-29 MED ORDER — CELECOXIB 200 MG PO CAPS
200.0000 mg | ORAL_CAPSULE | Freq: Two times a day (BID) | ORAL | Status: DC
  Administered 2014-09-29 – 2014-10-02 (×7): 200 mg via ORAL
  Filled 2014-09-29 (×7): qty 1

## 2014-09-29 MED ORDER — WHITE PETROLATUM GEL
Status: AC
  Filled 2014-09-29: qty 1

## 2014-09-29 MED ORDER — MIDAZOLAM HCL 2 MG/2ML IJ SOLN
INTRAMUSCULAR | Status: AC
  Filled 2014-09-29: qty 2

## 2014-09-29 MED ORDER — OLOPATADINE HCL 0.7 % OP SOLN: 1.0000 [drp] | OPHTHALMIC | Status: DC

## 2014-09-29 MED ORDER — POTASSIUM CHLORIDE IN NACL 20-0.9 MEQ/L-% IV SOLN
INTRAVENOUS | Status: DC
  Administered 2014-09-29 – 2014-09-30 (×3): via INTRAVENOUS
  Filled 2014-09-29 (×3): qty 1000

## 2014-09-29 MED ORDER — ARTIFICIAL TEARS OP OINT
TOPICAL_OINTMENT | OPHTHALMIC | Status: AC
  Filled 2014-09-29: qty 3.5

## 2014-09-29 MED ORDER — ARTIFICIAL TEARS OP OINT
TOPICAL_OINTMENT | OPHTHALMIC | Status: DC | PRN
  Administered 2014-09-29: 1 via OPHTHALMIC

## 2014-09-29 MED ORDER — LISINOPRIL 20 MG PO TABS
40.0000 mg | ORAL_TABLET | Freq: Every day | ORAL | Status: DC
  Administered 2014-10-02: 40 mg via ORAL
  Filled 2014-09-29 (×2): qty 2

## 2014-09-29 MED ORDER — EPHEDRINE SULFATE 50 MG/ML IJ SOLN
INTRAMUSCULAR | Status: AC
  Filled 2014-09-29: qty 1

## 2014-09-29 MED ORDER — SUCCINYLCHOLINE CHLORIDE 20 MG/ML IJ SOLN
INTRAMUSCULAR | Status: DC | PRN
  Administered 2014-09-29: 100 mg via INTRAVENOUS

## 2014-09-29 MED ORDER — FENTANYL CITRATE (PF) 100 MCG/2ML IJ SOLN
INTRAMUSCULAR | Status: DC | PRN
  Administered 2014-09-29: 25 ug via INTRAVENOUS
  Administered 2014-09-29 (×5): 50 ug via INTRAVENOUS

## 2014-09-29 MED ORDER — METHOCARBAMOL 500 MG PO TABS
ORAL_TABLET | ORAL | Status: AC
  Administered 2014-09-29: 500 mg via ORAL
  Filled 2014-09-29: qty 1

## 2014-09-29 MED ORDER — ONDANSETRON HCL 4 MG/2ML IJ SOLN
INTRAMUSCULAR | Status: DC | PRN
  Administered 2014-09-29: 4 mg via INTRAVENOUS

## 2014-09-29 MED ORDER — PHENYLEPHRINE HCL 10 MG/ML IJ SOLN
INTRAMUSCULAR | Status: DC | PRN
  Administered 2014-09-29: 80 ug via INTRAVENOUS
  Administered 2014-09-29: 40 ug via INTRAVENOUS
  Administered 2014-09-29 (×3): 80 ug via INTRAVENOUS
  Administered 2014-09-29: 40 ug via INTRAVENOUS

## 2014-09-29 MED ORDER — CEFAZOLIN SODIUM 1-5 GM-% IV SOLN
1.0000 g | Freq: Three times a day (TID) | INTRAVENOUS | Status: AC
  Administered 2014-09-29 (×2): 1 g via INTRAVENOUS
  Filled 2014-09-29 (×4): qty 50

## 2014-09-29 MED ORDER — LACTATED RINGERS IV SOLN
INTRAVENOUS | Status: DC | PRN
  Administered 2014-09-29 (×3): via INTRAVENOUS

## 2014-09-29 MED ORDER — OXYCODONE-ACETAMINOPHEN 5-325 MG PO TABS
1.0000 | ORAL_TABLET | ORAL | Status: DC | PRN
  Administered 2014-09-29 (×3): 2 via ORAL
  Administered 2014-09-30 – 2014-10-02 (×13): 1 via ORAL
  Filled 2014-09-29 (×6): qty 1
  Filled 2014-09-29 (×2): qty 2
  Filled 2014-09-29 (×3): qty 1
  Filled 2014-09-29: qty 2
  Filled 2014-09-29 (×4): qty 1

## 2014-09-29 MED ORDER — ROCURONIUM BROMIDE 50 MG/5ML IV SOLN
INTRAVENOUS | Status: AC
  Filled 2014-09-29: qty 1

## 2014-09-29 MED ORDER — HYDROMORPHONE HCL 1 MG/ML IJ SOLN
INTRAMUSCULAR | Status: AC
  Administered 2014-09-29: 0.5 mg via INTRAVENOUS
  Filled 2014-09-29: qty 2

## 2014-09-29 MED ORDER — LIDOCAINE HCL (CARDIAC) 20 MG/ML IV SOLN
INTRAVENOUS | Status: DC | PRN
  Administered 2014-09-29: 100 mg via INTRAVENOUS

## 2014-09-29 MED FILL — Sodium Chloride IV Soln 0.9%: INTRAVENOUS | Qty: 1000 | Status: AC

## 2014-09-29 MED FILL — Heparin Sodium (Porcine) Inj 1000 Unit/ML: INTRAMUSCULAR | Qty: 30 | Status: AC

## 2014-09-29 SURGICAL SUPPLY — 77 items
APL SKNCLS STERI-STRIP NONHPOA (GAUZE/BANDAGES/DRESSINGS) ×1
BAG DECANTER FOR FLEXI CONT (MISCELLANEOUS) ×2 IMPLANT
BENZOIN TINCTURE PRP APPL 2/3 (GAUZE/BANDAGES/DRESSINGS) ×2 IMPLANT
BIT DRILL PLIF MAS 5.0MM DISP (DRILL) IMPLANT
BLADE CLIPPER SURG (BLADE) IMPLANT
BONE MATRIX OSTEOCEL PRO MED (Bone Implant) ×1 IMPLANT
BONE MATRIX OSTEOCEL PRO SM (Bone Implant) ×2 IMPLANT
BUR MATCHSTICK NEURO 3.0 LAGG (BURR) ×2 IMPLANT
CAGE COROENT LG 10X9X23-12 (Cage) ×2 IMPLANT
CAGE COROENT PLIF 10X28-8 LUMB (Cage) ×2 IMPLANT
CANISTER SUCT 3000ML PPV (MISCELLANEOUS) ×2 IMPLANT
CLIP NEUROVISION LG (CLIP) ×1 IMPLANT
CONT SPEC 4OZ CLIKSEAL STRL BL (MISCELLANEOUS) ×4 IMPLANT
COVER BACK TABLE 24X17X13 BIG (DRAPES) IMPLANT
COVER BACK TABLE 60X90IN (DRAPES) ×2 IMPLANT
DRAPE C-ARM 42X72 X-RAY (DRAPES) ×2 IMPLANT
DRAPE C-ARMOR (DRAPES) ×2 IMPLANT
DRAPE LAPAROTOMY 100X72X124 (DRAPES) ×2 IMPLANT
DRAPE POUCH INSTRU U-SHP 10X18 (DRAPES) ×2 IMPLANT
DRAPE SURG 17X23 STRL (DRAPES) ×2 IMPLANT
DRILL PLIF MAS 5.0MM DISP (DRILL) ×2
DRSG OPSITE 4X5.5 SM (GAUZE/BANDAGES/DRESSINGS) ×3 IMPLANT
DRSG OPSITE POSTOP 4X6 (GAUZE/BANDAGES/DRESSINGS) ×1 IMPLANT
DRSG TELFA 3X8 NADH (GAUZE/BANDAGES/DRESSINGS) IMPLANT
DURAPREP 26ML APPLICATOR (WOUND CARE) ×2 IMPLANT
ELECT CAUTERY BLADE 6.4 (BLADE) ×1 IMPLANT
ELECT REM PT RETURN 9FT ADLT (ELECTROSURGICAL) ×2
ELECTRODE REM PT RTRN 9FT ADLT (ELECTROSURGICAL) ×1 IMPLANT
EVACUATOR 1/8 PVC DRAIN (DRAIN) ×2 IMPLANT
GAUZE SPONGE 4X4 16PLY XRAY LF (GAUZE/BANDAGES/DRESSINGS) IMPLANT
GLOVE BIO SURGEON STRL SZ8 (GLOVE) ×2 IMPLANT
GLOVE BIOGEL PI IND STRL 7.5 (GLOVE) IMPLANT
GLOVE BIOGEL PI INDICATOR 7.5 (GLOVE) ×1
GLOVE SURG SS PI 7.0 STRL IVOR (GLOVE) ×1 IMPLANT
GLOVE SURG SS PI 7.5 STRL IVOR (GLOVE) ×2 IMPLANT
GLOVE SURG SS PI 8.0 STRL IVOR (GLOVE) ×1 IMPLANT
GOWN STRL REUS W/ TWL LRG LVL3 (GOWN DISPOSABLE) IMPLANT
GOWN STRL REUS W/ TWL XL LVL3 (GOWN DISPOSABLE) ×2 IMPLANT
GOWN STRL REUS W/TWL 2XL LVL3 (GOWN DISPOSABLE) IMPLANT
GOWN STRL REUS W/TWL LRG LVL3 (GOWN DISPOSABLE) ×2
GOWN STRL REUS W/TWL XL LVL3 (GOWN DISPOSABLE) ×4
HEMOSTAT POWDER KIT SURGIFOAM (HEMOSTASIS) ×1 IMPLANT
KIT BASIN OR (CUSTOM PROCEDURE TRAY) ×2 IMPLANT
KIT NDL NVM5 EMG ELECT (KITS) IMPLANT
KIT NEEDLE NVM5 EMG ELECT (KITS) ×1 IMPLANT
KIT NEEDLE NVM5 EMG ELECTRODE (KITS) ×1
KIT ROOM TURNOVER OR (KITS) ×2 IMPLANT
MILL MEDIUM DISP (BLADE) ×1 IMPLANT
NDL HYPO 25X1 1.5 SAFETY (NEEDLE) ×1 IMPLANT
NEEDLE HYPO 25X1 1.5 SAFETY (NEEDLE) ×2 IMPLANT
NS IRRIG 1000ML POUR BTL (IV SOLUTION) ×2 IMPLANT
PACK LAMINECTOMY NEURO (CUSTOM PROCEDURE TRAY) ×2 IMPLANT
PAD ARMBOARD 7.5X6 YLW CONV (MISCELLANEOUS) ×8 IMPLANT
PAD DRESSING TELFA 3X8 NADH (GAUZE/BANDAGES/DRESSINGS) ×1 IMPLANT
PENCIL BUTTON HOLSTER BLD 10FT (ELECTRODE) ×1 IMPLANT
ROD 50MM LUMBAR (Rod) ×2 IMPLANT
SCREW LOCK (Screw) ×12 IMPLANT
SCREW LOCK FXNS SPNE MAS PL (Screw) IMPLANT
SCREW SHANK 5.0X35 (Screw) ×2 IMPLANT
SCREW SHANK 6.5X65 (Screw) ×2 IMPLANT
SCREW TULIP 5.5 (Screw) ×4 IMPLANT
SPONGE LAP 4X18 X RAY DECT (DISPOSABLE) IMPLANT
SPONGE SURGIFOAM ABS GEL 100 (HEMOSTASIS) ×2 IMPLANT
STRIP CLOSURE SKIN 1/2X4 (GAUZE/BANDAGES/DRESSINGS) ×4 IMPLANT
SUT VIC AB 0 CT1 18XCR BRD8 (SUTURE) ×1 IMPLANT
SUT VIC AB 0 CT1 8-18 (SUTURE) ×4
SUT VIC AB 2-0 CP2 18 (SUTURE) ×3 IMPLANT
SUT VIC AB 3-0 SH 8-18 (SUTURE) ×4 IMPLANT
SYR 20ML ECCENTRIC (SYRINGE) ×2 IMPLANT
SYR 3ML LL SCALE MARK (SYRINGE) IMPLANT
TAPE STRIPS DRAPE STRL (GAUZE/BANDAGES/DRESSINGS) ×1 IMPLANT
TOWEL OR 17X24 6PK STRL BLUE (TOWEL DISPOSABLE) ×2 IMPLANT
TOWEL OR 17X26 10 PK STRL BLUE (TOWEL DISPOSABLE) ×2 IMPLANT
TRAP SPECIMEN MUCOUS 40CC (MISCELLANEOUS) ×1 IMPLANT
TRAY FOLEY BAG SILVER LF 14FR (CATHETERS) ×1 IMPLANT
TRAY FOLEY W/METER SILVER 14FR (SET/KITS/TRAYS/PACK) ×1 IMPLANT
WATER STERILE IRR 1000ML POUR (IV SOLUTION) ×2 IMPLANT

## 2014-09-29 NOTE — Anesthesia Postprocedure Evaluation (Signed)
Anesthesia Post Note  Patient: Cynthia MoseRhonda Y Hess  Procedure(s) Performed: Procedure(s) (LRB): FOR MAXIMUM ACCESS (MAS) POSTERIOR LUMBAR INTERBODY FUSION  LUMBAR FOUR-FIVE,LUMBAR FIVE-SACRAL ONE (N/A)  Anesthesia type: general  Patient location: PACU  Post pain: Pain level controlled  Post assessment: Patient's Cardiovascular Status Stable  Last Vitals:  Filed Vitals:   09/29/14 1310  BP: 107/72  Pulse: 64  Temp: 36.4 C  Resp: 20    Post vital signs: Reviewed and stable  Level of consciousness: sedated  Complications: No apparent anesthesia complications

## 2014-09-29 NOTE — Transfer of Care (Signed)
Immediate Anesthesia Transfer of Care Note  Patient: Cynthia MoseRhonda Y Hess  Procedure(s) Performed: Procedure(s): FOR MAXIMUM ACCESS (MAS) POSTERIOR LUMBAR INTERBODY FUSION  LUMBAR FOUR-FIVE,LUMBAR FIVE-SACRAL ONE (N/A)  Patient Location: PACU  Anesthesia Type:General  Level of Consciousness: awake, alert  and oriented  Airway & Oxygen Therapy: Patient Spontanous Breathing and Patient connected to nasal cannula oxygen  Post-op Assessment: Report given to RN, Post -op Vital signs reviewed and stable and Patient moving all extremities X 4  Post vital signs: Reviewed and stable  Last Vitals:  Filed Vitals:   09/29/14 0550  BP: 132/69  Pulse: 80  Temp: 37 C  Resp: 20    Complications: No apparent anesthesia complications

## 2014-09-29 NOTE — Progress Notes (Signed)
Pt is admitted to 4N17 from PACU. Admission Vital sign is stable.

## 2014-09-29 NOTE — Op Note (Signed)
09/29/2014  11:49 AM  PATIENT:  Cynthia Hess  59 y.o. female  PRE-OPERATIVE DIAGNOSIS:  Degenerative spondylolisthesis L4-5 and L5-S1 with spinal stenosis, back and leg pain  POST-OPERATIVE DIAGNOSIS:  Same  PROCEDURE:   1. Decompressive lumbar laminectomy L4-5 L5-S1 requiring more work than would be required for a simple exposure of the disk for PLIF in order to adequately decompress the neural elements and address the spinal stenosis 2. Posterior lumbar interbody fusion L4-5 L5-S1 using PEEK interbody cages packed with morcellized allograft and autograft 3. Posterior fixation L4-5 L5-S1 using cortical pedicle screws.    SURGEON:  Marikay Alaravid Freyja Govea, MD  ASSISTANTS: Dr. Jordan LikesPool  ANESTHESIA:  General  EBL: 250 ml  Total I/O In: 2100 [I.V.:2100] Out: 550 [Urine:300; Blood:250]  BLOOD ADMINISTERED:none  DRAINS: Hemovac   INDICATION FOR PROCEDURE: This patient presented with a long history of back and leg pain. MRI and plain films shows spondylolisthesis at L4-5 and L5-S1 with spinal stenosis. She tried medical management without relief. Recommended decompression and instrumented fusion to address her spinal stenosis and segmental instability. Her pain was debilitating. Patient understood the risks, benefits, and alternatives and potential outcomes and wished to proceed.  PROCEDURE DETAILS:  The patient was brought to the operating room. After induction of generalized endotracheal anesthesia the patient was rolled into the prone position on chest rolls and all pressure points were padded. The patient's lumbar region was cleaned and then prepped with DuraPrep and draped in the usual sterile fashion. Anesthesia was injected and then a dorsal midline incision was made and carried down to the lumbosacral fascia. The fascia was opened and the paraspinous musculature was taken down in a subperiosteal fashion to expose L4-5 and L5-S1. A self-retaining retractor was placed. Intraoperative fluoroscopy  confirmed my level, and I started with placement of the L4 cortical pedicle screws. The pedicle screw entry zones were identified utilizing surface landmarks and  AP and lateral fluoroscopy. I scored the cortex with the high-speed drill and then used the hand drill and EMG monitoring to drill an upward and outward direction into the pedicle. I then tapped line to line, and the tap was also monitored. I then placed a 5-0 x 35 mm cortical pedicle screw into the pedicles of L4 bilaterally. I then turned my attention to the decompression and the spinous process was removed and complete lumbar laminectomies, hemi- facetectomies, and foraminotomies were performed at L4-5 and L5-S1. The patient had significant spinal stenosis and this required more work than would be required for a simple exposure of the disc for posterior lumbar interbody fusion. Much more generous decompression was undertaken in order to adequately decompress the neural elements and address the patient's leg pain. The yellow ligament was removed to expose the underlying dura and nerve roots, and generous foraminotomies were performed to adequately decompress the neural elements. Both the exiting and traversing nerve roots were decompressed on both sides until a coronary dilator passed easily along the nerve roots. Once the decompression was complete, I turned my attention to the posterior lower lumbar interbody fusion. The epidural venous vasculature was coagulated and cut sharply. Disc space was incised and the initial discectomy was performed with pituitary rongeurs. The disc space was distracted with sequential distractors to a height of 10 mm. We then used a series of scrapers and shavers to prepare the endplates for fusion. The midline was prepared with Epstein curettes. Once the complete discectomy was finished, we packed an appropriate sized peek interbody cage with local autograft  and morcellized allograft, gently retracted the nerve root, and  tapped the cage into position at L4-5 and L5-S1.  The midline between the cages was packed with morselized autograft and allograft. We then turned our attention to the placement of the lower pedicle screws. The pedicle screw entry zones were identified utilizing surface landmarks and fluoroscopy. I drilled into each pedicle utilizing the hand drill and EMG monitoring, and tapped each pedicle with the appropriate tap. We palpated with a ball probe to assure no break in the cortex. We then placed 5-0 x 35 mm cortical pedicle screws in L5 into the pedicles bilaterally at and 6-5 by 35mm pedicle screws into S1. We then placed lordotic rods into the multiaxial screw heads of the pedicle screws and locked these in position with the locking caps and anti-torque device. We then checked our construct with AP and lateral fluoroscopy. Irrigated with copious amounts of bacitracin-containing saline solution. Placed a medium Hemovac drain through separate stab incision. Inspected the nerve roots once again to assure adequate decompression, lined to the dura with Gelfoam, and closed the muscle and the fascia with 0 Vicryl. Closed the subcutaneous tissues with 2-0 Vicryl and subcuticular tissues with 3-0 Vicryl. The skin was closed with benzoin and Steri-Strips. Dressing was then applied, the patient was awakened from general anesthesia and transported to the recovery room in stable condition. At the end of the procedure all sponge, needle and instrument counts were correct.   PLAN OF CARE: Admit to inpatient   PATIENT DISPOSITION:  PACU - hemodynamically stable.   Delay start of Pharmacological VTE agent (>24hrs) due to surgical blood loss or risk of bleeding:  yes

## 2014-09-29 NOTE — H&P (Signed)
Subjective: Patient is a 59 y.o. female admitted for PLIF. Onset of symptoms was a few years ago, worse since that time.  The pain is rated severe, and is located at the low back and radiates to legs. The pain is described as aching and occurs all day. The symptoms have been progressive. Symptoms are exacerbated by activity. MRI or CT showed spondylolisthesis/ stenosis.   Past Medical History  Diagnosis Date  . Arthritis     in low back  . Hypertension 2009  . Asthma 2009  . Hyperlipidemia 2013  . HERNIATED LUMBOSACRAL DISC 05/02/2008    Qualifier: Diagnosis of  By: Shannon, [place]    . COSTOCHONDRITIS 09/04/2008    Qualifier: Diagnosis of  By: Daphine Deutscher FNP, Zena Amos    . NEPHROLITHIASIS 05/21/2007    Annotation: bilateral Qualifier: Diagnosis of  By: Daphine Deutscher FNP, Zena Amos    . DENTAL CARIES 02/04/2008    Qualifier: Diagnosis of  By: Daphine Deutscher FNP, Zena Amos    . CLOSED FRACTURE OF METATARSAL BONE 12/29/2008    Annotation: minimally displaced fracture through the fifth metatarsal Qualifier: Diagnosis of  By: Daphine Deutscher FNP, Zena Amos    . Depression     meds in the past/ denies 06/16/13    Past Surgical History  Procedure Laterality Date  . Hernia repair  1990s    reports total of 3 surgeries, most recently 2007  . Abdominal hysterectomy  1989    still gets pap smears  . Small intestine surgery  1989    for "blockage"  . Bladder repair  1989    ?bladder injury  . Tubal ligation  1979  . Tonsillectomy  1967  . Ventral hernia repair  2007    open w mesh Dr Zachery Dakins  . Umbilical hernia repair  2005    open w suture  . Ventral hernia repair N/A 06/24/2013    Procedure: LAPAROSCOPIC VENTRAL WALL HERNIA REPAIR;  Surgeon: Ardeth Sportsman, MD;  Location: [facility];  Service: General;  Laterality: N/A;  . Insertion of mesh N/A 06/24/2013    Procedure: INSERTION OF MESH;  Surgeon: Ardeth Sportsman, MD;  Location: [facility];  Service: General;  Laterality: N/A;  . Multiple tooth extractions    . Tonsillectomy     . Dilation and curettage of uterus      Prior to Admission medications   Medication Sig Start Date End Date Taking? Authorizing Provider  HYDROcodone-acetaminophen (NORCO) 5-325 MG per tablet Start with one HALF tablet at bedtime, and gradually increase to one WHOLE tablet up to three times daily as needed. 07/24/14  Yes Stephanie Coup [address], MD  lisinopril (PRINIVIL,ZESTRIL) 40 MG tablet TAKE 1 TABLET BY MOUTH DAILY 07/25/14  Yes Stephanie Coup Street, MD  naproxen (NAPROSYN) 500 MG tablet TAKE 1 TABLET BY MOUTH TWICE DAILY WITH A MEAL 06/28/14  Yes Stephanie Coup Street, MD  PAZEO 0.7 % SOLN Place 1 drop into both eyes every morning. 08/15/14  Yes Historical Provider, MD  Polyethyl Glycol-Propyl Glycol (SYSTANE OP) Apply 1 drop to eye 4 (four) times daily.   Yes Historical Provider, MD  Pseudoeph-Doxylamine-DM-APAP (NYQUIL PO) Take 2 capsules by mouth at bedtime as needed (sleep).   Yes Historical Provider, MD   Allergies  Allergen Reactions  . Latex Itching and Swelling  . Banana Swelling    History  Substance Use Topics  . Smoking status: Current Every Day Smoker -- 0.25 packs/day for 25 years    Types: Cigarettes  . Smokeless tobacco: Never Used  Comment: cutting back  . Alcohol Use: Yes     Comment: Drinks 2x/month- usually 3-4 beers at a time     Family History  Problem Relation Age of Onset  . Asthma Brother   . Diabetes Brother   . Hypertension Brother   . Alzheimer's disease Maternal Grandmother   . Early death Mother     when pt was [age]; poisoned  . Early death Father     when pt was 89, unknown   . Hypertension Brother      Review of Systems  Positive ROS: neg  All other systems have been reviewed and were otherwise negative with the exception of those mentioned in the HPI and as above.  Objective: Vital signs in last 24 hours: Temp:  [98.6 F (37 C)] 98.6 F (37 C) (07/01 0550) Pulse Rate:  [80] 80 (07/01 0550) Resp:  [20] 20 (07/01 0550) BP: (132)/(69)  132/69 mmHg (07/01 0550) SpO2:  [98 %] 98 % (07/01 0550) Weight:  [162 lb (73.483 kg)] 162 lb (73.483 kg) (07/01 0550)  General Appearance: Alert, cooperative, no distress, appears stated age Head: Normocephalic, without obvious abnormality, atraumatic Eyes: PERRL, conjunctiva/corneas clear, EOM's intact    Neck: Supple, symmetrical, trachea midline Back: Symmetric, no curvature, ROM normal, no CVA tenderness Lungs:  respirations unlabored Heart: Regular rate and rhythm Abdomen: Soft, non-tender Extremities: Extremities normal, atraumatic, no cyanosis or edema Pulses: 2+ and symmetric all extremities Skin: Skin color, texture, turgor normal, no rashes or lesions  NEUROLOGIC:   Mental status: Alert and oriented x4,  no aphasia, good attention span, fund of knowledge, and memory Motor Exam - grossly normal Sensory Exam - grossly normal Reflexes: 1+ Coordination - grossly normal Gait - grossly normal Balance - grossly normal Cranial Nerves: I: smell Not tested  II: visual acuity  OS: nl    OD: nl  II: visual fields Full to confrontation  II: pupils Equal, round, reactive to light  III,VII: ptosis None  III,IV,VI: extraocular muscles  Full ROM  V: mastication Normal  V: facial light touch sensation  Normal  V,VII: corneal reflex  Present  VII: facial muscle function - upper  Normal  VII: facial muscle function - lower Normal  VIII: hearing Not tested  IX: soft palate elevation  Normal  IX,X: gag reflex Present  XI: trapezius strength  5/5  XI: sternocleidomastoid strength 5/5  XI: neck flexion strength  5/5  XII: tongue strength  Normal    Data Review Lab Results  Component Value Date   WBC 6.8 09/20/2014   HGB 13.0 09/20/2014   HCT 37.1 09/20/2014   MCV 97.4 09/20/2014   PLT 338 09/20/2014   Lab Results  Component Value Date   NA 141 09/20/2014   K 3.6 09/20/2014   CL 111 09/20/2014   CO2 21* 09/20/2014   BUN 10 09/20/2014   CREATININE 0.62 09/20/2014    GLUCOSE 93 09/20/2014   Lab Results  Component Value Date   INR 0.99 09/20/2014    Assessment/Plan: Patient admitted for PLIF. Patient has failed a reasonable attempt at conservative therapy.  I explained the condition and procedure to the patient and answered any questions.  Patient wishes to proceed with procedure as planned. Understands risks/ benefits and typical outcomes of procedure.   Jasmeen Fritsch S 09/29/2014 6:05 AM

## 2014-09-29 NOTE — Progress Notes (Signed)
PT Cancellation Note  Patient Details Name: Cynthia MoseRhonda Y Hess MRN: [id number] DOB: 1955/12/17   Cancelled Treatment:    Reason Eval/Treat Not Completed: Fatigue/lethargy limiting ability to participate RN in room with patient at this time. States patient is now asleep. Will follow up as time allows for physical therapy evaluation. Likely tomorrow.  Cynthia Hess, Cynthia Hess 09/29/2014, 3:43 PM Cynthia SpillersLogan Hess Cynthia Hess, [place] [phone]

## 2014-09-29 NOTE — Progress Notes (Signed)
[facility] called charge RN because patient decided to call him to c/o that she is in pain instead of addressing with the attending nurse. Brother demanded Dr. Yetta BarreJones to call him "right now" to discuss her plan of care. Charge RN listened and apologized to him that MD isn't available at the moment and on call MD on the weekend may call him in the AM. He also requested that attending RN to call him for update so that he can direct care since "my sister is quiet and will not let anyone know that she is hurting".   Sim BoastHavy, RN along with Morrie SheldonAshley, RN visited patient and she verbalized "not to mind her brother" at this time and "please don't call him".  Patient appears very pleasant and denied any discomfort at this time. Attending RN aware.   Sim BoastHavy, RN

## 2014-09-29 NOTE — Progress Notes (Signed)
Utilization review completed.  

## 2014-09-29 NOTE — Anesthesia Procedure Notes (Signed)
Procedure Name: Intubation Date/Time: 09/29/2014 7:32 AM Performed by: Rise PatienceBELL, Dahl Higinbotham T Pre-anesthesia Checklist: Patient identified, Emergency Drugs available, Suction available and Patient being monitored Patient Re-evaluated:Patient Re-evaluated prior to inductionOxygen Delivery Method: Circle system utilized Preoxygenation: Pre-oxygenation with 100% oxygen Intubation Type: IV induction Ventilation: Mask ventilation without difficulty Laryngoscope Size: Miller and 2 Grade View: Grade I Tube type: Oral Tube size: 7.5 mm Number of attempts: 1 Airway Equipment and Method: Stylet Placement Confirmation: ETT inserted through vocal cords under direct vision,  positive ETCO2 and breath sounds checked- equal and bilateral Secured at: 22 cm Tube secured with: Tape Dental Injury: Teeth and Oropharynx as per pre-operative assessment

## 2014-09-29 NOTE — Anesthesia Preprocedure Evaluation (Addendum)
Anesthesia Evaluation  Patient identified by MRN, date of birth, ID band Patient awake    Reviewed: Allergy & Precautions, NPO status , Patient's Chart, lab work & pertinent test results  History of Anesthesia Complications Negative for: history of anesthetic complications  Airway Mallampati: I  TM Distance: >3 FB Neck ROM: Full    Dental  (+) Edentulous Upper, Poor Dentition, Dental Advisory Given   Pulmonary asthma , Current Smoker,    Pulmonary exam normal       Cardiovascular hypertension, Normal cardiovascular exam    Neuro/Psych PSYCHIATRIC DISORDERS Depression    GI/Hepatic negative GI ROS, Neg liver ROS,   Endo/Other  negative endocrine ROS  Renal/GU negative Renal ROS     Musculoskeletal   Abdominal   Peds  Hematology negative hematology ROS (+)   Anesthesia Other Findings   Reproductive/Obstetrics                            Anesthesia Physical Anesthesia Plan  ASA: II  Anesthesia Plan: General   Post-op Pain Management:    Induction: Intravenous  Airway Management Planned: Oral ETT  Additional Equipment:   Intra-op Plan:   Post-operative Plan: Extubation in OR  Informed Consent: I have reviewed the patients History and Physical, chart, labs and discussed the procedure including the risks, benefits and alternatives for the proposed anesthesia with the patient or authorized representative who has indicated his/her understanding and acceptance.   Dental advisory given  Plan Discussed with: CRNA, Surgeon and Anesthesiologist  Anesthesia Plan Comments:        Anesthesia Quick Evaluation

## 2014-09-30 NOTE — Progress Notes (Signed)
Filed Vitals:   09/30/14 0112 09/30/14 0217 09/30/14 0528 09/30/14 0958  BP: 92/41 90/41 94/44  112/61  Pulse: 67 67 62 69  Temp: 98.3 F (36.8 C)  98.4 F (36.9 C) 98.1 F (36.7 C)  TempSrc: Oral  Oral Oral  Resp: 20 18 20 18   Height:      Weight:      SpO2: 98% 98% 98% 98%    Patient with significant incisional pain. Moderate drainage into Hemovac, but also some mild amount of bloody drainage from wound. Dressing though remains intact. PT and OT initiated.  Plan: Continue PT and OT. Encouraged mobility and ambulation.  Hewitt ShortsNUDELMAN,ROBERT W, MD 09/30/2014, 10:10 AM

## 2014-09-30 NOTE — Evaluation (Signed)
Physical Therapy Evaluation Patient Details Name: Cynthia Hess MRN: [id number] DOB: July 20, 1955 Today's Date: 09/30/2014   History of Present Illness  58 y.o. s/p FOR MAXIMUM ACCESS (MAS) POSTERIOR LUMBAR INTERBODY FUSION LUMBAR FOUR-FIVE,LUMBAR FIVE-SACRAL ONE.  Clinical Impression  Pt. Presents to PT with a decrease in her usual functional level post operatively.  She will benefit from acute PT to address her functional limitations for DC home at a modified independent level with family support and 24 hour supervision initially.      Follow Up Recommendations No PT follow up;Supervision/Assistance - 24 hour;Supervision - Intermittent (24 hour initially, decreasing to intermittent as pt. progres)    Equipment Recommendations  Rolling walker with 5" wheels    Recommendations for Other Services       Precautions / Restrictions Precautions Precautions: Back;Fall Precaution Booklet Issued: Yes (comment) Precaution Comments: reinforced back precautions and log rolling; discussed with patient and family the need to change positions frequently with limited sitting of 30 minutes at a time  Required Braces or Orthoses: Spinal Brace Spinal Brace: Lumbar corset;Applied in sitting position Restrictions Weight Bearing Restrictions: No      Mobility  Bed Mobility Overal bed mobility: Needs Assistance Bed Mobility: Rolling;Sidelying to Sit Rolling: Min assist Sidelying to sit: Min assist     Sit to sidelying: Supervision General bed mobility comments: min assist for assisting to side lying and up to sitting; vc's for technique  Transfers Overall transfer level: Needs assistance Equipment used: Rolling walker (2 wheeled) Transfers: Sit to/from Stand Sit to Stand: Min assist         General transfer comment: cues for technique and hand placement  Ambulation/Gait Ambulation/Gait assistance: Min assist Ambulation Distance (Feet): 80 Feet Assistive device: Rolling walker (2  wheeled) Gait Pattern/deviations: Step-through pattern;Decreased stride length Gait velocity: decreased   General Gait Details: Pt. needed postural cues for erect stance; min assist needed for stability first time up ambulating  Stairs            Wheelchair Mobility    Modified Rankin (Stroke Patients Only)       Balance Overall balance assessment: Needs assistance Sitting-balance support: No upper extremity supported;Feet supported Sitting balance-Leahy Scale: Good     Standing balance support: Bilateral upper extremity supported;During functional activity Standing balance-Leahy Scale: Poor Standing balance comment: poor due to need for RW support initially in standing                             Pertinent Vitals/Pain Pain Assessment: 0-10 Pain Score: 8  Pain Location: back and left lateral thigh Pain Descriptors / Indicators: Throbbing Pain Intervention(s): Limited activity within patient's tolerance;Monitored during session;Premedicated before session;Repositioned    Home Living Family/patient expects to be discharged to:: Private residence Living Arrangements: Other relatives;Children;Other (Comment) (daughter and grandchildren) Available Help at Discharge: Family;Available 24 hours/day Type of Home: House Home Access: Stairs to enter Entrance Stairs-Rails: None Entrance Stairs-Number of Steps: 2 Home Layout: One level Home Equipment: Cane - single point      Prior Function Level of Independence: Needs assistance   Gait / Transfers Assistance Needed: using cane  ADL's / Homemaking Assistance Needed: assist with cooking/cleaning        Hand Dominance   Dominant Hand: Right    Extremity/Trunk Assessment   Upper Extremity Assessment: Overall WFL for tasks assessed           Lower Extremity Assessment: Overall WFL for tasks assessed  Cervical / Trunk Assessment: Normal  Communication   Communication: No difficulties   Cognition Arousal/Alertness: Awake/alert Behavior During Therapy: WFL for tasks assessed/performed Overall Cognitive Status: Within Functional Limits for tasks assessed                      General Comments      Exercises        Assessment/Plan    PT Assessment Patient needs continued PT services  PT Diagnosis Difficulty walking;Acute pain   PT Problem List Decreased activity tolerance;Decreased balance;Decreased mobility;Decreased knowledge of use of DME;Decreased knowledge of precautions;Pain  PT Treatment Interventions DME instruction;Gait training;Stair training;Functional mobility training;Balance training;Patient/family education   PT Goals (Current goals can be found in the Care Plan section) Acute Rehab PT Goals Patient Stated Goal: home for recovery with family support PT Goal Formulation: With patient Time For Goal Achievement: 10/07/14 Potential to Achieve Goals: Good    Frequency Min 6X/week   Barriers to discharge        Co-evaluation               End of Session Equipment Utilized During Treatment: Back brace;Gait belt Activity Tolerance: Patient tolerated treatment well Patient left: in chair;with call bell/phone within reach;with family/visitor present (multiple family members present) Nurse Communication: Mobility status         Time: [id number] PT Time Calculation (min) (ACUTE ONLY): 31 min   Charges:   PT Evaluation $Initial PT Evaluation Tier I: 1 Procedure PT Treatments $Gait Training: 8-22 mins   PT G CodesFerman Hamming:        Danyla Wattley B 09/30/2014, 11:22 AM Weldon PickingSusan Zebulen Simonis PT Acute Rehab Services [phone] [phone]

## 2014-09-30 NOTE — Evaluation (Addendum)
Occupational Therapy Evaluation Patient Details Name: Cynthia MoseRhonda Y Jefferys MRN: [id number] DOB: 1955-04-25 Today's Date: 09/30/2014    History of Present Illness 59 y.o. s/p FOR MAXIMUM ACCESS (MAS) POSTERIOR LUMBAR INTERBODY FUSION LUMBAR FOUR-FIVE,LUMBAR FIVE-SACRAL ONE.   Clinical Impression   Pt s/p above. Pt independent with BADLs, PTA. Feel pt will benefit from acute OT to increase independence prior to d/c. Plan to practice LB dressing and tub transfer next session and reinforce precautions.    Follow Up Recommendations  No OT follow up;Supervision - Intermittent    Equipment Recommendations  3 in 1 bedside comode    Recommendations for Other Services       Precautions / Restrictions Precautions Precautions: Back;Fall Precaution Booklet Issued: No Precaution Comments: educated on back precautions Required Braces or Orthoses: Spinal Brace Spinal Brace: Lumbar corset;Applied in sitting position Restrictions Weight Bearing Restrictions: No      Mobility Bed Mobility Overal bed mobility: Needs Assistance Bed Mobility: Rolling;Sidelying to Sit;Sit to Sidelying Rolling: Min assist;Supervision Sidelying to sit: Min assist     Sit to sidelying: Supervision General bed mobility comments: verbal and tactile cues. Assist with rolling onto back. Assist with legs with sidelying to sitting transition.  Transfers Overall transfer level: Needs assistance   Transfers: Sit to/from Stand Sit to Stand: Min assist         General transfer comment: cues for technique.    Balance  Balance not formally assessed. No LOB sitting EOB.                                           ADL Overall ADL's : Needs assistance/impaired     Grooming: Set up;Supervision/safety;Sitting           Upper Body Dressing : Minimal assistance;Sitting       Toilet Transfer: Minimal assistance (sit to stand from bed)             General ADL Comments: Pt limited due to  dizziness. Educated on LB ADL technique and explained AE is available if needed. Discussed positioning of pillow in chair and also sitting in recliner at home. Educated on back brace. Discussed incorporating precautions into functional activities.  Recommended not sitting in chair for longer than 45 minutes at a time.     Vision     Perception     Praxis      Pertinent Vitals/Pain Pain Assessment: 0-10 Pain Score: 6  Pain Location: back Pain Descriptors / Indicators: Throbbing Pain Intervention(s): Monitored during session;Repositioned     Hand Dominance Right   Extremity/Trunk Assessment Upper Extremity Assessment Upper Extremity Assessment: Overall WFL for tasks assessed   Lower Extremity Assessment Lower Extremity Assessment: Defer to PT evaluation       Communication Communication Communication: No difficulties   Cognition Arousal/Alertness: Awake/alert Behavior During Therapy: WFL for tasks assessed/performed Overall Cognitive Status: Within Functional Limits for tasks assessed                     General Comments       Exercises       Shoulder Instructions      Home Living Family/patient expects to be discharged to:: Private residence Living Arrangements: Other relatives;Children (grandchildren; daughter) Available Help at Discharge: Family;Available 24 hours/day Type of Home: House Home Access: Stairs to enter [employer] CorporationEntrance Stairs-Number of Steps: 2 Entrance Stairs-Rails: None Home Layout: One  level     Bathroom Shower/Tub: Chief Strategy Officer: Standard     Home Equipment: [employer] - single point          Prior Functioning/Environment Level of Independence: Needs assistance  Gait / Transfers Assistance Needed: using cane ADL's / Homemaking Assistance Needed: assist with cooking/cleaning        OT Diagnosis: Acute pain   OT Problem List: Decreased activity tolerance;Decreased knowledge of use of DME or AE;Decreased  knowledge of precautions;Pain   OT Treatment/Interventions: Self-care/ADL training;DME and/or AE instruction;Therapeutic activities;Balance training;Patient/family education    OT Goals(Current goals can be found in the care plan section) Acute Rehab OT Goals Patient Stated Goal: not stated OT Goal Formulation: With patient Time For Goal Achievement: 10/07/14 Potential to Achieve Goals: Good ADL Goals Pt Will Perform Grooming: with set-up;standing Pt Will Perform Lower Body Bathing: with set-up;sit to/from stand;with supervision Pt Will Perform Lower Body Dressing: with set-up;with supervision;sit to/from stand Pt Will Transfer to Toilet: with supervision;ambulating (3 in 1 over commode) Pt Will Perform Toileting - Clothing Manipulation and hygiene: sit to/from stand;with supervision;with set-up Pt Will Perform Tub/Shower Transfer: Tub transfer;with supervision;ambulating;3 in 1 Additional ADL Goal #1: Pt will independently verbalize 3/3 back precautions and maintain during session. Additional ADL Goal #2: Pt will be supervision level for bed mobility with HOB flat and no rails as precursor for ADLs.  OT Frequency: Min 2X/week   Barriers to D/C:            Co-evaluation              End of Session Equipment Utilized During Treatment: Gait belt;Back brace Nurse Communication: Mobility status;Other (comment) (back bleeding; dizziness)  Activity Tolerance: Other (comment) (dizziness) Patient left: in bed;with call bell/phone within reach;with family/visitor present   Time: 1610-9604 OT Time Calculation (min): 19 min Charges:  OT General Charges $OT Visit: 1 Procedure OT Evaluation $Initial OT Evaluation Tier I: 1 Procedure G-CodesEarlie Raveling OTR/L [id number] 09/30/2014, 9:20 AM

## 2014-10-01 NOTE — Progress Notes (Signed)
Filed Vitals:   09/30/14 2141 10/01/14 0130 10/01/14 0518 10/01/14 0926  BP: 116/60 115/56 118/66 99/61  Pulse: 86 83 92 97  Temp: 99.5 F (37.5 C) 98.5 F (36.9 C) 98.7 F (37.1 C) 98.7 F (37.1 C)  TempSrc: Oral Oral Oral Oral  Resp: 17 19 16 18   Height:      Weight:      SpO2: 99% 94% 95% 97%    Patient sitting up in chair, has been ambulating in the halls, with the staff. Has made substantial progress with PT and OT. Drainage from Hemovac drain has steadily decreased, nursing staff to remove today.  Plan: Encouraged to ambulate at least 4-5 times in the hall. Continue to progress through postoperative recovery.  Hewitt ShortsNUDELMAN,ROBERT W, MD 10/01/2014, 9:48 AM

## 2014-10-01 NOTE — Progress Notes (Signed)
Occupational Therapy Treatment Patient Details Name: Cynthia MoseRhonda Y Heberle MRN: [id number] DOB: [birth year] Today's Date: 10/01/2014    History of present illness 59 y.o. s/p FOR MAXIMUM ACCESS (MAS) POSTERIOR LUMBAR INTERBODY FUSION LUMBAR FOUR-FIVE,LUMBAR FIVE-SACRAL ONE.   OT comments  Patient making good progress towards OT goals, continue plan of care for now. Pt continues to require supervision -> min assist for ADLs and functional mobility. Pt will continue to benefit from acute OT, but no f/u recommendations at this time.    Follow Up Recommendations  No OT follow up;Supervision - Intermittent    Equipment Recommendations  3 in 1 bedside comode;Other (comment) (LH sponge)    Recommendations for Other Services  None at this time  Precautions / Restrictions Precautions Precautions: Back;Fall Precaution Comments: reinforced back precautions and log rolling Required Braces or Orthoses: Spinal Brace Spinal Brace: Lumbar corset;Applied in sitting position Restrictions Weight Bearing Restrictions: No    Mobility Bed Mobility General bed mobility comments: Patient received seated in recliner upon entering/exiting room  Transfers Overall transfer level: Needs assistance Equipment used: Rolling walker (2 wheeled) Transfers: Sit to/from Stand Sit to Stand: Supervision General transfer comment: supervision for safety and cues needed for sequencing, technique, and back precautions     Balance Overall balance assessment: Needs assistance Sitting-balance support: No upper extremity supported;Feet supported Sitting balance-Leahy Scale: Good     Standing balance support: Bilateral upper extremity supported;During functional activity Standing balance-Leahy Scale: Good   ADL Overall ADL's : Needs assistance/impaired General ADL Comments: Pt barely able to cross BLEs, ecnouraged her to work on this in order to increase independence with LB ADLs. Encouraged patient to purchase a LH sponge for  LB bathing. Pt states she will have family present to assist prn post acute d/c. Pt ambulated <> therapy gym for focus on transfer in/out tub/shower using BSC. Pt min assist for this, but states she can direct care prn and appropriately. Educated pt to keep brace on when getting in shower, removing for shower, and donning prior to getting out of shower. If able to see patient again tomorrow, will go over LB ADLs using AE and practice tub/shower transfer using BSC again.      Cognition   Behavior During Therapy: WFL for tasks assessed/performed Overall Cognitive Status: Within Functional Limits for tasks assessed                 Pertinent Vitals/ Pain       Pain Assessment: 0-10 Pain Score: 8  Pain Location: back Pain Descriptors / Indicators: Aching;Grimacing (after OT session) Pain Intervention(s): Monitored during session;Repositioned   Frequency Min 2X/week     Progress Toward Goals  OT Goals(current goals can now befound in the care plan section)  Progress towards OT goals: Progressing toward goals     Plan Discharge plan remains appropriate    End of Session Equipment Utilized During Treatment: Rolling walker;Back brace   Activity Tolerance Patient tolerated treatment well   Patient Left with call bell/phone within reach;with family/visitor present;in chair    Time: 7829-56210847-0910 OT Time Calculation (min): 23 min  Charges: OT General Charges $OT Visit: 1 Procedure OT Treatments $Self Care/Home Management : 23-37 mins  Tremaine Earwood , MS, OTR/L, CLT Pager: [phone]  10/01/2014, 9:25 AM

## 2014-10-01 NOTE — Progress Notes (Signed)
Physical Therapy Treatment Patient Details Name: Cynthia Hess MRN: [id number] DOB: 1955/04/04 Today's Date: 10/01/2014    History of Present Illness 59 y.o. s/p FOR MAXIMUM ACCESS (MAS) POSTERIOR LUMBAR INTERBODY FUSION LUMBAR FOUR-FIVE,LUMBAR FIVE-SACRAL ONE.    PT Comments    Pt progressing well towards all goals. Pt with good demo of safe walker use during ambulation and was able to complete stair negotiation for safe entry into home. Pt with good recall of back precautions as well. Pt safe to d/c home once medically stable.  Follow Up Recommendations  No PT follow up;Supervision/Assistance - 24 hour;Supervision - Intermittent     Equipment Recommendations  Rolling walker with 5" wheels;3in1 (PT)    Recommendations for Other Services       Precautions / Restrictions Precautions Precautions: Back Precaution Booklet Issued: Yes (comment) Precaution Comments: pt able to recall all precautions, v/c's to adhere Required Braces or Orthoses: Spinal Brace Spinal Brace: Lumbar corset;Applied in sitting position (pt educated on how to don brace) Restrictions Weight Bearing Restrictions: No    Mobility  Bed Mobility Overal bed mobility: Needs Assistance Bed Mobility: Rolling;Sidelying to Sit Rolling: Supervision Sidelying to sit: Supervision       General bed mobility comments: max directional v/c's for technique, increased time  Transfers Overall transfer level: Needs assistance Equipment used: Rolling walker (2 wheeled) Transfers: Sit to/from Stand Sit to Stand: Supervision         General transfer comment: v/c's for hand placement  Ambulation/Gait Ambulation/Gait assistance: Min guard Ambulation Distance (Feet): 150 Feet Assistive device: Rolling walker (2 wheeled) Gait Pattern/deviations: Step-through pattern Gait velocity: decreased   General Gait Details: pt with good walker management   Stairs Stairs: Yes Stairs assistance: Min assist Stair  Management: Step to pattern (R HHA) Number of Stairs: 2 (x2 trials) General stair comments: pt with no hand rail at home, educated grandson and patient on how to properly assist pt up/down stairs.pt able to return demo  Wheelchair Mobility    Modified Rankin (Stroke Patients Only)       Balance Overall balance assessment: Needs assistance Sitting-balance support: No upper extremity supported;Feet supported Sitting balance-Leahy Scale: Good     Standing balance support: Bilateral upper extremity supported;During functional activity Standing balance-Leahy Scale: Good                      Cognition Arousal/Alertness: Awake/alert Behavior During Therapy: WFL for tasks assessed/performed Overall Cognitive Status: Within Functional Limits for tasks assessed                      Exercises      General Comments General comments (skin integrity, edema, etc.): pt assisted to bathroom, pt supervision for hygiene, was able to complete while adhereing to precautions      Pertinent Vitals/Pain Pain Assessment: 0-10 Pain Score: 5  Pain Location: back Pain Descriptors / Indicators: Aching Pain Intervention(s): Monitored during session    Home Living                      Prior Function            PT Goals (current goals can now be found in the care plan section) Acute Rehab PT Goals Patient Stated Goal: home Progress towards PT goals: Progressing toward goals    Frequency  Min 5X/week    PT Plan Current plan remains appropriate;Discharge plan needs to be updated    Co-evaluation  End of Session Equipment Utilized During Treatment: Back brace;Gait belt Activity Tolerance: Patient tolerated treatment well Patient left: in chair;with call bell/phone within reach;with family/visitor present     Time: 0750-0818 PT Time Calculation (min) (ACUTE ONLY): 28 min  Charges:  $Gait Training: 8-22 mins $Therapeutic Activity: 8-22  mins                    G Codes:      Marcene BrawnChadwell, Avacyn Kloosterman Marie 10/01/2014, 9:45 AM   Lewis ShockAshly Olivine Hiers, PT, DPT Pager #: [phone] Office #: [phone]

## 2014-10-02 MED ORDER — METHOCARBAMOL 500 MG PO TABS: 500.0000 mg | ORAL_TABLET | Freq: Four times a day (QID) | ORAL | Status: DC | PRN

## 2014-10-02 MED ORDER — OXYCODONE-ACETAMINOPHEN 5-325 MG PO TABS: 1.0000 | ORAL_TABLET | ORAL | Status: DC | PRN

## 2014-10-02 NOTE — Discharge Summary (Signed)
Physician Discharge Summary  Patient ID: Aaron MoseRhonda Y Hopke MRN: [id number] DOB/AGE: [birth year] 59 y.o.  Admit date: 09/29/2014 Discharge date: 10/02/2014  Admission Diagnoses:  Degenerative spondylolisthesis L4-5 and L5-S1 with spinal stenosis, back and leg pain  Discharge Diagnoses:  Degenerative spondylolisthesis L4-5 and L5-S1 with spinal stenosis, back and leg pain Active Problems:   S/P lumbar spinal fusion   Discharged Condition: good  Hospital Course: Patient admitted by Dr. Yetta BarreJones who performed a L4-5 and L5-S1 lumbar decompression and arthrodesis. She has made good progress, and is up and ambulating actively with a rolling walker. Arrangements have been made for her to be provided with a rolling walker and a 3 and 1. Her dressing was removed, and her incision is healing nicely. She has been given instructions regarding wound care and activities following discharge. She is to return in a couple weeks for follow-up with Dr. Yetta BarreJones.  Discharge Exam: Blood pressure 132/69, pulse 92, temperature 98.8 F (37.1 C), temperature source Oral, resp. rate 18, height 5\' 2"  (1.575 m), weight 73.483 kg (162 lb), SpO2 100 %.  Disposition: 01-Home or Self Care     Medication List    TAKE these medications        HYDROcodone-acetaminophen 5-325 MG per tablet  Commonly known as:  NORCO  Start with one HALF tablet at bedtime, and gradually increase to one WHOLE tablet up to three times daily as needed.     lisinopril 40 MG tablet  Commonly known as:  PRINIVIL,ZESTRIL  TAKE 1 TABLET BY MOUTH DAILY     methocarbamol 500 MG tablet  Commonly known as:  ROBAXIN  Take 1 tablet (500 mg total) by mouth every 6 (six) hours as needed for muscle spasms.     naproxen 500 MG tablet  Commonly known as:  NAPROSYN  TAKE 1 TABLET BY MOUTH TWICE DAILY WITH A MEAL     NYQUIL PO  Take 2 capsules by mouth at bedtime as needed (sleep).     oxyCODONE-acetaminophen 5-325 MG per tablet  Commonly known as:   PERCOCET/ROXICET  Take 1-2 tablets by mouth every 4 (four) hours as needed for moderate pain.     PAZEO 0.7 % Soln  Generic drug:  Olopatadine HCl  Place 1 drop into both eyes every morning.     SYSTANE OP  Apply 1 drop to eye 4 (four) times daily.         SignedHewitt Shorts: NUDELMAN,ROBERT W 10/02/2014, 11:26 AM

## 2014-10-02 NOTE — Progress Notes (Signed)
Late Entry-Patient choked on pills given at 1944. Waste noted by Jefferey PicaAngela Rodriguez RN. Pulled more medicine out of pyxis and crushed pills (Percocet and Robaxin)

## 2014-10-02 NOTE — Progress Notes (Signed)
Pt ambulated the length of the hall approximately 5 times today with no difficulty. Family with standby assistance. Demonstrates appropriate donning on and off of  brace and use of walker.

## 2014-10-02 NOTE — Care Management (Signed)
Important Message  Patient Details  Name: Cynthia MoseRhonda Y Hess MRN: [id number] Date of Birth: [birth year]   Medicare Important Message Given:  Yes-second notification given    Bernadette HoitShoffner, [place] Folden Coleman 10/02/2014, 9:56 AM

## 2014-10-03 ENCOUNTER — Encounter (HOSPITAL_COMMUNITY): Payer: Self-pay | Admitting: Neurological Surgery

## 2014-10-07 ENCOUNTER — Other Ambulatory Visit: Payer: Self-pay | Admitting: Family Medicine

## 2014-11-21 DIAGNOSIS — M4317 Spondylolisthesis, lumbosacral region: Secondary | ICD-10-CM | POA: Diagnosis not present

## 2015-03-06 DIAGNOSIS — Z6830 Body mass index (BMI) 30.0-30.9, adult: Secondary | ICD-10-CM | POA: Diagnosis not present

## 2015-03-06 DIAGNOSIS — M4317 Spondylolisthesis, lumbosacral region: Secondary | ICD-10-CM | POA: Diagnosis not present

## 2015-03-06 DIAGNOSIS — M7072 Other bursitis of hip, left hip: Secondary | ICD-10-CM | POA: Diagnosis not present

## 2015-03-06 DIAGNOSIS — M545 Low back pain: Secondary | ICD-10-CM | POA: Diagnosis not present

## 2015-03-06 DIAGNOSIS — I10 Essential (primary) hypertension: Secondary | ICD-10-CM | POA: Diagnosis not present

## 2015-03-21 ENCOUNTER — Encounter: Payer: Self-pay | Admitting: Family Medicine

## 2015-03-21 ENCOUNTER — Ambulatory Visit (INDEPENDENT_AMBULATORY_CARE_PROVIDER_SITE_OTHER): Payer: Medicare Other | Admitting: Family Medicine

## 2015-03-21 VITALS — BP 134/83 | HR 95 | Temp 98.0°F | Ht 62.0 in | Wt 160.0 lb

## 2015-03-21 DIAGNOSIS — E663 Overweight: Secondary | ICD-10-CM | POA: Diagnosis not present

## 2015-03-21 DIAGNOSIS — Z1211 Encounter for screening for malignant neoplasm of colon: Secondary | ICD-10-CM

## 2015-03-21 DIAGNOSIS — Z1239 Encounter for other screening for malignant neoplasm of breast: Secondary | ICD-10-CM

## 2015-03-21 DIAGNOSIS — Z0000 Encounter for general adult medical examination without abnormal findings: Secondary | ICD-10-CM | POA: Diagnosis not present

## 2015-03-21 DIAGNOSIS — I10 Essential (primary) hypertension: Secondary | ICD-10-CM

## 2015-03-21 DIAGNOSIS — M4726 Other spondylosis with radiculopathy, lumbar region: Secondary | ICD-10-CM | POA: Diagnosis present

## 2015-03-21 MED ORDER — FLUTICASONE PROPIONATE 50 MCG/ACT NA SUSP: 2.0000 | Freq: Every day | NASAL | Status: DC

## 2015-03-21 MED ORDER — LORATADINE 10 MG PO TABS: 10.0000 mg | ORAL_TABLET | Freq: Every day | ORAL | Status: DC

## 2015-03-21 NOTE — Patient Instructions (Addendum)
Start taking claritin daily to help with the drainage Use Flonase daily for 1-2 weeks  Things to do to Keep yourself Healthy - Exercise at least 30-45 minutes a day,  3-4 days a week.  - Eat a low-fat diet with lots of fruits and vegetables, up to 7-9 servings per day. - Scale back on the sodas!! - Seatbelts can save your life. Wear them always. - Smoke detectors on every level of your home, check batteries every year. - Eye Doctor - have an eye exam every 1-2 years - Safe sex - if you may be exposed to STDs, use a condom. - Alcohol If you drink, do it moderately,less than 2 drinks per day. - Health Care Power of Attorney.  Choose someone to speak for you if you are not able. - Depression is common in our stressful world.If you're feeling down or losing interest in things you normally enjoy, please come in for a visit. - Violence - If anyone is threatening or hurting you, please call immediately.

## 2015-03-21 NOTE — Progress Notes (Signed)
Patient ID: Cynthia Hess, female   DOB: 16-Mar-1956, 59 y.o.   MRN: [id number] 59 y.o. year old female presents for well woman/preventative visit.   Acute Concerns: Last week she started having nasal drainage, sneezing, and productive cough of yellow phlegm. L ear starting hurting yesterday. Later on she noted scratchy/sore throat that she noted last night . She feels her throat is getting better. She notes post-nasal drip.    Patient recently had a posterior fusion of L4-S1- doing well from this. Notes Cynthia Hess with neurosurgery/spine will be injecting her L hip for concerns   Diet: Drinks 5 sodas per day (regular, not diet).   Exercise: walks daily, approximately 5-10 minutes.   Birth Control: s/p hysterectomy.   Social:  Social History   Social History  . Marital Status: Single    Spouse Name: N/A  . Number of Children: N/A  . Years of Education: N/A   Social History Main Topics  . Smoking status: Current Every Day Smoker -- 0.25 packs/day for 25 years    Types: Cigarettes  . Smokeless tobacco: Never Used     Comment: cutting back  . Alcohol Use: Yes     Comment: Drinks 2x/month- usually 3-4 beers at a time   . Drug Use: No  . Sexual Activity:    Partners: Male    Birth Control/ Protection: Surgical   Other Topics Concern  . None   Social History Narrative   Previous MD was Cynthia Hess, last seen 08/2011      Has notes from Cynthia Hess (chronic pain mgmt)- non-narcotic txt only per most recent note 09/2011 due to UDS + for codeine, butalbital, ethanol      CHRONIC BACK PAIN PATIENT      Is currently unemployed, finished some high school    On disability for her back- had a fall in 2010       Lives with daughter, Cynthia Hess   Uses cigarettes and EtOH, denies drugs      Current meds:   Lisinopril 40 qd   Clonidine 0.1mg  qhs (restarting 08/2013)    Immunization: Immunization History  Administered Date(s) Administered  . Influenza Whole 12/29/2008  . Td  04/22/2007    Cancer Screening:  Pap Smear: 08/2013- negative for intraepithelial changes or malignancy, negative HR HPV  Mammogram: 10/2013 negative   Colonoscopy: Never had    Physical Exam: Blood pressure 134/83, pulse 95, temperature 98 F (36.7 C), temperature source Oral, height 5\' 2"  (1.575 m), weight 160 lb (72.576 kg).  GEN: Pleasant female, NAD HEENT: Normocephalic, PERRL, EOMI, no scleral icterus, bilateral TM pearly grey, nasal septum midline with some clear nasal drainage. MMM,  uvula midline, no anterior or posterior lymphadenopathy, no thyromegaly CARDIAC:RRR, S1 and S2 present, no murmur, no heaves/thrills RESP: CTAB, normal effort ABD: soft, no tenderness, normal bowel sounds EXT: No edema, 2+ radial and DP pulses SKIN: no rash  ASSESSMENT & PLAN: 59 y.o. female presents for annual well woman/preventative exam.  Please see problem specific assessment and plan.   Essential hypertension, benign BP at goal today. - continue lisinopril  - check BMET - check lipid panel > update, ASCVD 10 yr risk is 15.7%, will start patient on Lipitor  Health care maintenance Patient's pap smear is up to date, will repeat in 2018 Patient has the paperwork to have her mammogram done Referral to GI for colonoscopy Lipid panel as above Declined flu vaccine

## 2015-03-22 DIAGNOSIS — Z0000 Encounter for general adult medical examination without abnormal findings: Secondary | ICD-10-CM | POA: Insufficient documentation

## 2015-03-22 LAB — LIPID PANEL
CHOL/HDL RATIO: 4.6 ratio (ref <=5.0)
CHOLESTEROL: 215 mg/dL — AB (ref 125–200)
HDL: 47 mg/dL (ref >=46)
LDL Cholesterol: 100 mg/dL (ref <130)
Triglycerides: 342 mg/dL — ABNORMAL HIGH (ref <150)
VLDL: 68 mg/dL — ABNORMAL HIGH (ref <30)

## 2015-03-22 LAB — BASIC METABOLIC PANEL
BUN: 10 mg/dL (ref 7–25)
CO2: 25 mmol/L (ref 20–31)
Calcium: 9.3 mg/dL (ref 8.6–10.4)
Chloride: 106 mmol/L (ref 98–110)
Creat: 0.46 mg/dL — ABNORMAL LOW (ref 0.50–1.05)
GLUCOSE: 90 mg/dL (ref 65–99)
POTASSIUM: 3.3 mmol/L — AB (ref 3.5–5.3)
SODIUM: 138 mmol/L (ref 135–146)

## 2015-03-22 MED ORDER — ATORVASTATIN CALCIUM 40 MG PO TABS: 40.0000 mg | ORAL_TABLET | Freq: Every day | ORAL | Status: DC

## 2015-03-22 NOTE — Assessment & Plan Note (Addendum)
BP at goal today. - continue lisinopril  - check BMET - check lipid panel > update, ASCVD 10 yr risk is 15.7%, will start patient on Lipitor

## 2015-03-22 NOTE — Assessment & Plan Note (Addendum)
Patient's pap smear is up to date, will repeat in 2018 Patient has the paperwork to have her mammogram done Referral to GI for colonoscopy Lipid panel as above Declined flu vaccine

## 2015-04-26 ENCOUNTER — Other Ambulatory Visit: Payer: Self-pay | Admitting: Family Medicine

## 2015-05-31 ENCOUNTER — Other Ambulatory Visit: Payer: Self-pay | Admitting: *Deleted

## 2015-05-31 MED ORDER — LISINOPRIL 40 MG PO TABS: 40.0000 mg | ORAL_TABLET | Freq: Every day | ORAL | Status: DC

## 2015-06-09 ENCOUNTER — Other Ambulatory Visit: Payer: Self-pay | Admitting: Family Medicine

## 2015-06-11 NOTE — Telephone Encounter (Signed)
Refilled lisinopril.  Thanks, Joanna Puffrystal S. Dorsey, MD [facility] Resident  06/11/2015, 9:49 AM

## 2015-07-06 DIAGNOSIS — M4317 Spondylolisthesis, lumbosacral region: Secondary | ICD-10-CM | POA: Diagnosis not present

## 2015-07-06 DIAGNOSIS — M545 Low back pain: Secondary | ICD-10-CM | POA: Diagnosis not present

## 2015-07-30 DIAGNOSIS — Z6829 Body mass index (BMI) 29.0-29.9, adult: Secondary | ICD-10-CM | POA: Diagnosis not present

## 2015-07-30 DIAGNOSIS — M4317 Spondylolisthesis, lumbosacral region: Secondary | ICD-10-CM | POA: Diagnosis not present

## 2015-07-30 DIAGNOSIS — M545 Low back pain: Secondary | ICD-10-CM | POA: Diagnosis not present

## 2015-07-30 DIAGNOSIS — M7062 Trochanteric bursitis, left hip: Secondary | ICD-10-CM | POA: Diagnosis not present

## 2015-08-17 ENCOUNTER — Ambulatory Visit: Payer: Medicare Other | Admitting: Family Medicine

## 2015-10-08 ENCOUNTER — Ambulatory Visit: Payer: Medicare Other | Admitting: Family Medicine

## 2015-10-15 ENCOUNTER — Encounter: Payer: Medicare Other | Admitting: Family Medicine

## 2015-10-17 NOTE — Progress Notes (Signed)
This encounter was created in error - please disregard.

## 2015-11-19 ENCOUNTER — Other Ambulatory Visit: Payer: Self-pay | Admitting: Neurological Surgery

## 2015-11-19 DIAGNOSIS — M4317 Spondylolisthesis, lumbosacral region: Secondary | ICD-10-CM

## 2015-11-28 ENCOUNTER — Ambulatory Visit
Admission: RE | Admit: 2015-11-28 | Discharge: 2015-11-28 | Disposition: A | Discharge status: Home / Self Care | Payer: Medicare Other | Source: Ambulatory Visit | Attending: Neurological Surgery | Admitting: Neurological Surgery

## 2015-11-28 DIAGNOSIS — M4317 Spondylolisthesis, lumbosacral region: Secondary | ICD-10-CM

## 2015-11-28 DIAGNOSIS — M5126 Other intervertebral disc displacement, lumbar region: Secondary | ICD-10-CM | POA: Diagnosis not present

## 2015-12-04 DIAGNOSIS — I10 Essential (primary) hypertension: Secondary | ICD-10-CM | POA: Diagnosis not present

## 2015-12-04 DIAGNOSIS — Z6830 Body mass index (BMI) 30.0-30.9, adult: Secondary | ICD-10-CM | POA: Diagnosis not present

## 2015-12-04 DIAGNOSIS — M7062 Trochanteric bursitis, left hip: Secondary | ICD-10-CM | POA: Diagnosis not present

## 2015-12-05 DIAGNOSIS — M7062 Trochanteric bursitis, left hip: Secondary | ICD-10-CM | POA: Diagnosis not present

## 2015-12-05 DIAGNOSIS — M1612 Unilateral primary osteoarthritis, left hip: Secondary | ICD-10-CM | POA: Diagnosis not present

## 2015-12-10 DIAGNOSIS — Z6830 Body mass index (BMI) 30.0-30.9, adult: Secondary | ICD-10-CM | POA: Diagnosis not present

## 2015-12-10 DIAGNOSIS — I10 Essential (primary) hypertension: Secondary | ICD-10-CM | POA: Diagnosis not present

## 2015-12-10 DIAGNOSIS — M94252 Chondromalacia, left hip: Secondary | ICD-10-CM | POA: Diagnosis not present

## 2015-12-14 ENCOUNTER — Telehealth: Payer: Self-pay | Admitting: Family Medicine

## 2015-12-14 DIAGNOSIS — M25552 Pain in left hip: Principal | ICD-10-CM

## 2015-12-14 DIAGNOSIS — M25551 Pain in right hip: Secondary | ICD-10-CM

## 2015-12-14 NOTE — Telephone Encounter (Signed)
Pt would like a referral to [facility] for her [facility] pain.  Dr Yetta BarreJones, her back dr, did an MRI last week because the pt was still having back pain after surgery. He foundt she has problems with her hip.  He was going to do the referral but said her PCP has to do it. Please let pt know when the referral has been sent

## 2015-12-18 NOTE — Telephone Encounter (Signed)
Placed referral. Please have the patient ask the back surgeon to send notes over to [facility] (or to us) so that we can provide this information with the referral.  Thanks, Rodrigo Ranrystal Dorsey

## 2015-12-26 DIAGNOSIS — M25552 Pain in left hip: Secondary | ICD-10-CM | POA: Diagnosis not present

## 2016-01-16 DIAGNOSIS — M25552 Pain in left hip: Secondary | ICD-10-CM | POA: Diagnosis not present

## 2016-01-17 IMAGING — RF DG C-ARM 61-120 MIN
1 series · 2 of 2 positions shown · non-contrast
Comparison: MRI scan of August 26, 2014.

FLUOROSCOPY TIME:  55 seconds.

CLINICAL DATA: L4-S1 posterior fusion.

EXAM:
DG C-ARM 61-120 MIN; LUMBAR SPINE - 2-3 VIEW

[Series 1: run · 2 of 2 slices shown]
[im 1/2]
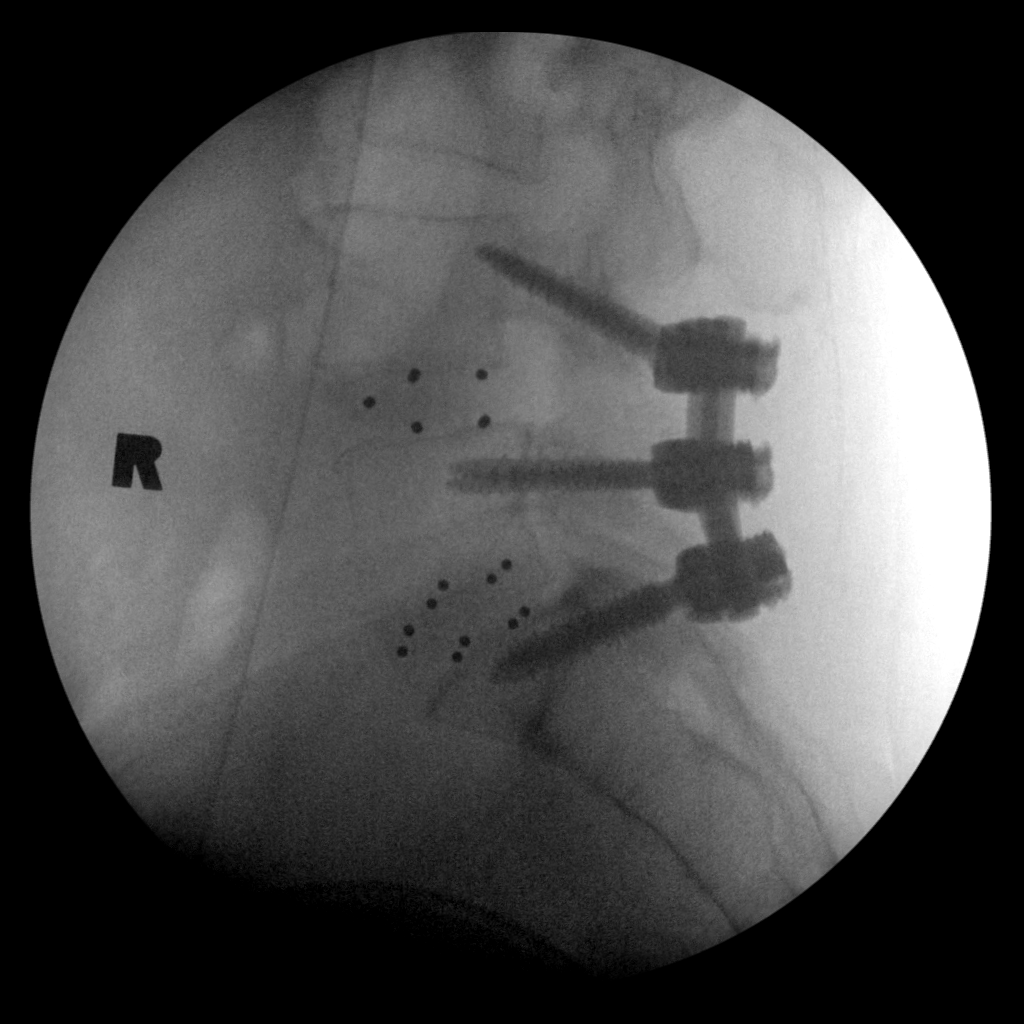
[im 2/2]
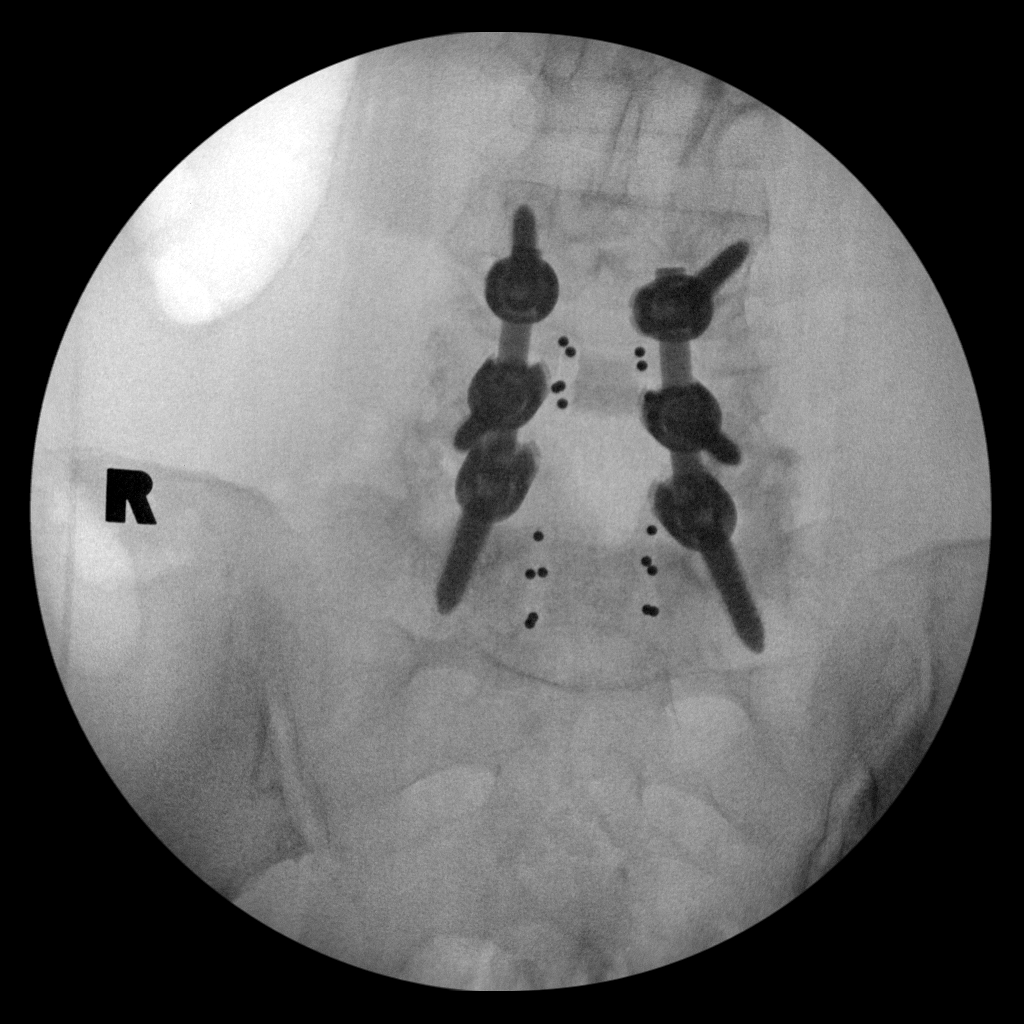

[2 of 2 positions shown; findings below may reference images not displayed]

FINDINGS: Two intraoperative fluoroscopic images were obtained of the lower
lumbar spine. These demonstrate the patient be status post posterior
fusion of L4, L5 and S1 with bilateral intrapedicular screw
placement and interbody fusion. Good alignment of vertebral bodies
is noted.
IMPRESSION: Status post posterior fusion of L4 through S1.

## 2016-01-23 ENCOUNTER — Ambulatory Visit: Payer: Medicare Other | Admitting: Physical Therapy

## 2016-02-09 ENCOUNTER — Other Ambulatory Visit: Payer: Self-pay | Admitting: Family Medicine

## 2016-03-16 ENCOUNTER — Other Ambulatory Visit: Payer: Self-pay | Admitting: Family Medicine

## 2016-03-18 ENCOUNTER — Other Ambulatory Visit: Payer: Self-pay | Admitting: Family Medicine

## 2016-03-18 NOTE — Telephone Encounter (Signed)
Without evaluating the patient it is difficult to know what would be best to call in. Please advise her that she can use cough drops and nasal saline washes for the cough and green mucous. Honey is also good for cough.   It doesn't look like she has a diagnosis of any breathing issues so I will hold off on calling her in any inhalers at this time. If her symptoms are not improving, she really needs to be seen in our clinic (she hasn't been seen in almost a year).  Thanks, Joanna Puffrystal S. Jkayla Spiewak, MD [facility] Resident  03/18/2016, 11:38 AM

## 2016-03-18 NOTE — Telephone Encounter (Signed)
Pt is unable to come in for an appointment, but has been sick for two weeks. Pt would like PCP to call something in for her, pt has a cough, wheezing, and green mucous. Pt uses [facility] on [address]. Please advise. Thanks! ep

## 2016-03-18 NOTE — Telephone Encounter (Signed)
Spoke with patient, she stated that she has been using the honey and lemon as well as theraflu. Symptoms are not improving. Attempted to schedule patient for office visit but PCP schedule for the remainder of December is booked and the January schedule has not yet been opened. Patient stated she would call after the new year and schedule appointment. Maryjean Mornempestt S Roberts, CMA

## 2016-03-27 ENCOUNTER — Other Ambulatory Visit: Payer: Self-pay | Admitting: Family Medicine

## 2016-04-16 ENCOUNTER — Ambulatory Visit: Payer: Medicare Other | Admitting: Family Medicine

## 2016-04-24 ENCOUNTER — Encounter: Payer: Self-pay | Admitting: Internal Medicine

## 2016-04-24 ENCOUNTER — Ambulatory Visit (INDEPENDENT_AMBULATORY_CARE_PROVIDER_SITE_OTHER): Payer: Medicare Other | Admitting: Internal Medicine

## 2016-04-24 VITALS — BP 136/82 | HR 89 | Temp 98.2°F | Wt 168.0 lb

## 2016-04-24 DIAGNOSIS — M25552 Pain in left hip: Secondary | ICD-10-CM

## 2016-04-24 DIAGNOSIS — K430 Incisional hernia with obstruction, without gangrene: Secondary | ICD-10-CM

## 2016-04-24 DIAGNOSIS — E7800 Pure hypercholesterolemia, unspecified: Secondary | ICD-10-CM | POA: Diagnosis not present

## 2016-04-24 DIAGNOSIS — M4726 Other spondylosis with radiculopathy, lumbar region: Secondary | ICD-10-CM | POA: Diagnosis present

## 2016-04-24 DIAGNOSIS — I10 Essential (primary) hypertension: Secondary | ICD-10-CM | POA: Diagnosis not present

## 2016-04-24 DIAGNOSIS — K429 Umbilical hernia without obstruction or gangrene: Secondary | ICD-10-CM | POA: Diagnosis not present

## 2016-04-24 MED ORDER — MELOXICAM 15 MG PO TABS: 15.0000 mg | ORAL_TABLET | Freq: Every day | ORAL | 5 refills | Status: DC

## 2016-04-24 NOTE — Patient Instructions (Addendum)
It was nice meeting you today Ms. Cynthia Hess!  I have placed a referral for you to surgery for your hernia and to orthopedics for your hip pain.   For your hip, you can take one Mobic tablet in the morning. DO NOT take other anti-inflammatory medications while taking Mobic, including ibuprofen, Advil, Aleve, etc. You can take Tylenol with Mobic.   If you begin to have worsening abdominal pain, or have nausea or vomiting, please go to the emergency room.   I will see you back at your earliest convenience for your physical.  If you have any questions or concerns, please feel free to call the clinic.   Be well,  Dr. Natale MilchLancaster

## 2016-04-24 NOTE — Progress Notes (Signed)
   Subjective:   Patient: Cynthia MoseRhonda Y Hess       Birthdate: 01/03/1956       MRN: [id number]      HPI  Cynthia MoseRhonda Y Keown is a 61 y.o. female presenting for abdominal hernia and hip pain.   Abdominal hernia Patient has history of recurrent incisional hernia with incarceration last repaired in 05/2013. She has previously been seen for this problem by [facility]. Patient reports intermittent pain at the site for the past two months, with more constant pain for the past two weeks. Denies severe pain. Denies nausea or vomiting.   Hip pain Patient has history of hip pain for which she has been seen by [facility] before. She says she received some hip injections, but did not like the way she was treated at that practice so did not go back. She describes the pain as worse when laying on the affected side and worse when walking long distances. She describes occasional sharp pain and pain in her foot as well. Patient has had spinal fusion procedures before at which point xrays of her hip were taken which showed OA. She would like to be referred to ortho again for this issue but does not want to go back [facility].   Smoking status reviewed. Patient is current every day smoker.   Review of Systems See HPI.     Objective:  Physical Exam  Constitutional: She is oriented to person, place, and time and well-developed, well-nourished, and in no distress.  HENT:  Head: Normocephalic and atraumatic.  Eyes: Conjunctivae and EOM are normal. Right eye exhibits no discharge. Left eye exhibits no discharge.  Pulmonary/Chest: Effort normal. No respiratory distress.  Abdominal: Soft. Bowel sounds are normal. There is no tenderness.  Large incisional hernia present without tenderness to palpation. Well-healed laparotomy surgical scars present.   Neurological: She is alert and oriented to person, place, and time. Gait normal.  Able to walk, sit, and stand unassisted  Skin: Skin  is warm and dry.  Psychiatric: Affect and judgment normal.    Assessment & Plan:  Recurrent incisional hernia with incarceration s/p lap repair w mesh 06/24/2013 Possible recurrence again as patient with intermittent pain for past 2 months. No concern for incarceration currently as patient without TTP or severe pain. [facility]. Patient given warning signs of incarceration and told to go immediately to ED if signs appear.  - Referral to [facility] placed  Left hip pain Chronic. Likely 2/2 known OA. Patient would like ortho referral for possible injections again. Patient does have occasional sharp pains, though in general pain does seem more consistent with OA pain than with sciatica.  - Referral for [facility] placed - Mobic qd (would like to avoid narcotics)   Tarri AbernethyAbigail J Lawrence Mitch, MD, MPH PGY-2 [facility] Pager [phone]

## 2016-04-24 NOTE — Assessment & Plan Note (Signed)
Possible recurrence again as patient with intermittent pain for past 2 months. No concern for incarceration currently as patient without TTP or severe pain. [facility]. Patient given warning signs of incarceration and told to go immediately to ED if signs appear.  - Referral to [facility] placed

## 2016-04-24 NOTE — Assessment & Plan Note (Signed)
Chronic. Likely 2/2 known OA. Patient would like ortho referral for possible injections again. Patient does have occasional sharp pains, though in general pain does seem more consistent with OA pain than with sciatica.  - [facility] placed - Mobic qd (would like to avoid narcotics)

## 2016-04-25 ENCOUNTER — Other Ambulatory Visit: Payer: Self-pay | Admitting: Internal Medicine

## 2016-04-25 ENCOUNTER — Other Ambulatory Visit: Payer: Self-pay | Admitting: Family Medicine

## 2016-05-19 ENCOUNTER — Ambulatory Visit (INDEPENDENT_AMBULATORY_CARE_PROVIDER_SITE_OTHER): Payer: Medicare Other | Admitting: Internal Medicine

## 2016-05-19 ENCOUNTER — Encounter: Payer: Self-pay | Admitting: Internal Medicine

## 2016-05-19 ENCOUNTER — Other Ambulatory Visit (HOSPITAL_COMMUNITY)
Admission: RE | Admit: 2016-05-19 | Discharge: 2016-05-19 | Disposition: A | Discharge status: Home / Self Care | Payer: Medicare Other | Source: Ambulatory Visit | Attending: Family Medicine | Admitting: Family Medicine

## 2016-05-19 VITALS — BP 145/90 | HR 88 | Temp 98.1°F | Ht 62.0 in | Wt 167.2 lb

## 2016-05-19 DIAGNOSIS — N898 Other specified noninflammatory disorders of vagina: Secondary | ICD-10-CM | POA: Diagnosis not present

## 2016-05-19 DIAGNOSIS — R350 Frequency of micturition: Secondary | ICD-10-CM | POA: Diagnosis not present

## 2016-05-19 DIAGNOSIS — Z1211 Encounter for screening for malignant neoplasm of colon: Secondary | ICD-10-CM | POA: Diagnosis not present

## 2016-05-19 DIAGNOSIS — I10 Essential (primary) hypertension: Secondary | ICD-10-CM | POA: Diagnosis not present

## 2016-05-19 DIAGNOSIS — E669 Obesity, unspecified: Secondary | ICD-10-CM

## 2016-05-19 DIAGNOSIS — Z79899 Other long term (current) drug therapy: Secondary | ICD-10-CM

## 2016-05-19 DIAGNOSIS — E785 Hyperlipidemia, unspecified: Secondary | ICD-10-CM | POA: Diagnosis not present

## 2016-05-19 DIAGNOSIS — Z124 Encounter for screening for malignant neoplasm of cervix: Secondary | ICD-10-CM | POA: Insufficient documentation

## 2016-05-19 DIAGNOSIS — Z0000 Encounter for general adult medical examination without abnormal findings: Secondary | ICD-10-CM | POA: Diagnosis not present

## 2016-05-19 DIAGNOSIS — M4726 Other spondylosis with radiculopathy, lumbar region: Secondary | ICD-10-CM | POA: Diagnosis present

## 2016-05-19 HISTORY — DX: Other specified noninflammatory disorders of vagina: N89.8

## 2016-05-19 LAB — BASIC METABOLIC PANEL WITH GFR
BUN: 18 mg/dL (ref 7–25)
CALCIUM: 9.6 mg/dL (ref 8.6–10.4)
CHLORIDE: 106 mmol/L (ref 98–110)
CO2: 24 mmol/L (ref 20–31)
CREATININE: 0.83 mg/dL (ref 0.50–1.05)
GFR, Est African American: 89 mL/min (ref >=60)
GFR, Est Non African American: 77 mL/min (ref >=60)
Glucose, Bld: 93 mg/dL (ref 65–99)
Potassium: 3.8 mmol/L (ref 3.5–5.3)
Sodium: 141 mmol/L (ref 135–146)

## 2016-05-19 LAB — CHOLESTEROL, TOTAL: Cholesterol: 215 mg/dL — ABNORMAL HIGH (ref <200)

## 2016-05-19 LAB — POCT UA - MICROSCOPIC ONLY

## 2016-05-19 LAB — HDL CHOLESTEROL: HDL: 48 mg/dL — AB (ref >50)

## 2016-05-19 LAB — POCT URINALYSIS DIPSTICK
BILIRUBIN UA: NEGATIVE
GLUCOSE UA: NEGATIVE
KETONES UA: NEGATIVE
Leukocytes, UA: NEGATIVE
Nitrite, UA: NEGATIVE
Protein, UA: NEGATIVE
SPEC GRAV UA: 1.01
UROBILINOGEN UA: 0.2
pH, UA: 6

## 2016-05-19 LAB — POCT GLYCOSYLATED HEMOGLOBIN (HGB A1C): Hemoglobin A1C: 5.3

## 2016-05-19 MED ORDER — FLUTICASONE PROPIONATE 50 MCG/ACT NA SUSP: NASAL | 11 refills | Status: DC

## 2016-05-19 MED ORDER — LISINOPRIL 40 MG PO TABS: 40.0000 mg | ORAL_TABLET | Freq: Every day | ORAL | 3 refills | Status: DC

## 2016-05-19 MED ORDER — ATORVASTATIN CALCIUM 40 MG PO TABS: 40.0000 mg | ORAL_TABLET | Freq: Every day | ORAL | 3 refills | Status: DC

## 2016-05-19 MED ORDER — ESTROGENS, CONJUGATED 0.625 MG/GM VA CREA: 1.0000 | TOPICAL_CREAM | Freq: Every day | VAGINAL | 12 refills | Status: DC

## 2016-05-19 NOTE — Assessment & Plan Note (Addendum)
-   A1C, total cholesterol/HDL (patient not fasting for full lipid panel), BMP today - Refilled atorvastatin and lipitor

## 2016-05-19 NOTE — Assessment & Plan Note (Signed)
With accompanying decreased libido.  - Begin premarin vaginal cream

## 2016-05-19 NOTE — Assessment & Plan Note (Signed)
Suspect 2/2 pressure for abdominal hernia, as sensation much worse when laying on abdomen, or possibly from drinking soda right before bed. Recommended not drinking soda or any other liquids for 1-2 hours prior to bed. Patient also has appt with [facility] in two days to discuss hernia repair. UA with no abnormalities, making infection less likely cause.

## 2016-05-19 NOTE — Progress Notes (Signed)
61 y.o. year old female presents for well woman/preventative visit and annual GYN examination.  Acute Concerns: Increased urinary frequency Worse when laying on stomach. Is currently waking up about 5 times per night to go to the bathroom. Urinates a normal amount, and normally pale yellow to clear. Denies dysuria, hematuria. No vaginal discharge. Patient reports that she drinks soda right up until she goes to bed, and always has a can of soda on her nightstand.   Diet: Eats lots of sweets. Thinks that's why she's gaining weight. Eating 1-2 regular meals per day. Eats lots of fried foods, but says she is "getting ready to try to cut down." Oldest brother just found out that he has diabetes so she is starting to become more concerned about her own health.   Exercise: Walks a lot daily.   Sexual/Birth History: Would like to be sexually active but significantly decreased libido. Endorses vaginal dryness. Cannot say whether or not she has dyspareunia because she has not been sexually active in a while.   Birth Control: Post-menopausal  Past Medical History:  Diagnosis Date  . Arthritis    in low back  . Asthma 2009  . CLOSED FRACTURE OF METATARSAL BONE 12/29/2008   Annotation: minimally displaced fracture through the fifth metatarsal Qualifier: Diagnosis of  By: Daphine Deutscher FNP, Zena Amos    . COSTOCHONDRITIS 09/04/2008   Qualifier: Diagnosis of  By: Daphine Deutscher FNP, Zena Amos    . DENTAL CARIES 02/04/2008   Qualifier: Diagnosis of  By: Daphine Deutscher FNP, Zena Amos    . Depression    meds in the past/ denies 06/16/13  . HERNIATED LUMBOSACRAL DISC 05/02/2008   Qualifier: Diagnosis of  By: Shannon, [place]    . Hyperlipidemia 2013  . Hypertension 2009  . NEPHROLITHIASIS 05/21/2007   Annotation: bilateral Qualifier: Diagnosis of  By: Daphine Deutscher FNP, Zena Amos     Past Surgical History:  Procedure Laterality Date  . ABDOMINAL HYSTERECTOMY  1989   still gets pap smears  . BLADDER REPAIR  1989   ?bladder injury  .  DILATION AND CURETTAGE OF UTERUS    . HERNIA REPAIR  1990s   reports total of 3 surgeries, most recently 2007  . INSERTION OF MESH N/A 06/24/2013   Procedure: INSERTION OF MESH;  Surgeon: Ardeth Sportsman, MD;  Location: [facility];  Service: General;  Laterality: N/A;  . MAXIMUM ACCESS (MAS)POSTERIOR LUMBAR INTERBODY FUSION (PLIF) 2 LEVEL N/A 09/29/2014   Procedure: FOR MAXIMUM ACCESS (MAS) POSTERIOR LUMBAR INTERBODY FUSION  LUMBAR FOUR-FIVE,LUMBAR FIVE-SACRAL ONE;  Surgeon: Tia Alert, MD;  Location: [facility];  Service: Neurosurgery;  Laterality: N/A;  . MULTIPLE TOOTH EXTRACTIONS    . SMALL INTESTINE SURGERY  1989   for "blockage"  . TONSILLECTOMY  1967  . TONSILLECTOMY    . TUBAL LIGATION  1979  . UMBILICAL HERNIA REPAIR  2005   open w suture  . VENTRAL HERNIA REPAIR  2007   open w mesh Dr Zachery Dakins  . VENTRAL HERNIA REPAIR N/A 06/24/2013   Procedure: LAPAROSCOPIC VENTRAL WALL HERNIA REPAIR;  Surgeon: Ardeth Sportsman, MD;  Location: [facility];  Service: General;  Laterality: N/A;     Allergies  Allergen Reactions  . Latex Itching and Swelling  . Banana Swelling    Social:  Social History   Social History  . Marital status: Single    Spouse name: N/A  . Number of children: N/A  . Years of education: N/A   Social History Main Topics  .  Smoking status: Current Every Day Smoker    Packs/day: 0.25    Years: 25.00    Types: Cigarettes  . Smokeless tobacco: Never Used     Comment: cutting back  . Alcohol use Yes     Comment: Drinks 2x/month- usually 3-4 beers at a time   . Drug use: No  . Sexual activity: Not Currently    Partners: Male    Birth control/ protection: Surgical   Other Topics Concern  . Not on file   Social History Narrative   Previous MD was Dr. Philipp DeputyVollmer, last seen 08/2011      Has notes from Dr. Wynn BankerKirsteins (chronic pain mgmt)- non-narcotic txt only per most recent note 09/2011 due to UDS + for codeine, butalbital, ethanol      CHRONIC BACK PAIN PATIENT       Is currently unemployed, finished some high school    On disability for her back- had a fall in 2010       Lives with daughter, Dario GuardianWanda Bottger   Uses cigarettes and EtOH, denies drugs      Current meds:   Lisinopril 40 qd   Clonidine 0.1mg  qhs (restarting 08/2013)    Immunization: Immunization History  Administered Date(s) Administered  . Influenza Whole 12/29/2008  . Td 04/22/2007    Cancer Screening:  Pap Smear: Performed today  Mammogram:  2016  Colonoscopy: Never performed  Physical Exam: VITALS: Reviewed GEN: Pleasant female, NAD HEENT: Normocephalic, PERRL, EOMI, no scleral icterus, bilateral TM pearly grey, nasal septum midline, MMM, uvula midline, no anterior or posterior lymphadenopathy, no thyromegaly CARDIAC:RRR, S1 and S2 present, no murmur, no heaves/thrills RESP: CTAB, normal effort ABD: Soft, no tenderness, normal bowel sounds GU/GYN:Exam performed in the presence of a chaperone. Normal external genitalia. Cervix unremarkable. Bimanual exam identified no masses. Vaginal dryness and atrophy noted.  EXT: No edema, 2+ radial and DP pulses SKIN: No rash  ASSESSMENT & PLAN: 61 y.o. female presents for annual well woman/preventative exam and GYN exam. Please see problem specific assessment and plan.   Vaginal dryness With accompanying decreased libido.  - Begin premarin vaginal cream  Health care maintenance - A1C, total cholesterol/HDL (patient not fasting for full lipid panel), BMP today - Refilled atorvastatin and lipitor  Essential hypertension, benign BP 145/90 today. Patient has not yet taken lisinopril today.  - BMP today - Refilled lisinopril - F/u at next appt  Increased urinary frequency Suspect 2/2 pressure for abdominal hernia, as sensation much worse when laying on abdomen, or possibly from drinking soda right before bed. Recommended not drinking soda or any other liquids for 1-2 hours prior to bed. Patient also has appt [facility] in two days to discuss hernia repair. UA with no abnormalities, making infection less likely cause.   Tarri AbernethyAbigail J Phyllip Claw, MD, MPH PGY-2 [facility] Pager [phone]

## 2016-05-19 NOTE — Patient Instructions (Addendum)
It was nice seeing you again today Ms. Edgren!  I will let you know if there are any abnormalities on your labwork.   If you are still having to go to the bathroom frequently, please schedule another appointment.   Please start taking your Lisinopril (for blood pressure) and Lipitor (for cholesterol) again.   I will see you back in about three months.   If you have any questions or concerns, please feel free to call the clinic.   Be well,  Dr. Natale MilchLancaster

## 2016-05-19 NOTE — Assessment & Plan Note (Signed)
BP 145/90 today. Patient has not yet taken lisinopril today.  - BMP today - Refilled lisinopril - F/u at next appt

## 2016-05-21 LAB — CYTOLOGY - PAP: Diagnosis: NEGATIVE

## 2016-05-23 ENCOUNTER — Telehealth: Payer: Self-pay | Admitting: Internal Medicine

## 2016-05-23 MED ORDER — METRONIDAZOLE 500 MG PO TABS: 2000.0000 mg | ORAL_TABLET | Freq: Once | ORAL | 0 refills | Status: AC

## 2016-05-23 NOTE — Telephone Encounter (Signed)
Called patient to inform her of negative Pap but positive trich. No answer. Will send prescription for metronidazole 2g x1 and attempt to call patient again this afternoon.   Tarri AbernethyAbigail J Errin Chewning, MD, MPH PGY-2 [facility] Pager [phone]

## 2016-05-23 NOTE — Telephone Encounter (Signed)
Spoke with patient regarding positive trich. Discussed treatment plan. Patient voiced understanding. Also informed patient that [facility] has been trying to get in touch with her to schedule appointment but have not been able to get through. Recommended patient call [facility] to try to schedule appointment.   Tarri AbernethyAbigail J Roshaun Pound, MD, MPH PGY-2 [facility] Pager [phone]

## 2016-06-16 ENCOUNTER — Encounter: Payer: Self-pay | Admitting: Internal Medicine

## 2016-10-02 NOTE — Progress Notes (Signed)
Subjective:   Patient ID: Cynthia Hess    DOB: May 04, 1955, 61 y.o. female   MRN: [id number]  CC: "Left wrist pain"  HPI: Cynthia Hess is a 61 y.o. female who presents to clinic today for left wrist pain. Problems discussed today are as follows:  Left wrist pain: Onset 2 weeks ago. Patient endorses history of left bursitis of shoulder 3 years ago with intermittent episodes over the past few years. Patient has been experiencing recurrence of this but mainly this new onset pain and swelling in the left wrist. She does endorse having a new grandchild which she likes to hold but is having more difficulty lately. ROS: Denies other joint swelling or pain, myalgias, fevers or chills.  Hypertension: Well-controlled with long-term use of lisinopril. Patient states she ran out 2 days ago.   Allergic rhinitis: Patient asking for refill for Flonase. States she continually is has congestion at night and has not been taking medication for 3 weeks. Flonase seems to help. ROS: Denies rhinorrhea, cough, sneeze.  Complete ROS performed, see HPI for pertinent.  PMFSH: HTN, OA, lumbar spondylosis with sciatica, tobacco use disorder, obesity, depression. H/o lumbar fusion L4-S1, hernia repair x3 (ventral), surgical ligation after SBO, TAH. Smoking status reviewed. Medications reviewed.  Objective:   BP (!) 158/90   Pulse 70   Temp 98.5 F (36.9 C) (Oral)   Wt 168 lb (76.2 kg)   BMI 30.73 kg/m  Vitals and nursing note reviewed.  General: well nourished, well developed, in no acute distress with non-toxic appearance HEENT: normocephalic, atraumatic, moist mucous membranes Neck: supple, non-tender without lymphadenopathy CV: regular rate and rhythm without murmurs, rubs, or gallops, no lower extremity edema Lungs: clear to auscultation bilaterally with normal work of breathing Skin: warm, dry, no rashes or lesions, cap refill < 2 seconds Extremities: warm and well perfused, normal tone, slight edema  of radial surface of left wrist with moderate tenderness on palpation, positive Finkelstein maneuver left wrist  Assessment & Plan:   Primary hypertension Chronic. Uncontrolled today. BP 158/90. Missed doses for past 2 days due to prescription running out. --Refilled lisinopril 40 mg daily and atorvastatin 40 mg daily  De Quervain's tenosynovitis, left Acute. Affecting the left wrist. Right-handed and not currently working. --Given thumb spica splint and recommended conservative treatment including Tylenol q6h PRN and Mobic 15 mg daily with cold compress --Will obtain TSH, last A1c WNL --Would pursue steroid injection for ambulatory referral to sports med if not improved --RTC 3 months or sooner if needed  Allergic rhinitis Chronic. Controlled on Flonase though prescription ran out. Tends to affect patient during the night with sinus congestion. --Refill provided for Flonase  Orders Placed This Encounter  Procedures  . TSH   Meds ordered this encounter  Medications  . lisinopril (PRINIVIL,ZESTRIL) 40 MG tablet    Sig: Take 1 tablet (40 mg total) by mouth daily.    Dispense:  90 tablet    Refill:  3    **Patient requests 90 days supply**  . atorvastatin (LIPITOR) 40 MG tablet    Sig: Take 1 tablet (40 mg total) by mouth daily.    Dispense:  90 tablet    Refill:  3  . fluticasone (FLONASE) 50 MCG/ACT nasal spray    Sig: SHAKE LIQUID AND USE 2 SPRAYS IN EACH NOSTRIL DAILY    Dispense:  16 g    Refill:  11    **Patient requests 90 days supply**  . meloxicam (MOBIC) 15  MG tablet    Sig: Take 1 tablet (15 mg total) by mouth daily.    Dispense:  30 tablet    Refill:  0  . Elastic Bandages & Supports (WRIST SPLINT LEFT/RIGHT) MISC    Sig: 1 Device by Does not apply route once.    Dispense:  1 each    Refill:  0    Needs 1 thumb spica splint for left wrist.    Durward Parcelavid McMullen, DO [facility], PGY-2 10/03/2016 2:35 PM

## 2016-10-03 ENCOUNTER — Encounter: Payer: Self-pay | Admitting: Family Medicine

## 2016-10-03 ENCOUNTER — Other Ambulatory Visit: Payer: Self-pay | Admitting: Family Medicine

## 2016-10-03 ENCOUNTER — Ambulatory Visit (INDEPENDENT_AMBULATORY_CARE_PROVIDER_SITE_OTHER): Payer: Medicare Other | Admitting: Family Medicine

## 2016-10-03 VITALS — BP 158/90 | HR 70 | Temp 98.5°F | Wt 168.0 lb

## 2016-10-03 DIAGNOSIS — J309 Allergic rhinitis, unspecified: Secondary | ICD-10-CM | POA: Insufficient documentation

## 2016-10-03 DIAGNOSIS — M654 Radial styloid tenosynovitis [de Quervain]: Secondary | ICD-10-CM

## 2016-10-03 DIAGNOSIS — J301 Allergic rhinitis due to pollen: Secondary | ICD-10-CM

## 2016-10-03 DIAGNOSIS — I10 Essential (primary) hypertension: Secondary | ICD-10-CM | POA: Diagnosis not present

## 2016-10-03 HISTORY — DX: Radial styloid tenosynovitis (de quervain): M65.4

## 2016-10-03 HISTORY — DX: Allergic rhinitis, unspecified: J30.9

## 2016-10-03 MED ORDER — MELOXICAM 15 MG PO TABS: 15.0000 mg | ORAL_TABLET | Freq: Every day | ORAL | 0 refills | Status: DC

## 2016-10-03 MED ORDER — LISINOPRIL 40 MG PO TABS: 40.0000 mg | ORAL_TABLET | Freq: Every day | ORAL | 3 refills | Status: DC

## 2016-10-03 MED ORDER — FLUTICASONE PROPIONATE 50 MCG/ACT NA SUSP
NASAL | 11 refills | Status: DC
Start: 2016-10-03 — End: 2017-11-24

## 2016-10-03 MED ORDER — WRIST SPLINT LEFT/RIGHT MISC: 1.0000 | Freq: Once | 0 refills | Status: AC

## 2016-10-03 MED ORDER — ATORVASTATIN CALCIUM 40 MG PO TABS: 40.0000 mg | ORAL_TABLET | Freq: Every day | ORAL | 3 refills | Status: DC

## 2016-10-03 NOTE — Telephone Encounter (Signed)
Prescription sent after visit today. -- Durward Parcelavid McMullen, DO [facility], PGY-2

## 2016-10-03 NOTE — Assessment & Plan Note (Addendum)
Acute. Affecting the left wrist. Right-handed and not currently working. --Given thumb spica splint and recommended conservative treatment including Tylenol q6h PRN and Mobic 15 mg daily with cold compress --Will obtain TSH, last A1c WNL --Would pursue steroid injection for ambulatory referral to sports med if not improved --RTC 3 months or sooner if needed

## 2016-10-03 NOTE — Assessment & Plan Note (Addendum)
Chronic. Uncontrolled today. BP 158/90. Missed doses for past 2 days due to prescription running out. --Refilled lisinopril 40 mg daily and atorvastatin 40 mg daily

## 2016-10-03 NOTE — Assessment & Plan Note (Signed)
Chronic. Controlled on Flonase though prescription ran out. Tends to affect patient during the night with sinus congestion. --Refill provided for Flonase

## 2016-10-03 NOTE — Patient Instructions (Signed)
Thank you for coming in to see Cynthia Hess today. Please see below to review our plan for today's visit.  1. Below I have attached information on what I think is causing your wrist swelling and pain. I have sent a prescription to your pharmacy for a thumb splint which she will use on your wrist to reduce pressure. You can also use Moban 50 mg daily along with Tylenol as needed every 6 hours. I would advise you to not take additional NSAIDs including ibuprofen and Advil as these can cause stomach irritation and damage the kidneys with long-term use. We will test your thyroid level because of its association with your problem. I will notify you if your level is abnormal. 2. I have sent in refills for your medications. If he needs additional refills in the future, you can contact our clinic and these can be filled online.  Return to clinic 3 months or sooner if needed  Please call the clinic at [phone] if your symptoms worsen or you have any concerns. It was my pleasure to see you. -- Durward Parcel, DO [facility], PGY-2  Lollie Sails Tenosynovitis Tendons attach muscles to bones. They also help with joint movements. When tendons become irritated or swollen, it is called tendinitis. The extensor pollicis brevis (EPB) tendon connects the EPB muscle to a bone that is near the base of the thumb. The EPB muscle helps to straighten and extend the thumb. De Quervain tenosynovitis is a condition in which the EPB tendon lining (sheath) becomes irritated, thickened, and swollen. This condition is sometimes called stenosing tenosynovitis. This condition causes pain on the thumb side of the back of the wrist. What are the causes? Causes of this condition include:  Activities that repeatedly cause your thumb and wrist to extend.  A sudden increase in activity or change in activity that affects your wrist.  What increases the risk? This condition is more likely to develop  in:  Females.  People who have diabetes.  Women who have recently given birth.  People who are over 12 years of age.  People who do activities that involve repeated hand and wrist motions, such as tennis, racquetball, volleyball, gardening, and taking care of children.  People who do heavy labor.  People who have poor wrist strength and flexibility.  People who do not warm up properly before activities.  What are the signs or symptoms? Symptoms of this condition include:  Pain or tenderness over the thumb side of the back of the wrist when your thumb and wrist are not moving.  Pain that gets worse when you straighten your thumb or extend your thumb or wrist.  Pain when the injured area is touched.  Locking or catching of the thumb joint while you bend and straighten your thumb.  Decreased thumb motion due to pain.  Swelling over the affected area.  How is this diagnosed? This condition is diagnosed with a medical history and physical exam. Your health care provider will ask for details about your injury and ask about your symptoms. How is this treated? Treatment may include the use of icing and medicines to reduce pain and swelling. You may also be advised to wear a splint or brace to limit your thumb and wrist motion. In less severe cases, treatment may also include working with a physical therapist to strengthen your wrist and calm the irritation around your EPB tendon sheath. In severe cases, surgery may be needed. Follow these instructions at home:  If you have a splint or brace:  Wear it as told by your health care provider. Remove it only as told by your health care provider.  Loosen the splint or brace if your fingers become numb and tingle, or if they turn cold and blue.  Keep the splint or brace clean and dry. Managing pain, stiffness, and swelling  If directed, apply ice to the injured area. ? Put ice in a plastic bag. ? Place a towel between your skin and  the bag. ? Leave the ice on for 20 minutes, 2-3 times per day.  Move your fingers often to avoid stiffness and to lessen swelling.  Raise (elevate) the injured area above the level of your heart while you are sitting or lying down. General instructions  Return to your normal activities as told by your health care provider. Ask your health care provider what activities are safe for you.  Take over-the-counter and prescription medicines only as told by your health care provider.  Keep all follow-up visits as told by your health care provider. This is important.  Do not drive or operate heavy machinery while taking prescription pain medicine. Contact a health care provider if:  Your pain, tenderness, or swelling gets worse, even if you have had treatment.  You have numbness or tingling in your wrist, hand, or fingers on the injured side. This information is not intended to replace advice given to you by your health care provider. Make sure you discuss any questions you have with your health care provider. Document Released: 03/17/2005 Document Revised: 08/23/2015 Document Reviewed: 05/23/2014 Elsevier Interactive Patient Education  [employer]2018 [employer]

## 2016-10-04 LAB — TSH: TSH: 1.4 u[IU]/mL (ref 0.450–4.500)

## 2016-10-07 ENCOUNTER — Telehealth: Payer: Self-pay | Admitting: Family Medicine

## 2016-10-07 NOTE — Telephone Encounter (Signed)
Pt is calling because she thought that the doctor was going to call in a prescription for a patch, so that she can quit smoking. She also requested a brace for her hand. jw

## 2016-10-08 ENCOUNTER — Telehealth: Payer: Self-pay | Admitting: Family Medicine

## 2016-10-08 ENCOUNTER — Other Ambulatory Visit: Payer: Self-pay | Admitting: Family Medicine

## 2016-10-08 ENCOUNTER — Encounter: Payer: Self-pay | Admitting: Family Medicine

## 2016-10-08 MED ORDER — NICOTINE 21 MG/24HR TD PT24: 21.0000 mg | MEDICATED_PATCH | Freq: Every day | TRANSDERMAL | 5 refills | Status: DC

## 2016-10-08 NOTE — Telephone Encounter (Signed)
Contacted patient with no answer. Left voicemail stating nicotine patch available for pickup. Also recommended patient purchase a thumb spica splint over-the-counter. If patient has difficulty attaining this, was instructed to call to be referred to sports medicine to obtain splint. -- Durward Parcelavid McMullen, DO [facility], PGY-2

## 2016-10-08 NOTE — Telephone Encounter (Signed)
Patch prescription sent to pharmacy. Available for pickup. Left voicemail on patient's phone stating she can get the splint over-the-counter. If she has difficulty obtaining this, will need to see sports medicine to get a thumb spica splint for left wrist. -- Durward Parcelavid Katrena Stehlin, DO [facility], PGY-2

## 2016-10-21 ENCOUNTER — Telehealth: Payer: Self-pay | Admitting: Family Medicine

## 2016-10-21 NOTE — Telephone Encounter (Signed)
Pt called because she was not able to get the brace and her hand is still swollen and would like to get the referral to a sports medicine.jw

## 2016-10-22 ENCOUNTER — Other Ambulatory Visit: Payer: Self-pay | Admitting: Family Medicine

## 2016-10-22 DIAGNOSIS — M654 Radial styloid tenosynovitis [de Quervain]: Secondary | ICD-10-CM

## 2016-10-22 NOTE — Telephone Encounter (Signed)
Referral placed for sports medicine. Please notify patient. Thank you.

## 2016-10-23 NOTE — Telephone Encounter (Signed)
Patient has already been scheduled at sports medicine. Kimo Bancroft,CMA

## 2016-11-05 ENCOUNTER — Ambulatory Visit: Payer: Medicare Other | Admitting: Family Medicine

## 2016-11-19 ENCOUNTER — Ambulatory Visit: Payer: Medicare Other | Admitting: Family Medicine

## 2016-12-28 NOTE — Progress Notes (Signed)
   Subjective:   Patient ID: Cynthia Hess    DOB: 02/10/1956, 61 y.o. female   MRN: [id number]  CC: "Left wrist pain"  HPI: Cynthia Hess is a 61 y.o. female who presents to clinic today for left wrist pain. Problems discussed today are as follows:  Left wrist pain: Continues to have left wrist pain unchanged from previous visit back in July. Pain is worse with movement of her thumb. She has continued using her thumb spica splint daily, all day without missing any days in the week. She ran out of her Mobic one month after her last visit and supplemented her pain with Advil and Aleve with approximately 2 tablets every 2 hours as needed. She denies any melena or hematochezia in her stool. ROS: Denies fevers or chills, loss of sensation decreased motor function, rash, cyanosis.  Smoking: Patient is interested in taking Chantix for smoking cessation. She states her friend successfully weaning off of smoking and she is interested in stopping today. ROS: Denies chest pain, shortness of breath, palpitations, cough.  Complete ROS performed, see HPI for pertinent.  PMFSH: HTN, OA, lumbar spondylosis with sciatica, tobacco use disorder, obesity, depression. H/o lumbar fusion L4-S1, hernia repair x3 (ventral), surgical ligation after SBO, TAH. Smoking status reviewed. Medications reviewed.  Objective:   BP 124/80   Pulse 78   Temp 98.3 F (36.8 C) (Oral)   Ht  (1.575 m)   Wt 169 lb (76.7 kg)   SpO2 98%   BMI 30.91 kg/m  Vitals and nursing note reviewed.  General: well nourished, well developed, in no acute distress with non-toxic appearance HEENT: normocephalic, atraumatic, moist mucous membranes CV: regular rate and rhythm without murmurs, rubs, or gallops, no lower extremity edema Lungs: clear to auscultation bilaterally with normal work of breathing Skin: warm, dry, no rashes or lesions, cap refill < 2 seconds Extremities: warm and well perfused, normal tone, positive Finkelstein  test of left wrist, mild edema at radial surface of wrist  Assessment & Plan:   Tobacco use disorder Chronic. Every day smoker. Intermittent cessation. No prior attempts. --Initiating Chantix with f/u in 1 month, given 1-800-QUIT-NOW   De Quervain's tenosynovitis, left Chronic. Consistent with de Quervain's, localized to left wrist. Patient is right-handed. Adherent to conservative management without improvement. --Amb referral to sports medicine for consideration of steroid injection --Continue wearing thumb spica splint daily --Given Mobic 15 mg daily, #30 tabs, no refills  Orders Placed This Encounter  Procedures  . Ambulatory referral to Sports Medicine    Referral Priority:   Routine    Referral Type:   Consultation    Number of Visits Requested:   1   Meds ordered this encounter  Medications  . meloxicam (MOBIC) 15 MG tablet    Sig: Take 1 tablet (15 mg total) by mouth daily.    Dispense:  30 tablet    Refill:  0  . varenicline (CHANTIX) 0.5 MG tablet    Sig: Take 1 tab daily on days 1-3, then 1 tab twice daily on days 4-7, then continue 2 tabs twice daily for 11 more weeks until completed.    Dispense:  55 tablet    Refill:  0    Durward Parcel, DO [facility], PGY-2 12/29/2016 3:05 PM

## 2016-12-29 ENCOUNTER — Ambulatory Visit (INDEPENDENT_AMBULATORY_CARE_PROVIDER_SITE_OTHER): Payer: Medicare Other | Admitting: Family Medicine

## 2016-12-29 ENCOUNTER — Encounter: Payer: Self-pay | Admitting: Family Medicine

## 2016-12-29 VITALS — BP 124/80 | HR 78 | Temp 98.3°F | Ht 62.0 in | Wt 169.0 lb

## 2016-12-29 DIAGNOSIS — M654 Radial styloid tenosynovitis [de Quervain]: Secondary | ICD-10-CM | POA: Diagnosis present

## 2016-12-29 DIAGNOSIS — F17200 Nicotine dependence, unspecified, uncomplicated: Secondary | ICD-10-CM | POA: Diagnosis not present

## 2016-12-29 MED ORDER — VARENICLINE TARTRATE 0.5 MG PO TABS: ORAL_TABLET | ORAL | 0 refills | Status: DC

## 2016-12-29 MED ORDER — MELOXICAM 15 MG PO TABS: 15.0000 mg | ORAL_TABLET | Freq: Every day | ORAL | 0 refills | Status: DC

## 2016-12-29 NOTE — Assessment & Plan Note (Addendum)
Chronic. Every day smoker. Intermittent cessation. No prior attempts. --Initiating Chantix with f/u in 1 month, given 1-800-QUIT-NOW

## 2016-12-29 NOTE — Assessment & Plan Note (Addendum)
Chronic. Consistent with de Quervain's, localized to left wrist. Patient is right-handed. Adherent to conservative management without improvement. --Amb referral to sports medicine for consideration of steroid injection --Continue wearing thumb spica splint daily --Given Mobic 15 mg daily, #30 tabs, no refills

## 2016-12-29 NOTE — Patient Instructions (Signed)
Thank you for coming in to see [place] today. Please see below to review our plan for today's visit.  1. I will start you on a new smoking cessation medication called Chantix. Please take as instructed with a plan to stop all smoking 1 week after starting this medication. I would like to see you in one month to recheck on how things are going. Your insurance may or may not cover this medication. If it does not, please call 1-800-QUIT-NOW  2. I have placed a referral for sports medicine to evaluate your left wrist for a steroid injection. Continue taking the Mobic which I refilled as needed once daily for pain. You should use your splint daily in the meantime.  Please call the clinic at [phone] if your symptoms worsen or you have any concerns. It was my pleasure to see you. -- Durward Parcel, DO [facility], PGY-2

## 2017-01-02 ENCOUNTER — Ambulatory Visit: Payer: Medicare Other | Admitting: Family Medicine

## 2017-01-07 ENCOUNTER — Ambulatory Visit: Payer: Medicare Other | Admitting: Family Medicine

## 2017-01-13 ENCOUNTER — Ambulatory Visit: Payer: Medicare Other | Admitting: Family Medicine

## 2017-01-26 ENCOUNTER — Ambulatory Visit
Admission: RE | Admit: 2017-01-26 | Discharge: 2017-01-26 | Disposition: A | Discharge status: Home / Self Care | Payer: Medicare Other | Source: Ambulatory Visit | Attending: Sports Medicine | Admitting: Sports Medicine

## 2017-01-26 ENCOUNTER — Ambulatory Visit (INDEPENDENT_AMBULATORY_CARE_PROVIDER_SITE_OTHER): Payer: Medicare Other | Admitting: Sports Medicine

## 2017-01-26 VITALS — BP 146/90 | Ht 62.0 in | Wt 170.0 lb

## 2017-01-26 DIAGNOSIS — M654 Radial styloid tenosynovitis [de Quervain]: Secondary | ICD-10-CM

## 2017-01-26 DIAGNOSIS — M25532 Pain in left wrist: Secondary | ICD-10-CM | POA: Diagnosis not present

## 2017-01-26 MED ORDER — PREDNISONE 10 MG PO TABS: ORAL_TABLET | ORAL | 0 refills | Status: DC

## 2017-01-26 NOTE — Progress Notes (Signed)
   Subjective:    Patient ID: Cynthia Hess, female    DOB: 1955/05/09, 61 y.o.   MRN: [id number]  HPI complaint: Left wrist and thumb pain  Very pleasant 61 year old female comes in today complaining of 6 months of left wrist and thumb pain. No trauma that she can recall but a gradual onset of pain that has persisted despite oral anti-inflammatories and immobilization in a thumb spica splint. Her pain is diffuse around the radial aspect of the wrist. She will get swelling in this area as well. Swelling will sometimes go into the dorsum of her hand. She's not had any imaging. She denies numbness or tingling. Denies symptoms in the right hand.  Past medical history reviewed Medications reviewed Allergies reviewed    Review of Systems    as above Objective:   Physical Exam   Well-developed, well-nourished. No acute distress. Awake alert and oriented 3. Vital signs reviewed  Left wrist: Limited range of motion secondary to pain. There is swelling along the radial aspect of the wrist and into the thumb. Pain with CMC grind. Positive Finkelstein's. Tender to palpation across the dorsum of the wrist as well. No erythema. Area is not warm to touch. Decreased grip strength. Good pulses.     Assessment & Plan:   Left wrist pain secondary to first extensor tenosynovitis versus CMC osteoarthritis versus wrist osteoarthritis    X-rays of the left wrist to rule out osteoarthritis. 6 day Sterapred Dosepak to take as directed. Phone follow-up with the phone results when available. We'll delineate further treatment based on those findings.  Addendum: X-rays reviewed. They're unremarkable. I will call the patient in 3-4 days to see how she is responding to the oral prednisone. If she has no improvement, then we will schedule a follow-up appointment for a cortisone injection.

## 2017-01-30 ENCOUNTER — Telehealth: Payer: Self-pay | Admitting: Sports Medicine

## 2017-01-30 NOTE — Telephone Encounter (Signed)
  I spoke with the patient on the phone today about her wrist pain and swelling. She is feeling much better after completing her steroids. She has discontinued wearing her brace. I reassured her that her x-rays show no signs of arthritis. If her pain starts to return then we can consider a cortisone injection. She will follow-up as needed.

## 2017-04-28 ENCOUNTER — Other Ambulatory Visit: Payer: Self-pay

## 2017-04-28 ENCOUNTER — Emergency Department (HOSPITAL_COMMUNITY)
Admission: EM | Admit: 2017-04-28 | Discharge: 2017-04-28 | Disposition: A | Discharge status: Home / Self Care | Payer: Medicare Other | Attending: Emergency Medicine | Admitting: Emergency Medicine

## 2017-04-28 ENCOUNTER — Encounter (HOSPITAL_COMMUNITY): Payer: Self-pay

## 2017-04-28 DIAGNOSIS — E785 Hyperlipidemia, unspecified: Secondary | ICD-10-CM | POA: Insufficient documentation

## 2017-04-28 DIAGNOSIS — F17210 Nicotine dependence, cigarettes, uncomplicated: Secondary | ICD-10-CM | POA: Diagnosis not present

## 2017-04-28 DIAGNOSIS — S8391XA Sprain of unspecified site of right knee, initial encounter: Secondary | ICD-10-CM | POA: Diagnosis not present

## 2017-04-28 DIAGNOSIS — Y939 Activity, unspecified: Secondary | ICD-10-CM | POA: Insufficient documentation

## 2017-04-28 DIAGNOSIS — Y92410 Unspecified street and highway as the place of occurrence of the external cause: Secondary | ICD-10-CM | POA: Diagnosis not present

## 2017-04-28 DIAGNOSIS — I10 Essential (primary) hypertension: Secondary | ICD-10-CM | POA: Insufficient documentation

## 2017-04-28 DIAGNOSIS — Z91040 Latex allergy status: Secondary | ICD-10-CM | POA: Insufficient documentation

## 2017-04-28 DIAGNOSIS — Z79899 Other long term (current) drug therapy: Secondary | ICD-10-CM | POA: Diagnosis not present

## 2017-04-28 DIAGNOSIS — Y998 Other external cause status: Secondary | ICD-10-CM | POA: Diagnosis not present

## 2017-04-28 DIAGNOSIS — S39012A Strain of muscle, fascia and tendon of lower back, initial encounter: Secondary | ICD-10-CM | POA: Insufficient documentation

## 2017-04-28 DIAGNOSIS — S3992XA Unspecified injury of lower back, initial encounter: Secondary | ICD-10-CM | POA: Diagnosis present

## 2017-04-28 DIAGNOSIS — J45909 Unspecified asthma, uncomplicated: Secondary | ICD-10-CM | POA: Diagnosis not present

## 2017-04-28 MED ORDER — ORPHENADRINE CITRATE 30 MG/ML IJ SOLN
60.0000 mg | Freq: Two times a day (BID) | INTRAMUSCULAR | Status: DC
  Administered 2017-04-28: 60 mg via INTRAMUSCULAR
  Filled 2017-04-28: qty 2

## 2017-04-28 MED ORDER — TRAMADOL HCL 50 MG PO TABS
100.0000 mg | ORAL_TABLET | Freq: Once | ORAL | Status: AC
  Administered 2017-04-28: 100 mg via ORAL
  Filled 2017-04-28: qty 2

## 2017-04-28 MED ORDER — TRAMADOL HCL 50 MG PO TABS: 100.0000 mg | ORAL_TABLET | Freq: Four times a day (QID) | ORAL | 0 refills | Status: DC | PRN

## 2017-04-28 MED ORDER — ORPHENADRINE CITRATE ER 100 MG PO TB12: 100.0000 mg | ORAL_TABLET | Freq: Two times a day (BID) | ORAL | 0 refills | Status: DC

## 2017-04-28 MED ORDER — ACETAMINOPHEN 500 MG PO TABS
1000.0000 mg | ORAL_TABLET | Freq: Once | ORAL | Status: AC
  Administered 2017-04-28: 1000 mg via ORAL
  Filled 2017-04-28: qty 2

## 2017-04-28 MED ORDER — DEXAMETHASONE SODIUM PHOSPHATE 10 MG/ML IJ SOLN
10.0000 mg | Freq: Once | INTRAMUSCULAR | Status: AC
  Administered 2017-04-28: 10 mg via INTRAMUSCULAR
  Filled 2017-04-28: qty 1

## 2017-04-28 MED ORDER — ACETAMINOPHEN 500 MG PO TABS: 1000.0000 mg | ORAL_TABLET | Freq: Four times a day (QID) | ORAL | 0 refills | Status: DC | PRN
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
## 2017-04-28 NOTE — Discharge Instructions (Signed)
1.  Take acetaminophen and tramadol every 6 hours as needed for pain.  Once pain is improving, you may start taking acetaminophen only.  Use Norflex every 12 hours as needed for muscle spasm.  Initially, ice may be helpful.  If pain is persisting transition to warm moist heat after the first 3 days. 2.  Follow-up with your family doctor the end of the week.  If symptoms are worsening or not improving you may need other evaluation or referral to physical therapy.
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
## 2017-05-04 ENCOUNTER — Ambulatory Visit: Payer: Medicare Other | Admitting: Sports Medicine

## 2017-08-03 ENCOUNTER — Ambulatory Visit: Payer: Medicare Other | Admitting: Family Medicine

## 2017-11-02 ENCOUNTER — Encounter: Payer: Medicare Other | Admitting: Family Medicine

## 2017-11-03 DIAGNOSIS — I10 Essential (primary) hypertension: Secondary | ICD-10-CM | POA: Diagnosis not present

## 2017-11-03 DIAGNOSIS — R0602 Shortness of breath: Secondary | ICD-10-CM | POA: Diagnosis not present

## 2017-11-03 DIAGNOSIS — R0789 Other chest pain: Secondary | ICD-10-CM | POA: Diagnosis not present

## 2017-11-03 DIAGNOSIS — R079 Chest pain, unspecified: Secondary | ICD-10-CM | POA: Diagnosis not present

## 2017-11-03 DIAGNOSIS — R42 Dizziness and giddiness: Secondary | ICD-10-CM | POA: Diagnosis not present

## 2017-11-24 ENCOUNTER — Other Ambulatory Visit: Payer: Self-pay | Admitting: Family Medicine

## 2017-11-24 DIAGNOSIS — I10 Essential (primary) hypertension: Secondary | ICD-10-CM

## 2017-11-24 DIAGNOSIS — J301 Allergic rhinitis due to pollen: Secondary | ICD-10-CM

## 2017-12-01 ENCOUNTER — Encounter: Payer: Medicare Other | Admitting: Family Medicine

## 2017-12-23 ENCOUNTER — Other Ambulatory Visit: Payer: Self-pay

## 2017-12-23 MED ORDER — NICOTINE 21 MG/24HR TD PT24: 21.0000 mg | MEDICATED_PATCH | Freq: Every day | TRANSDERMAL | 5 refills | Status: DC

## 2017-12-23 NOTE — Telephone Encounter (Signed)
Patient request nicotine patch. Has full medicaid coverage now.  Call back is [phone]  Ples SpecterAlisa Brake, RN [facility])

## 2018-01-30 ENCOUNTER — Encounter (HOSPITAL_COMMUNITY): Payer: Self-pay | Admitting: *Deleted

## 2018-01-30 ENCOUNTER — Other Ambulatory Visit: Payer: Self-pay

## 2018-01-30 ENCOUNTER — Ambulatory Visit (HOSPITAL_COMMUNITY)
Admission: EM | Admit: 2018-01-30 | Discharge: 2018-01-30 | Disposition: A | Discharge status: Home / Self Care | Payer: Medicare Other | Attending: Family Medicine | Admitting: Family Medicine

## 2018-01-30 DIAGNOSIS — M25511 Pain in right shoulder: Secondary | ICD-10-CM

## 2018-01-30 DIAGNOSIS — M5412 Radiculopathy, cervical region: Secondary | ICD-10-CM

## 2018-01-30 MED ORDER — METHYLPREDNISOLONE SODIUM SUCC 125 MG IJ SOLR
INTRAMUSCULAR | Status: AC
  Filled 2018-01-30: qty 2

## 2018-01-30 MED ORDER — METHYLPREDNISOLONE SODIUM SUCC 125 MG IJ SOLR
125.0000 mg | Freq: Once | INTRAMUSCULAR | Status: AC
  Administered 2018-01-30: 125 mg via INTRAMUSCULAR

## 2018-01-30 MED ORDER — CLONIDINE HCL 0.1 MG PO TABS
ORAL_TABLET | ORAL | Status: AC
  Filled 2018-01-30: qty 1

## 2018-01-30 MED ORDER — TRAMADOL HCL 50 MG PO TABS: 50.0000 mg | ORAL_TABLET | Freq: Four times a day (QID) | ORAL | 0 refills | Status: DC | PRN

## 2018-01-30 MED ORDER — HYDROCODONE-ACETAMINOPHEN 5-325 MG PO TABS
1.0000 | ORAL_TABLET | Freq: Once | ORAL | Status: AC
  Administered 2018-01-30: 1 via ORAL

## 2018-01-30 MED ORDER — HYDROCODONE-ACETAMINOPHEN 5-325 MG PO TABS
ORAL_TABLET | ORAL | Status: AC
  Filled 2018-01-30: qty 1

## 2018-01-30 MED ORDER — PREDNISONE 20 MG PO TABS: ORAL_TABLET | ORAL | 0 refills | Status: DC

## 2018-01-30 MED ORDER — CLONIDINE HCL 0.1 MG PO TABS
0.1000 mg | ORAL_TABLET | Freq: Once | ORAL | Status: AC
  Administered 2018-01-30: 0.1 mg via ORAL

## 2018-01-30 NOTE — Discharge Instructions (Addendum)
Schedule an appointment with [facility] on Monday. Wear sling to reduce movement/pain. Start prednisone tomorrow. Continue apply ice and heat to shoulder and neck in 20 minute increments as needed.

## 2018-01-30 NOTE — Progress Notes (Signed)
Orthopedic Tech Progress Note Patient Details:  Cynthia Hess 21-Aug-1955 [id number]  Ortho Devices Type of Ortho Device: Arm sling Ortho Device/Splint Interventions: Application   Post Interventions Patient Tolerated: Well Instructions Provided: Care of device   Saul Fordyce 01/30/2018, 5:48 PM

## 2018-01-30 NOTE — ED Provider Notes (Signed)
[facility]    CSN: [id number] Arrival date & time: 01/30/18  1620     History   Chief Complaint Chief Complaint  Patient presents with  . Neck Pain    HPI Cynthia Hess is a 62 y.o. female.   HPI  History of lumbar back surgery. Complains of acute onset right sided neck pain which is now radiating down to her right shoulder and arm. Current pain is in excess of 10 out of 10.  She reports some mild pain initially over a week ago in the neck and shoulder.  She denies injury or fall.  She has a history of chronic back problems.  Denies any recent problems with her neck or shoulder. She occasionally is experiencing numbness and tingling in lower arm however her greatest concern is the pain.  She is unable to perform any range of motion and has kept the arm immobilized by holding her arm. Past Medical History:  Diagnosis Date  . Arthritis    in low back  . Asthma 2009  . CLOSED FRACTURE OF METATARSAL BONE 12/29/2008   Annotation: minimally displaced fracture through the fifth metatarsal Qualifier: Diagnosis of  By: Daphine Deutscher FNP, Zena Amos    . COSTOCHONDRITIS 09/04/2008   Qualifier: Diagnosis of  By: Daphine Deutscher FNP, Zena Amos    . DENTAL CARIES 02/04/2008   Qualifier: Diagnosis of  By: Daphine Deutscher FNP, Zena Amos    . Depression    meds in the past/ denies 06/16/13  . HERNIATED LUMBOSACRAL DISC 05/02/2008   Qualifier: Diagnosis of  By: Shannon, [place]    . Hyperlipidemia 2013  . Hypertension 2009  . NEPHROLITHIASIS 05/21/2007   Annotation: bilateral Qualifier: Diagnosis of  By: Daphine Deutscher FNP, Zena Amos      Patient Active Problem List   Diagnosis Date Noted  . De Quervain's tenosynovitis, left 10/03/2016  . Allergic rhinitis 10/03/2016  . Vaginal dryness 05/19/2016  . S/P lumbar spinal fusion 09/29/2014  . Primary hypertension 09/01/2013  . S/P hernia repair 06/24/2013  . Obesity (BMI 30-39.9) 06/03/2013  . Tobacco use disorder 06/03/2013  . Lumbosacral spondylosis without myelopathy  10/03/2011  . Sciatica 10/03/2011  . TRANSAMINASES, SERUM, ELEVATED 02/19/2010  . HERNIATED LUMBOSACRAL DISC 05/02/2008  . Osteoarthritis of spine 02/09/2008  . SUBACROMIAL BURSITIS, LEFT 10/30/2007  . DEGENERATIVE DISC DISEASE, CERVICAL SPINE 10/18/2007  . Recurrent incisional hernia with incarceration s/p lap repair w mesh 06/24/2013 05/21/2007  . MICROSCOPIC HEMATURIA 04/22/2007    Past Surgical History:  Procedure Laterality Date  . ABDOMINAL HYSTERECTOMY  1989   still gets pap smears  . BLADDER REPAIR  1989   ?bladder injury  . DILATION AND CURETTAGE OF UTERUS    . HERNIA REPAIR  1990s   reports total of 3 surgeries, most recently 2007  . INSERTION OF MESH N/A 06/24/2013   Procedure: INSERTION OF MESH;  Surgeon: Ardeth Sportsman, MD;  Location: [facility];  Service: General;  Laterality: N/A;  . MAXIMUM ACCESS (MAS)POSTERIOR LUMBAR INTERBODY FUSION (PLIF) 2 LEVEL N/A 09/29/2014   Procedure: FOR MAXIMUM ACCESS (MAS) POSTERIOR LUMBAR INTERBODY FUSION  LUMBAR FOUR-FIVE,LUMBAR FIVE-SACRAL ONE;  Surgeon: Tia Alert, MD;  Location: [facility];  Service: Neurosurgery;  Laterality: N/A;  . MULTIPLE TOOTH EXTRACTIONS    . SMALL INTESTINE SURGERY  1989   for "blockage"  . TONSILLECTOMY  1967  . TONSILLECTOMY    . TUBAL LIGATION  1979  . UMBILICAL HERNIA REPAIR  2005   open w suture  . VENTRAL  HERNIA REPAIR  2007   open w mesh Dr Zachery Dakins  . VENTRAL HERNIA REPAIR N/A 06/24/2013   Procedure: LAPAROSCOPIC VENTRAL WALL HERNIA REPAIR;  Surgeon: Ardeth Sportsman, MD;  Location: [facility];  Service: General;  Laterality: N/A;    OB History    Gravida  4   Para  3   Term  3   Preterm      AB  1   Living  3     SAB  1   TAB      Ectopic      Multiple      Live Births               Home Medications    Prior to Admission medications   Medication Sig Start Date End Date Taking? Authorizing Provider  acetaminophen (TYLENOL) 500 MG tablet Take 2 tablets (1,000 mg total)  by mouth every 6 (six) hours as needed. 04/28/17   Arby Barrette, MD  atorvastatin (LIPITOR) 40 MG tablet TAKE 1 TABLET BY MOUTH EVERY DAY 11/24/17   Wendee Beavers, DO  fluticasone [facility]) 50 MCG/ACT nasal spray USE 2 SPRAYS IN [facility] NOSTRIL ONCE DAILY 11/24/17   Wendee Beavers, DO  lisinopril (PRINIVIL,ZESTRIL) 40 MG tablet TAKE 1 TABLET BY MOUTH ONCE DAILY 11/24/17   Wendee Beavers, DO  nicotine (NICODERM CQ - DOSED IN MG/24 HOURS) 21 mg/24hr patch Place 1 patch (21 mg total) onto the skin daily. 12/23/17   Wendee Beavers, DO  orphenadrine (NORFLEX) 100 MG tablet Take 1 tablet (100 mg total) by mouth 2 (two) times daily. 04/28/17   Arby Barrette, MD  OVER THE COUNTER MEDICATION Take 3 tablets by mouth daily as needed (PAIN). STORE BRAND PAIN RELIEVER    [provider]  predniSONE (DELTASONE) 20 MG tablet Take 3 PO QAM x3days, 2 PO QAM x3days, 1 PO QAM x3days 01/30/18   Bing Neighbors, FNP  traMADol (ULTRAM) 50 MG tablet Take 1 tablet (50 mg total) by mouth every 6 (six) hours as needed. 01/30/18   Bing Neighbors, FNP    Family History Family History  Problem Relation Age of Onset  . Early death Mother        when pt was 18; poisoned  . Early death Father        when pt was 38, unknown   . Asthma Brother   . Diabetes Brother   . Hypertension Brother   . Alzheimer's disease Maternal Grandmother   . Hypertension Brother     Social History Social History   Tobacco Use  . Smoking status: Current Every Day Smoker    Packs/day: 0.15    Years: 25.00    Pack years: 3.75    Types: Cigarettes  . Smokeless tobacco: Never Used  . Tobacco comment: cutting back  Substance Use Topics  . Alcohol use: Yes    Comment: Drinks 2x/month- usually 3-4 beers at a time   . Drug use: No    Allergies   Latex and Banana   Review of Systems Review of Systems Pertinent negatives listed in HPI Physical Exam Triage Vital Signs ED Triage Vitals  Enc Vitals Group      BP 01/30/18 1707 (!) 189/122     Pulse Rate 01/30/18 1707 87     Resp 01/30/18 1707 20     Temp 01/30/18 1707 98.2 F (36.8 C)     Temp Source 01/30/18 1707 Oral  SpO2 01/30/18 1707 98 %     Weight --      Height --      Head Circumference --      Peak Flow --      Pain Score 01/30/18 1710 10     Pain Loc --      Pain Edu? --      Excl. in GC? --    No data found.  Updated Vital Signs BP (!) 183/85   Pulse 87   Temp 98.2 F (36.8 C) (Oral)   Resp 20   SpO2 98%   Visual Acuity Right Eye Distance:   Left Eye Distance:   Bilateral Distance:    Right Eye Near:   Left Eye Near:    Bilateral Near:     Physical Exam General appearance: alert, well developed, well nourished, cooperative and in no distress Head: Normocephalic, without obvious abnormality, atraumatic Respiratory: Respirations even and unlabored, normal respiratory rate Extremities: Right shoulder: Bony tenderness most prominent along ACJ and subacromial bursa. Decreased ROM unable to appropriate measure strength given patient's level of pain Skin: Skin color, texture, turgor normal. No rashes seen  Psych: Appropriate mood and affect. Neurologic: Mental status: Alert, oriented to person, place, and time, thought content appropriate. UC Treatments / Results  Labs (all labs ordered are listed, but only abnormal results are displayed) Labs Reviewed - No data to display  EKG None  Radiology No results found. Procedures Procedures (including critical care time)  Medications Ordered in UC Medications  methylPREDNISolone sodium succinate (SOLU-MEDROL) 125 mg/2 mL injection 125 mg (125 mg Intramuscular Given 01/30/18 1740)  HYDROcodone-acetaminophen (NORCO/VICODIN) 5-325 MG per tablet 1 tablet (1 tablet Oral Given 01/30/18 1739)  cloNIDine (CATAPRES) tablet 0.1 mg (0.1 mg Oral Given 01/30/18 1809)    Initial Impression / Assessment and Plan / UC Course  I have reviewed the triage vital signs and the  nursing notes.  Pertinent labs & imaging results that were available during my care of the patient were reviewed by me and considered in my medical decision making (see chart for details).  Patient presents today in mild distress due to right shoulder pain which is been ongoing and gradually worsening over the last few weeks. Patient reports some weakness and numbness and tingling.  High suspicion for either shoulder impingement syndrome versus bursitis given patient has had no recent injury.  Starting the prednisone taper, treating pain with tramadol, recommending immediate follow-up with orthopedics next business day. Patient given information to contact [facility]. Patient verbalized understanding and agreement with plan.  Red flags discussed.  Final Clinical Impressions(s) / UC Diagnoses   Final diagnoses:  Cervical radiculopathy  Acute pain of right shoulder     Discharge Instructions     Schedule an appointment with [facility] on Monday. Wear sling to reduce movement/pain. Start prednisone tomorrow. Continue apply ice and heat to shoulder and neck in 20 minute increments as needed.    ED Prescriptions    Medication Sig Dispense Auth. Provider   predniSONE (DELTASONE) 20 MG tablet Take 3 PO QAM x3days, 2 PO QAM x3days, 1 PO QAM x3days 18 tablet Bing Neighbors, FNP   traMADol (ULTRAM) 50 MG tablet Take 1 tablet (50 mg total) by mouth every 6 (six) hours as needed. 15 tablet Bing Neighbors, FNP     Controlled Substance Prescriptions [place] Controlled Substance Registry consulted? Yes, I have consulted the [place] Controlled Substances Registry for this patient, and feel the risk/benefit ratio today  is favorable for proceeding with this prescription for a controlled substance.   Bing Neighbors, [place] 02/02/18 [phone]

## 2018-01-30 NOTE — ED Triage Notes (Addendum)
Per pt she is having right neck pain and pain radiates down right arm to finger tips, going on for about 8 days now

## 2018-02-02 ENCOUNTER — Emergency Department (HOSPITAL_COMMUNITY): Payer: Medicare Other

## 2018-02-02 ENCOUNTER — Other Ambulatory Visit: Payer: Self-pay

## 2018-02-02 ENCOUNTER — Encounter (HOSPITAL_COMMUNITY): Payer: Self-pay | Admitting: Emergency Medicine

## 2018-02-02 ENCOUNTER — Emergency Department (HOSPITAL_COMMUNITY)
Admission: EM | Admit: 2018-02-02 | Discharge: 2018-02-02 | Disposition: A | Discharge status: Home / Self Care | Payer: Medicare Other | Attending: Emergency Medicine | Admitting: Emergency Medicine

## 2018-02-02 DIAGNOSIS — F17210 Nicotine dependence, cigarettes, uncomplicated: Secondary | ICD-10-CM | POA: Insufficient documentation

## 2018-02-02 DIAGNOSIS — Z79899 Other long term (current) drug therapy: Secondary | ICD-10-CM | POA: Insufficient documentation

## 2018-02-02 DIAGNOSIS — Z91040 Latex allergy status: Secondary | ICD-10-CM | POA: Insufficient documentation

## 2018-02-02 DIAGNOSIS — E785 Hyperlipidemia, unspecified: Secondary | ICD-10-CM | POA: Diagnosis not present

## 2018-02-02 DIAGNOSIS — M25511 Pain in right shoulder: Secondary | ICD-10-CM

## 2018-02-02 DIAGNOSIS — M542 Cervicalgia: Secondary | ICD-10-CM | POA: Diagnosis not present

## 2018-02-02 DIAGNOSIS — J45909 Unspecified asthma, uncomplicated: Secondary | ICD-10-CM | POA: Diagnosis not present

## 2018-02-02 DIAGNOSIS — I10 Essential (primary) hypertension: Secondary | ICD-10-CM

## 2018-02-02 MED ORDER — LIDOCAINE 5 % EX PTCH
1.0000 | MEDICATED_PATCH | Freq: Once | CUTANEOUS | Status: DC
  Administered 2018-02-02: 1 via TRANSDERMAL
  Filled 2018-02-02: qty 1

## 2018-02-02 MED ORDER — HYDROCODONE-ACETAMINOPHEN 5-325 MG PO TABS: 1.0000 | ORAL_TABLET | Freq: Four times a day (QID) | ORAL | 0 refills | Status: DC | PRN

## 2018-02-02 MED ORDER — HYDROCODONE-ACETAMINOPHEN 5-325 MG PO TABS
1.0000 | ORAL_TABLET | Freq: Once | ORAL | Status: AC
  Administered 2018-02-02: 1 via ORAL
  Filled 2018-02-02: qty 1
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
## 2018-02-02 NOTE — ED Notes (Signed)
Pt stable, ambulatory, states understanding of discharge instructions 

## 2018-02-02 NOTE — Discharge Instructions (Signed)
Please see the information and instructions below regarding your visit.  Your diagnoses today include:  1. Acute pain of right shoulder   2. Elevated blood pressure reading with diagnosis of hypertension     Tests performed today include: See side panel of your discharge paperwork for testing performed today. Vital signs are listed at the bottom of these instructions.   Your x-rays demonstrate severe arthritis.  This is likely causing a pinched nerve.  He will ultimately need an MRI.  Medications prescribed:    Take any prescribed medications only as prescribed, and any over the counter medications only as directed on the packaging.  You have been prescribed Norco for pain. This is an opioid pain medication. You may take this medication every 4-6 hours as needed for pain. Only take this medication if you need it for breakthrough pain.   Do not combine this medication with Tylenol, as it may increase the risk of liver problems.  Do not combine this medication with alcohol.  Please be advised to avoid driving or operating heavy machinery while taking this medication, as it may make you drowsy or impair judgment.    Home care instructions:  Please follow any educational materials contained in this packet.   Please continue to apply heating packs.  Follow-up instructions: Please follow-up with your primary care provider TOMORROW at 10: 10am for for further evaluation of your symptoms.  Please follow up with Dr. Yetta Barre, your neurosurgeon as soon as possible.  Return instructions:  Please return to the Emergency Department if you experience worsening symptoms.  Please return to the emergency department immediately if you develop any progressive numbness, weakness, numbness affecting your lower legs, weakness in your lower legs, or severe worsening pain. Please return if you have any other emergent concerns.  Additional Information:   Your vital signs today were: BP (!) 183/83 (BP  Location: Left Arm)    Pulse 66    Temp 98.2 F (36.8 C) (Oral)    Resp 17    Ht 5\' 2"  (1.575 m)    Wt 74.8 kg    SpO2 98%    BMI 30.18 kg/m  If your blood pressure (BP) was elevated on multiple readings during this visit above 130 for the top number or above 80 for the bottom number, please have this repeated by your primary care provider within one month. --------------  Thank you for allowing [place] to participate in your care today.

## 2018-02-02 NOTE — ED Provider Notes (Signed)
[facility] EMERGENCY DEPARTMENT Provider Note   CSN: [id number] Arrival date & time: 02/02/18  1537     History   Chief Complaint Chief Complaint  Patient presents with  . Shoulder Pain  . Neck Injury    HPI Cynthia Hess is a 62 y.o. female.  HPI  Patient is a 62 year old female with a history of hypertension, allergic rhinitis, herniated lumbosacral disc status post lumbar fusion in 2016 presenting for right-sided neck pain radiating down her right shoulder.  Patient reports that this is been going on for a couple weeks, and is progressively getting worse.  Patient reports that she presented to urgent care 3 days ago, and was told she may have a pinched nerve, however the prednisone and tramadol that she was prescribed are not helping with the pain.  Resting and putting pressure against her back to help with the symptoms, and movement makes it worse.  Patient reports that yesterday she began having paresthesias in the right thumb and index finger.  Patient denies any muscular weakness in her upper or lower extremities, paresthesias of lower extremity's, saddle anesthesia, loss of bowel bladder control, history of cancer, IVDU, or immunocompromise status.  Patient denies recent trauma to her neck or shoulder.  Patient denies any fever or chills with her current symptoms.  Patient denies any chest pain, shortness of breath or pleuritic pain.  Patient has a history of DVT in the setting of surgery many years ago, but has had no recurrence and does not take anticoagulation.  Past Medical History:  Diagnosis Date  . Arthritis    in low back  . Asthma 2009  . CLOSED FRACTURE OF METATARSAL BONE 12/29/2008   Annotation: minimally displaced fracture through the fifth metatarsal Qualifier: Diagnosis of  By: Daphine Deutscher FNP, Zena Amos    . COSTOCHONDRITIS 09/04/2008   Qualifier: Diagnosis of  By: Daphine Deutscher FNP, Zena Amos    . DENTAL CARIES 02/04/2008   Qualifier: Diagnosis of  By:  Daphine Deutscher FNP, Zena Amos    . Depression    meds in the past/ denies 06/16/13  . HERNIATED LUMBOSACRAL DISC 05/02/2008   Qualifier: Diagnosis of  By: Shannon, [place]    . Hyperlipidemia 2013  . Hypertension 2009  . NEPHROLITHIASIS 05/21/2007   Annotation: bilateral Qualifier: Diagnosis of  By: Daphine Deutscher FNP, Zena Amos      Patient Active Problem List   Diagnosis Date Noted  . De Quervain's tenosynovitis, left 10/03/2016  . Allergic rhinitis 10/03/2016  . Vaginal dryness 05/19/2016  . S/P lumbar spinal fusion 09/29/2014  . Primary hypertension 09/01/2013  . S/P hernia repair 06/24/2013  . Obesity (BMI 30-39.9) 06/03/2013  . Tobacco use disorder 06/03/2013  . Lumbosacral spondylosis without myelopathy 10/03/2011  . Sciatica 10/03/2011  . TRANSAMINASES, SERUM, ELEVATED 02/19/2010  . HERNIATED LUMBOSACRAL DISC 05/02/2008  . Osteoarthritis of spine 02/09/2008  . SUBACROMIAL BURSITIS, LEFT 10/30/2007  . DEGENERATIVE DISC DISEASE, CERVICAL SPINE 10/18/2007  . Recurrent incisional hernia with incarceration s/p lap repair w mesh 06/24/2013 05/21/2007  . MICROSCOPIC HEMATURIA 04/22/2007    Past Surgical History:  Procedure Laterality Date  . ABDOMINAL HYSTERECTOMY  1989   still gets pap smears  . BLADDER REPAIR  1989   ?bladder injury  . DILATION AND CURETTAGE OF UTERUS    . HERNIA REPAIR  1990s   reports total of 3 surgeries, most recently 2007  . INSERTION OF MESH N/A 06/24/2013   Procedure: INSERTION OF MESH;  Surgeon: Ardeth Sportsman, MD;  Location:  [facility];  Service: General;  Laterality: N/A;  . MAXIMUM ACCESS (MAS)POSTERIOR LUMBAR INTERBODY FUSION (PLIF) 2 LEVEL N/A 09/29/2014   Procedure: FOR MAXIMUM ACCESS (MAS) POSTERIOR LUMBAR INTERBODY FUSION  LUMBAR FOUR-FIVE,LUMBAR FIVE-SACRAL ONE;  Surgeon: Tia Alert, MD;  Location: [facility];  Service: Neurosurgery;  Laterality: N/A;  . MULTIPLE TOOTH EXTRACTIONS    . SMALL INTESTINE SURGERY  1989   for "blockage"  . TONSILLECTOMY  1967  .  TONSILLECTOMY    . TUBAL LIGATION  1979  . UMBILICAL HERNIA REPAIR  2005   open w suture  . VENTRAL HERNIA REPAIR  2007   open w mesh Dr Zachery Dakins  . VENTRAL HERNIA REPAIR N/A 06/24/2013   Procedure: LAPAROSCOPIC VENTRAL WALL HERNIA REPAIR;  Surgeon: Ardeth Sportsman, MD;  Location: [facility];  Service: General;  Laterality: N/A;     OB History    Gravida  4   Para  3   Term  3   Preterm      AB  1   Living  3     SAB  1   TAB      Ectopic      Multiple      Live Births               Home Medications    Prior to Admission medications   Medication Sig Start Date End Date Taking? Authorizing Provider  acetaminophen (TYLENOL) 500 MG tablet Take 2 tablets (1,000 mg total) by mouth every 6 (six) hours as needed. 04/28/17   Arby Barrette, MD  atorvastatin (LIPITOR) 40 MG tablet TAKE 1 TABLET BY MOUTH EVERY DAY 11/24/17   Wendee Beavers, DO  fluticasone [facility]) 50 MCG/ACT nasal spray USE 2 SPRAYS IN [facility] NOSTRIL ONCE DAILY 11/24/17   Wendee Beavers, DO  lisinopril (PRINIVIL,ZESTRIL) 40 MG tablet TAKE 1 TABLET BY MOUTH ONCE DAILY 11/24/17   Wendee Beavers, DO  nicotine (NICODERM CQ - DOSED IN MG/24 HOURS) 21 mg/24hr patch Place 1 patch (21 mg total) onto the skin daily. 12/23/17   Wendee Beavers, DO  orphenadrine (NORFLEX) 100 MG tablet Take 1 tablet (100 mg total) by mouth 2 (two) times daily. 04/28/17   Arby Barrette, MD  OVER THE COUNTER MEDICATION Take 3 tablets by mouth daily as needed (PAIN). STORE BRAND PAIN RELIEVER    [provider]  predniSONE (DELTASONE) 20 MG tablet Take 3 PO QAM x3days, 2 PO QAM x3days, 1 PO QAM x3days 01/30/18   Bing Neighbors, FNP  traMADol (ULTRAM) 50 MG tablet Take 1 tablet (50 mg total) by mouth every 6 (six) hours as needed. 01/30/18   Bing Neighbors, FNP    Family History Family History  Problem Relation Age of Onset  . Early death Mother        when pt was 39; poisoned  . Early death Father        when  pt was 72, unknown   . Asthma Brother   . Diabetes Brother   . Hypertension Brother   . Alzheimer's disease Maternal Grandmother   . Hypertension Brother     Social History Social History   Tobacco Use  . Smoking status: Current Every Day Smoker    Packs/day: 0.15    Years: 25.00    Pack years: 3.75    Types: Cigarettes  . Smokeless tobacco: Never Used  . Tobacco comment: cutting back  Substance Use Topics  . Alcohol  use: Yes    Comment: Drinks 2x/month- usually 3-4 beers at a time   . Drug use: No     Allergies   Latex and Banana   Review of Systems Review of Systems  Constitutional: Negative for chills and fever.  Respiratory: Negative for shortness of breath.   Cardiovascular: Negative for chest pain.  Gastrointestinal: Negative for abdominal pain, nausea and vomiting.  Musculoskeletal: Positive for arthralgias, myalgias and neck pain. Negative for back pain.  Neurological: Positive for numbness. Negative for weakness.       +paresthesias in right thumb and index finger.  All other systems reviewed and are negative.    Physical Exam Updated Vital Signs BP (!) 188/97 (BP Location: Right Arm)   Pulse 89   Temp 98.2 F (36.8 C) (Oral)   Resp 18   Ht 5\' 2"  (1.575 m)   Wt 74.8 kg   SpO2 98%   BMI 30.18 kg/m   Physical Exam  Constitutional: She appears well-developed and well-nourished. No distress.  Sitting comfortably in bed.  HENT:  Head: Normocephalic and atraumatic.  Eyes: Conjunctivae are normal. Right eye exhibits no discharge. Left eye exhibits no discharge.  EOMs normal to gross examination.  Neck: Normal range of motion.  Cardiovascular: Normal rate and regular rhythm.  Intact, 2+ radial pulse bilaterally.  Pulmonary/Chest: Effort normal and breath sounds normal. She has no wheezes. She has no rales.  Normal respiratory effort. Patient converses comfortably. No audible wheeze or stridor.  Abdominal: She exhibits no distension.    Musculoskeletal:  PALPATION: No midline but paraspinal musculature tenderness of cervical and thoracic spine on right. ROM of cervical spine intact with flexion/extension/lateral flexion/lateral rotation; Patient can laterally rotate cervical spine greater than 45 degrees.  MOTOR: 5/5 strength b/l with resisted shoulder abduction/adduction, biceps flexion (C5/6), biceps extension (C6-C8), wrist flexion, wrist extension (C6-C8), and grip strength (C7-T1) 2+ DTRs in the biceps  SENSORY: Sensation is intact to light and sharp touch in:  Median nerve distribution (tip of index finger)   Ulnar nerve distribution (tip of small finger)  Patient has intact light touch but cannot distinguish sharp from dull in right thumb and index finger. Patient moves LEs symmetrically and with good coordination. Patient ambulates symmetrically with no evidence of LE weakness.  Right shoulder with tenderness to palpation as depicted in image. Normal ROM. Negative empty can test, negative Neer's. +Impingement test. No swelling, erythema or ecchymosis present. No step-off, crepitus, or deformity appreciated. 5/5 muscle strength of UE. 2+ radial pulse, sensation intact and all compartments soft.   Neurological: She is alert.  Cranial nerves intact to gross observation. Patient moves extremities without difficulty.  Skin: Skin is warm and dry. She is not diaphoretic.  Psychiatric: She has a normal mood and affect. Her behavior is normal. Judgment and thought content normal.  Nursing note and vitals reviewed.    ED Treatments / Results  Labs (all labs ordered are listed, but only abnormal results are displayed) Labs Reviewed - No data to display  EKG None  Radiology Dg Cervical Spine Complete  Result Date: 02/02/2018 CLINICAL DATA:  Neck pain radiating to the right shoulder for 2 weeks. EXAM: CERVICAL SPINE - COMPLETE 4+ VIEW COMPARISON:  None. FINDINGS: There is no evidence of cervical spine fracture or  prevertebral soft tissue swelling. Alignment is normal. There are degenerative joint changes with narrowed joint space and osteophyte formation. There are narrowed right C3-4, C4-5, C5-6 neural foramina due to osteophyte encroachment. There is minimal  narrowed left C4-5, C5-6 neural foramina due to osteophyte encroachment. IMPRESSION: Degenerative joint changes of cervical spine with narrowing of several right-sided neural foramina due to osteophyte encroachment as described. Electronically Signed   By: Sherian Rein M.D.   On: 02/02/2018 18:09   Dg Shoulder Right  Result Date: 02/02/2018 CLINICAL DATA:  Right shoulder pain for 2 weeks. EXAM: RIGHT SHOULDER - 2+ VIEW COMPARISON:  None. FINDINGS: There is no evidence of fracture or dislocation. Soft tissues are unremarkable. IMPRESSION: No acute abnormality. Electronically Signed   By: Sherian Rein M.D.   On: 02/02/2018 18:10    Procedures Procedures (including critical care time)  Medications Ordered in ED Medications  HYDROcodone-acetaminophen (NORCO/VICODIN) 5-325 MG per tablet 1 tablet (has no administration in time range)  lidocaine (LIDODERM) 5 % 1 patch (has no administration in time range)     Initial Impression / Assessment and Plan / ED Course  I have reviewed the triage vital signs and the nursing notes.  Pertinent labs & imaging results that were available during my care of the patient were reviewed by me and considered in my medical decision making (see chart for details).  Clinical Course as of Feb 02 1857  Tue Feb 02, 2018  1723 Patient verbally verified a safe ride from the ED. Proceeded with prescribing Norco for pain/relaxtion/muscle relaxation in the ED.    [AM]  1857 I have reviewed the patient's information in the [facility] Controlled Substance Database for the past 12 months and found them to have no overlapping Rx.  Opiates were prescribed for an acute, painful condition. The patient was given information on  side effects and encouraged to use other, non-opiate pain medication primary, only using opiate medicine sparingly for severe pain.   [AM]    Clinical Course User Index [AM] Elisha Ponder, PA-C    Patient is nontoxic-appearing, afebrile, and in no acute distress.  No red flag pains for neck or shoulder pain.  No signs of myelopathy.  Do not suspect pulmonary embolism given the time course of symptoms, no pleuritic pain, no shortness of breath.  Suspect cervical radiculopathy based on the pattern of symptoms.  Suspect possible C6 impingement.  This is supported by radiographs.  Given no signs of myelopathy, feel that MRI urgently indicated but not emergently in ED. discussed with the patient that if her symptoms are progressive such as progressive numbness, weakness in lower extremities, difficulty walking, saddle anesthesia, loss of bowel bladder control, she needs to immediately return to the emergency department.  Spoke with Dr. Dareen Piano of [facility] regarding clinic follow up.  Patient has an appointment tomorrow at 10:10 AM, obtained today.  Discussed recheck for cervical radiculopathy in addition to possible MRI as deemed indicated.  In addition, patient had multiple hypertensive readings in emergency department.  Took lisinopril today.  She did have a recent course of steroids.  No signs of hypertensive urgency/emergency, patient denying any visual disturbance, headache, chest pain or shortness of breath. Final BP after pain control 163/88.  Discussed with patient to use pain medication sparingly, not to drive, drink alcohol, or operate machinery when taking this medication.  Patient is in understanding and agrees with the plan of care.  Final Clinical Impressions(s) / ED Diagnoses   Final diagnoses:  Acute pain of right shoulder  Elevated blood pressure reading with diagnosis of hypertension    ED Discharge Orders         Ordered    HYDROcodone-acetaminophen  (  NORCO/VICODIN) 5-325 MG tablet  Every 6 hours PRN     02/02/18 1902           Delia Chimes 02/02/18 1949    Tilden Fossa, MD 02/08/18 [phone]

## 2018-02-02 NOTE — ED Notes (Signed)
Patient transported to X-ray 

## 2018-02-03 ENCOUNTER — Encounter: Payer: Medicare Other | Admitting: Family Medicine

## 2018-02-03 ENCOUNTER — Ambulatory Visit (INDEPENDENT_AMBULATORY_CARE_PROVIDER_SITE_OTHER): Payer: Medicare Other | Admitting: Family Medicine

## 2018-02-03 DIAGNOSIS — M5412 Radiculopathy, cervical region: Secondary | ICD-10-CM | POA: Insufficient documentation

## 2018-02-03 HISTORY — DX: Radiculopathy, cervical region: M54.12

## 2018-02-03 MED ORDER — GABAPENTIN 100 MG PO CAPS: 100.0000 mg | ORAL_CAPSULE | Freq: Three times a day (TID) | ORAL | 0 refills | Status: DC

## 2018-02-03 MED ORDER — HYDROCODONE-ACETAMINOPHEN 5-325 MG PO TABS: 1.0000 | ORAL_TABLET | Freq: Four times a day (QID) | ORAL | 0 refills | Status: DC | PRN

## 2018-02-03 MED ORDER — CYCLOBENZAPRINE HCL 10 MG PO TABS: 10.0000 mg | ORAL_TABLET | Freq: Two times a day (BID) | ORAL | 0 refills | Status: DC | PRN
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
## 2018-02-03 NOTE — Assessment & Plan Note (Signed)
Symptoms consistent with nerve impingement with primarily pain symptoms without muscle weakness   Cervical xray consistent with this.   Will treat pain with multiple modalities - see after visit summary

## 2018-02-03 NOTE — Patient Instructions (Addendum)
For Pain take as needed  - Tylenol take 2 tabs three times a day unless take a Hydrocodone then skip this - Ibuprofen - 3 tabs three times a day with a little food - Gabapentin 100 mg three times a day can increase by one tab until taking 3 tabs three times a day  - Flexeril 10 mg twice a day  - Hydrocodone when pain is severe - will make you constipated  - Use heat or cool pad on your neck which every helps most  If you have any weakness let us know immediately  Come back in 1 week for a recheck

## 2018-02-15 ENCOUNTER — Encounter: Payer: Medicare Other | Admitting: Family Medicine

## 2018-03-01 ENCOUNTER — Other Ambulatory Visit: Payer: Self-pay | Admitting: Family Medicine

## 2018-03-01 DIAGNOSIS — M5412 Radiculopathy, cervical region: Secondary | ICD-10-CM

## 2018-03-06 ENCOUNTER — Other Ambulatory Visit: Payer: Self-pay | Admitting: Family Medicine

## 2018-03-06 DIAGNOSIS — I10 Essential (primary) hypertension: Secondary | ICD-10-CM

## 2018-03-10 ENCOUNTER — Other Ambulatory Visit: Payer: Self-pay | Admitting: Family Medicine

## 2018-03-10 DIAGNOSIS — I10 Essential (primary) hypertension: Secondary | ICD-10-CM

## 2018-08-16 ENCOUNTER — Other Ambulatory Visit: Payer: Self-pay

## 2018-08-16 DIAGNOSIS — I10 Essential (primary) hypertension: Secondary | ICD-10-CM

## 2018-08-17 MED ORDER — ATORVASTATIN CALCIUM 40 MG PO TABS
40.0000 mg | ORAL_TABLET | Freq: Every day | ORAL | 0 refills | Status: DC
Start: 2018-08-17 — End: 2019-01-03
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
## 2018-11-18 ENCOUNTER — Other Ambulatory Visit: Payer: Self-pay

## 2018-11-18 ENCOUNTER — Ambulatory Visit (HOSPITAL_COMMUNITY)
Admission: RE | Admit: 2018-11-18 | Discharge: 2018-11-18 | Disposition: A | Discharge status: Home / Self Care | Payer: Medicare Other | Source: Ambulatory Visit | Attending: Family Medicine | Admitting: Family Medicine

## 2018-11-18 ENCOUNTER — Ambulatory Visit (INDEPENDENT_AMBULATORY_CARE_PROVIDER_SITE_OTHER): Payer: Medicare Other | Admitting: Family Medicine

## 2018-11-18 ENCOUNTER — Encounter: Payer: Self-pay | Admitting: Family Medicine

## 2018-11-18 VITALS — BP 140/80 | HR 93 | Wt 172.8 lb

## 2018-11-18 DIAGNOSIS — R002 Palpitations: Secondary | ICD-10-CM | POA: Insufficient documentation

## 2018-11-18 DIAGNOSIS — R799 Abnormal finding of blood chemistry, unspecified: Secondary | ICD-10-CM

## 2018-11-18 DIAGNOSIS — Z0000 Encounter for general adult medical examination without abnormal findings: Secondary | ICD-10-CM

## 2018-11-18 DIAGNOSIS — Z1239 Encounter for other screening for malignant neoplasm of breast: Secondary | ICD-10-CM | POA: Diagnosis not present

## 2018-11-18 DIAGNOSIS — E669 Obesity, unspecified: Secondary | ICD-10-CM | POA: Diagnosis not present

## 2018-11-18 DIAGNOSIS — R4586 Emotional lability: Secondary | ICD-10-CM

## 2018-11-18 DIAGNOSIS — Z1231 Encounter for screening mammogram for malignant neoplasm of breast: Secondary | ICD-10-CM | POA: Diagnosis not present

## 2018-11-18 DIAGNOSIS — Z1211 Encounter for screening for malignant neoplasm of colon: Secondary | ICD-10-CM | POA: Diagnosis not present

## 2018-11-18 DIAGNOSIS — F17200 Nicotine dependence, unspecified, uncomplicated: Secondary | ICD-10-CM | POA: Diagnosis not present

## 2018-11-18 DIAGNOSIS — R012 Other cardiac sounds: Secondary | ICD-10-CM | POA: Insufficient documentation

## 2018-11-18 DIAGNOSIS — I10 Essential (primary) hypertension: Secondary | ICD-10-CM | POA: Diagnosis not present

## 2018-11-18 DIAGNOSIS — E6609 Other obesity due to excess calories: Secondary | ICD-10-CM | POA: Diagnosis not present

## 2018-11-18 HISTORY — DX: Emotional lability: R45.86

## 2018-11-18 HISTORY — DX: Palpitations: R00.2

## 2018-11-18 LAB — POCT GLYCOSYLATED HEMOGLOBIN (HGB A1C): Hemoglobin A1C: 5.8 % — AB (ref 4.0–5.6)

## 2018-11-18 NOTE — Patient Instructions (Addendum)
Tobacco Use Disorder  Please follow up with Dr. Raymondo BandKoval for smoking cessation  Tobacco use disorder (TUD) occurs when a person craves, seeks, and uses tobacco, regardless of the consequences. This disorder can cause problems with mental and physical health. It can affect your ability to have healthy relationships, and it can keep you from meeting your responsibilities at work, home, or school. Tobacco may be:  Smoked as a cigarette or cigar.  Inhaled using e-cigarettes.  Smoked in a pipe or hookah.  Chewed as smokeless tobacco.  Inhaled into the nostrils as snuff. Tobacco products contain a dangerous chemical called nicotine, which is very addictive. Nicotine triggers hormones that make the body feel stimulated and works on areas of the brain that make you feel good. These effects can make it hard for people to quit nicotine. Tobacco contains many other unsafe chemicals that can damage almost every organ in the body. Smoking tobacco also puts others in danger due to fire risk and possible health problems caused by breathing in secondhand smoke. What are the signs or symptoms? Symptoms of TUD may include:  Being unable to slow down or stop your tobacco use.  Spending an abnormal amount of time getting or using tobacco.  Craving tobacco. Cravings may last for up to 6 months after quitting.  Tobacco use that: ? Interferes with your work, school, or home life. ? Interferes with your personal and social relationships. ? Makes you give up activities that you once enjoyed or found important.  Using tobacco even though you know that it is: ? Dangerous or bad for your health or someone else's health. ? Causing problems in your life.  Needing more and more of the substance to get the same effect (developing tolerance).  Experiencing unpleasant symptoms if you do not use the substance (withdrawal). Withdrawal symptoms may include: ? Depressed, anxious, or irritable mood. ? Difficulty  concentrating. ? Increased appetite. ? Restlessness or trouble sleeping.  Using the substance to avoid withdrawal. How is this diagnosed? This condition may be diagnosed based on:  Your current and past tobacco use. Your health care provider may ask questions about how your tobacco use affects your life.  A physical exam. You may be diagnosed with TUD if you have at least two symptoms within a 8646-month period. How is this treated? This condition is treated by stopping tobacco use. Many people are unable to quit on their own and need help. Treatment may include:  Nicotine replacement therapy (NRT). NRT provides nicotine without the other harmful chemicals in tobacco. NRT gradually lowers the dosage of nicotine in the body and reduces withdrawal symptoms. NRT is available as: ? Over-the-counter gums, lozenges, and skin patches. ? Prescription mouth inhalers and nasal sprays.  Medicine that acts on the brain to reduce cravings and withdrawal symptoms.  A type of talk therapy that examines your triggers for tobacco use, how to avoid them, and how to cope with cravings (behavioral therapy).  Hypnosis. This may help with withdrawal symptoms.  Joining a support group for others coping with TUD. The best treatment for TUD is usually a combination of medicine, talk therapy, and support groups. Recovery can be a long process. Many people start using tobacco again after stopping (relapse). If you relapse, it does not mean that treatment will not work. Follow these instructions at home:  Lifestyle  Do not use any products that contain nicotine or tobacco, such as cigarettes and e-cigarettes.  Avoid things that trigger tobacco use as much as  you can. Triggers include people and situations that usually cause you to use tobacco.  Avoid drinks that contain caffeine, including coffee. These may worsen some withdrawal symptoms.  Find ways to manage stress. Wanting to smoke may cause stress, and  stress can make you want to smoke. Relaxation techniques such as deep breathing, meditation, and yoga may help.  Attend support groups as needed. These groups are an important part of long-term recovery for many people. General instructions  Take over-the-counter and prescription medicines only as told by your health care provider.  Check with your health care provider before taking any new prescription or over-the-counter medicines.  Decide on a friend, family member, or smoking quit-line (such as 1-800-QUIT-NOW in the [place] that you can call or text when you feel the urge to smoke or when you need help coping with cravings.  Keep all follow-up visits as told by your health care provider and therapist. This is important. Contact a health care provider if:  You are not able to take your medicines as prescribed.  Your symptoms get worse, even with treatment. Summary  Tobacco use disorder (TUD) occurs when a person craves, seeks, and uses tobacco regardless of the consequences.  This condition may be diagnosed based on your current and past tobacco use and a physical exam.  Many people are unable to quit on their own and need help. Recovery can be a long process.  The most effective treatment for TUD is usually a combination of medicine, talk therapy, and support groups. This information is not intended to replace advice given to you by your health care provider. Make sure you discuss any questions you have with your health care provider. Document Released: 11/21/2003 Document Revised: 03/04/2017 Document Reviewed: 03/04/2017 Elsevier Patient Education  2020 [employer].  I have referred you to cardiology for your palpitations.  We will likely set up a Holter monitor.  If you are having chest pain or shortness of breath please go to the emergency room.  You are due for your pneumonia vaccine and tetanus vaccine.  Please call back next week sometime to schedule a nurse visit to get  these.  Have ordered a colonoscopy and mammogram for you.  They will call you to help schedule this.  Dr. Tammi Klippel

## 2018-11-18 NOTE — Assessment & Plan Note (Addendum)
Chronic.  Uncontrolled.  Blood pressure 160/85.  On repeat 140/80. Patient is currently under some stress and having palpitations.  Advised to continue lisinopril 40 mg daily.  We will follow-up in 3 months.  Patient did report that she thinks her blood pressure was initially elevated because she was very nervous when they originally took her blood pressure.  Advised to limit salt intake  and continue daily exercise.

## 2018-11-18 NOTE — Assessment & Plan Note (Addendum)
Have ordered A1c, lipid panel, BMP.  Have ordered colonoscopy and mammogram referral as well.  Patient is up-to-date on other healthcare maintenance.  Is due for Tdap as well as pneumonia vaccine but unfortunately we do not have them in office today.  Advised to follow-up in 1 week for nursing visit and hopefully then we will have vaccines

## 2018-11-18 NOTE — Assessment & Plan Note (Signed)
Patient is interested in quitting.  Would like to meet with Dr. Valentina Lucks for diabetes management.  Card for 1 800 quit now hotline given to patient.

## 2018-11-18 NOTE — Assessment & Plan Note (Signed)
Discussed to continue healthy diet and daily exercise.  A1c obtained today.
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
## 2018-11-18 NOTE — Assessment & Plan Note (Addendum)
Patient with split S2 on physical exam.  EKG within normal limits.  Differentials include septal defect or BBB, however EKG wnl. Currently no chest pain or shortness of breath which is reassuring.  We will plan to obtain TSH, CBC to rule out hospital causes of palpitations including thyroid dysfunction or anemia.  We will also refer to cardiology to obtain cardiac event monitor.  Strict return precautions given.  Advised to go to the emergency department with any shortness of breath or chest pain.  Follow-up in 2 weeks if no improvement.

## 2018-11-19 ENCOUNTER — Telehealth: Payer: Self-pay | Admitting: Family Medicine

## 2018-11-19 DIAGNOSIS — E876 Hypokalemia: Secondary | ICD-10-CM

## 2018-11-19 LAB — COMPREHENSIVE METABOLIC PANEL
ALT: 17 IU/L (ref 0–32)
AST: 18 IU/L (ref 0–40)
Albumin/Globulin Ratio: 1.8 (ref 1.2–2.2)
Albumin: 4.4 g/dL (ref 3.8–4.8)
Alkaline Phosphatase: 79 IU/L (ref 39–117)
BUN/Creatinine Ratio: 13 (ref 12–28)
BUN: 8 mg/dL (ref 8–27)
Bilirubin Total: 0.3 mg/dL (ref 0.0–1.2)
CO2: 22 mmol/L (ref 20–29)
Calcium: 9.3 mg/dL (ref 8.7–10.3)
Chloride: 105 mmol/L (ref 96–106)
Creatinine, Ser: 0.63 mg/dL (ref 0.57–1.00)
GFR calc Af Amer: 111 mL/min/{1.73_m2} (ref >59)
GFR calc non Af Amer: 96 mL/min/{1.73_m2} (ref >59)
Globulin, Total: 2.4 g/dL (ref 1.5–4.5)
Glucose: 130 mg/dL — ABNORMAL HIGH (ref 65–99)
Potassium: 2.9 mmol/L — ABNORMAL LOW (ref 3.5–5.2)
Sodium: 144 mmol/L (ref 134–144)
Total Protein: 6.8 g/dL (ref 6.0–8.5)

## 2018-11-19 LAB — LIPID PANEL
Chol/HDL Ratio: 2.4 ratio (ref 0.0–4.4)
Cholesterol, Total: 126 mg/dL (ref 100–199)
HDL: 52 mg/dL (ref >39)
LDL Calculated: 44 mg/dL (ref 0–99)
Triglycerides: 152 mg/dL — ABNORMAL HIGH (ref 0–149)
VLDL Cholesterol Cal: 30 mg/dL (ref 5–40)

## 2018-11-19 LAB — CBC
Hematocrit: 35.7 % (ref 34.0–46.6)
Hemoglobin: 12.9 g/dL (ref 11.1–15.9)
MCH: 33.2 pg — ABNORMAL HIGH (ref 26.6–33.0)
MCHC: 36.1 g/dL — ABNORMAL HIGH (ref 31.5–35.7)
MCV: 92 fL (ref 79–97)
Platelets: 342 10*3/uL (ref 150–450)
RBC: 3.89 x10E6/uL (ref 3.77–5.28)
RDW: 12.3 % (ref 11.7–15.4)
WBC: 7.9 10*3/uL (ref 3.4–10.8)

## 2018-11-19 LAB — TSH+FREE T4
Free T4: 1.08 ng/dL (ref 0.82–1.77)
TSH: 1.06 u[IU]/mL (ref 0.450–4.500)

## 2018-11-19 MED ORDER — POTASSIUM CHLORIDE CRYS ER 20 MEQ PO TBCR: 20.0000 meq | EXTENDED_RELEASE_TABLET | Freq: Two times a day (BID) | ORAL | 0 refills | Status: DC

## 2018-11-19 NOTE — Telephone Encounter (Signed)
Given that patient has both hypertension and hypokalemia, despite being on a diuretic, will need further work-up for primary hyperaldosteronism.  Will need a morning blood sample for plasma aldosterone concentration and plasma renin activity or plasma renin concentration.  From there we can come up with further diagnosis and treatment.  Also recommend repeat BMP and magnesium at that time to ensure that she is no longer hypokalemic. I informed her these labs will be fasting and she is aware not to eat/drink prior to appointment.   Up to date algorithm for work up: [email or web address]   Will need to replete potassium as patient is very low at 2.9.  We will plan to give 20 mg KDUR twice daily x3 days until her appointment on Monday morning.  I have discussed this with both Dr. Andria Frames and Dr. McDiarmid.  Will forward to Dr. Jeannine Kitten who is seeing patient on Monday, unfortunately I do not have a time slot available on Monday and due to concern of hypokalemia patient should not wait till later in the week.   Dalphine Handing, PGY-3 [facility] 11/19/2018 9:11 AM

## 2018-11-22 ENCOUNTER — Other Ambulatory Visit: Payer: Self-pay

## 2018-11-22 ENCOUNTER — Ambulatory Visit (INDEPENDENT_AMBULATORY_CARE_PROVIDER_SITE_OTHER): Payer: Medicare Other | Admitting: Family Medicine

## 2018-11-22 ENCOUNTER — Encounter: Payer: Self-pay | Admitting: Family Medicine

## 2018-11-22 VITALS — BP 162/88 | HR 73 | Wt 172.2 lb

## 2018-11-22 DIAGNOSIS — E876 Hypokalemia: Secondary | ICD-10-CM

## 2018-11-22 DIAGNOSIS — I10 Essential (primary) hypertension: Secondary | ICD-10-CM | POA: Diagnosis not present

## 2018-11-22 DIAGNOSIS — E119 Type 2 diabetes mellitus without complications: Secondary | ICD-10-CM

## 2018-11-22 NOTE — Progress Notes (Signed)
   [facility] Phone: [phone]     Cynthia Hess - 63 y.o. female MRN [id number]  Date of birth: 1955-09-27  Subjective:   cc: hypertension, hypokalemia  HPI:  Hypertension/hypokalemia: patient has been feeling less palpatations than prior to taking potassium supplements. She feels her heart 'skipping' maybe once a day for a minute.  She takes lisinopril for HTN.  Has not taken any other medications.  No dizziness, no chest pain, no fatigue.    ROS: See HPI for pertinent positives and negatives  Past Medical History  Family history reviewed for today's visit. No changes.  Social history- patient is a smoker  Health Maintenance:  -  Health Maintenance Due  Topic Date Due  . PNEUMOCOCCAL POLYSACCHARIDE VACCINE AGE 67-64 HIGH RISK  05/21/1958  . COLONOSCOPY  05/21/2006  . MAMMOGRAM  11/01/2015  . TETANUS/TDAP  04/21/2017    -  reports that she has been smoking cigarettes. She has a 25.00 pack-year smoking history. She has never used smokeless tobacco.  Objective:   BP (!) 162/88   Pulse 73   Wt 172 lb 3.2 oz (78.1 kg)   SpO2 98%   BMI 31.50 kg/m  Gen: NAD, alert and oriented, cooperative with exam CV: normal rate, regular rhythm. No murmurs, no rubs.  Resp: LCTAB, no wheezes, crackles. normal work of breathing Psych: Appropriate behavior  Assessment/Plan:   Primary hypertension Elevated again today.  162/88.  On lisinopril, but has not tried other medications. Has had hypokalemia while on lisinopril.  Per her pcp's request we will get labwork for hyperaldosteronism as well as check potassium levels.  - .  Aldosterone and renin activity w/ ratio - bmp, mag   Clemetine Marker, MD PGY-2 [facility]

## 2018-11-22 NOTE — Assessment & Plan Note (Signed)
Elevated again today.  162/88.  On lisinopril, but has not tried other medications. Has had hypokalemia while on lisinopril.  Per her pcp's request we will get labwork for hyperaldosteronism as well as check potassium levels.  - .  Aldosterone and renin activity w/ ratio - bmp, mag

## 2018-11-22 NOTE — Patient Instructions (Signed)
We will inform you of the results of your labwork when we get them, which should be tomorrow.  Based on those results we will decide if you need a new medication or further work up.    Have a great day.

## 2018-11-23 ENCOUNTER — Telehealth: Payer: Self-pay | Admitting: Licensed Clinical Social Worker

## 2018-11-23 NOTE — Telephone Encounter (Signed)
   Unsuccessful Phone Outreach Note  11/23/2018 Name: Cynthia Hess MRN: [id number] DOB: [birth year]  Referred by: Caroline More, DO,  Reason for referral : Care Coordination ;.  Cynthia Hess is a 63 y.o. year old female who sees Caroline More, DO for primary care.  LCSW received referral to assist with connecting patient with ongoing counseling and therapy resources.  Called patient to assess needs and barriers reference the above referral. Telephone outreach was unsuccessful. A HIPPA compliant phone message was left for the patient providing contact information and requesting a return call.  Follow Up Plan:  If no return call is received. LCSW will call again in 2 to 3 days.  Casimer Lanius, LCSW Clinical Social Worker [place] [facility] [place]   [phone] 11:13 AM

## 2018-11-26 NOTE — Telephone Encounter (Signed)
   2nd Unsuccessful Phone Outreach Note  11/26/2018 Name: CLARABELL MATSUOKA MRN: [id number] DOB: 1955/10/21  Referred by: Caroline More, DO ,  Reason for referral : Care Coordination   KARALYNE NUSSER is a 63 y.o. year old female who sees Caroline More, DO for primary care. 2nd unsuccessful telephone outreach attempt to Ms. Dina Rich today. A HIPPA compliant phone message was left for the patient providing contact information and requesting a return call.  Plan: LCSW will wait for return call, if no return call is received, will reach out to Ms. Dina Rich again over the next 10 days. If unable to reach Ms. Dina Rich by phone on the 3rd attempt, will discontinue outreach calls but will be available at any time to provide services to Ms. Dina Rich.   Casimer Lanius, LCSW Clinical Social Worker [place] / [place]   [phone] 2:10 PM

## 2018-11-30 ENCOUNTER — Other Ambulatory Visit: Payer: Self-pay | Admitting: Family Medicine

## 2018-11-30 ENCOUNTER — Telehealth: Payer: Self-pay

## 2018-11-30 DIAGNOSIS — E876 Hypokalemia: Secondary | ICD-10-CM

## 2018-11-30 DIAGNOSIS — I10 Essential (primary) hypertension: Secondary | ICD-10-CM

## 2018-11-30 NOTE — Progress Notes (Signed)
Attempted to reach patient but no response.  Left voicemail that I had called.  Patient With BMP showing now normal potassium.  Magnesium 1.9.  Aldosterone level was less than 1.  Given these findings I recommend that patient see cardiology for further work-up of her hypertension with associated hypokalemia.  Given the aldosterone was less than 1 while on an ACE inhibitor this can be a strong predictor of primary hyperaldosteronism.  Regardless patient needs further work-up by cardiology for her resistant hypertension with associated hypokalemia.  Ambulatory referral to cardiology placed.    Dalphine Handing, PGY-3 [facility] 11/30/2018 4:42 PM

## 2018-11-30 NOTE — Telephone Encounter (Signed)
Informed patient of results and what the next step is with cardiology.  Patient was appreciative.  Cynthia Hess, [place]

## 2018-11-30 NOTE — Telephone Encounter (Signed)
Opened in error

## 2018-12-07 NOTE — Telephone Encounter (Addendum)
Care Coordination phone outreach Note Social Work   12/07/2018 Name: Cynthia Hess MRN: [id number] DOB: [birth year]  Cynthia Hess is a 63 y.o. year old female who sees Caroline More, DO for primary care. LCSW was consulted for assistance with connecting patient with counseling and therapy resources. Third phone call to patient to assess needs and barriers. Assessment : Patient reports not interesting in resources at this time as she is dealing with some medical concerns with her heart.  However patient is open to LCSW mailing resources to her. Interventions:Provided patient with information about local counselors that take her insurance  Plan: Patient will review information and F/U if needed  Dr. Tammi Klippel has been informed of this outreach and plan.  Casimer Lanius, LCSW Clinical Social Worker [place] / [place]   [phone] 1:57 PM

## 2018-12-08 NOTE — Progress Notes (Signed)
Cardiology Office Note:    Date:  12/09/2018   ID:  Cynthia Hess, DOB [birth year], MRN [id number]  PCP:  Oralia ManisAbraham, Sherin, DO  Cardiologist:   Nahser   Electrophysiologist:  None   Referring MD: Oralia ManisAbraham, Sherin, DO   Chief Complaint  Patient presents with  . Hypertension     Sept. 10. 2020    Cynthia Hess is a 63 y.o. female with a hx of HTN She has been been very resistant to ACE inhibitors  and her potassium levels have been consistently low   Has not tried any other BP meds.   Smokes - under lots of pressure  Does not watch her diet .  Does not limit her salt intake .  Tomasa BlaseBacon every day . Bologna every day   Walks on occasion , 1 hr every other day  On disability .   Past Medical History:  Diagnosis Date  . Allergic rhinitis 10/03/2016  . Arthritis    in low back  . Asthma 2009  . CLOSED FRACTURE OF METATARSAL BONE 12/29/2008   Annotation: minimally displaced fracture through the fifth metatarsal Qualifier: Diagnosis of  By: Daphine DeutscherMartin FNP, Zena AmosNykedtra    . COSTOCHONDRITIS 09/04/2008   Qualifier: Diagnosis of  By: Daphine DeutscherMartin FNP, Zena AmosNykedtra    . De Quervain's tenosynovitis, left 10/03/2016  . DEGENERATIVE DISC DISEASE, CERVICAL SPINE 10/18/2007   Qualifier: Diagnosis of  By: Daphine DeutscherMartin FNP, Zena AmosNykedtra    . DENTAL CARIES 02/04/2008   Qualifier: Diagnosis of  By: Daphine DeutscherMartin FNP, Zena AmosNykedtra    . Depression    meds in the past/ denies 06/16/13  . HERNIATED LUMBOSACRAL DISC 05/02/2008   Qualifier: Diagnosis of  By: Shannon, Armeniahina    . Hyperlipidemia 2013  . Hypertension 2009  . Left hip pain 11/02/2013  . Lumbosacral spondylosis without myelopathy 10/03/2011  . Microscopic hematuria 04/22/2007   Qualifier: Diagnosis of  By: Levon Hedgerraddock, Brenda    . Mood changes 11/18/2018  . NEPHROLITHIASIS 05/21/2007   Annotation: bilateral Qualifier: Diagnosis of  By: Daphine DeutscherMartin FNP, Zena AmosNykedtra    . Neuropathy, cervical (radicular) 02/03/2018  . Obesity (BMI 30-39.9) 06/03/2013  . Osteoarthritis of spine 02/09/2008   Qualifier:  Diagnosis of  By: Daphine DeutscherMartin FNP, Zena AmosNykedtra    . PAIN IN SOFT TISSUES OF LIMB 04/15/2007   Qualifier: Diagnosis of  By: Daphine DeutscherMartin FNP, Zena AmosNykedtra    . Palpitations 11/18/2018  . Primary hypertension 09/01/2013  . Recurrent incisional hernia with incarceration s/p lap repair w mesh 06/24/2013 05/21/2007   Qualifier: Diagnosis of  By: Daphine DeutscherMartin FNP, Zena AmosNykedtra    . S/P hernia repair 06/24/2013  . S/P lumbar spinal fusion 09/29/2014  . Sciatica 10/03/2011  . SUBACROMIAL BURSITIS, LEFT 10/30/2007   Annotation: per MRI Qualifier: Diagnosis of  By: Daphine DeutscherMartin FNP, Zena AmosNykedtra    . Tobacco use disorder 06/03/2013  . TRANSAMINASES, SERUM, ELEVATED 02/19/2010   Qualifier: Diagnosis of  By: Daphine DeutscherMartin FNP, Zena AmosNykedtra    . TRICHOMONIASIS 04/22/2007   Qualifier: History of  By: Daphine DeutscherMartin FNP, Zena AmosNykedtra    . Vaginal dryness 05/19/2016  . WRIST PAIN, BILATERAL 01/09/2010   Qualifier: Diagnosis of  By: Daphine DeutscherMartin FNP, Zena AmosNykedtra      Past Surgical History:  Procedure Laterality Date  . ABDOMINAL HYSTERECTOMY  1989   still gets pap smears  . BLADDER REPAIR  1989   ?bladder injury  . DILATION AND CURETTAGE OF UTERUS    . HERNIA REPAIR  1990s   reports total of 3 surgeries, most recently 2007  .  INSERTION OF MESH N/A 06/24/2013   Procedure: INSERTION OF MESH;  Surgeon: Ardeth Sportsman, MD;  Location: [facility];  Service: General;  Laterality: N/A;  . MAXIMUM ACCESS (MAS)POSTERIOR LUMBAR INTERBODY FUSION (PLIF) 2 LEVEL N/A 09/29/2014   Procedure: FOR MAXIMUM ACCESS (MAS) POSTERIOR LUMBAR INTERBODY FUSION  LUMBAR FOUR-FIVE,LUMBAR FIVE-SACRAL ONE;  Surgeon: Tia Alert, MD;  Location: [facility];  Service: Neurosurgery;  Laterality: N/A;  . MULTIPLE TOOTH EXTRACTIONS    . SMALL INTESTINE SURGERY  1989   for "blockage"  . TONSILLECTOMY  1967  . TONSILLECTOMY    . TUBAL LIGATION  1979  . UMBILICAL HERNIA REPAIR  2005   open w suture  . VENTRAL HERNIA REPAIR  2007   open w mesh Dr Zachery Dakins  . VENTRAL HERNIA REPAIR N/A 06/24/2013   Procedure:  LAPAROSCOPIC VENTRAL WALL HERNIA REPAIR;  Surgeon: Ardeth Sportsman, MD;  Location: [facility];  Service: General;  Laterality: N/A;    Current Medications: Current Meds  Medication Sig  . atorvastatin (LIPITOR) 40 MG tablet Take 1 tablet (40 mg total) by mouth daily.  . fluticasone (FLONASE) 50 MCG/ACT nasal spray USE 2 SPRAYS IN EACH NOSTRIL ONCE DAILY  . lisinopril (PRINIVIL,ZESTRIL) 40 MG tablet TAKE 1 TABLET BY MOUTH ONCE DAILY  . OVER THE COUNTER MEDICATION Take 3 tablets by mouth daily as needed (PAIN). STORE BRAND PAIN RELIEVER     Allergies:   Latex and Banana   Social History   Socioeconomic History  . Marital status: Single    Spouse name: Not on file  . Number of children: Not on file  . Years of education: Not on file  . Highest education level: Not on file  Occupational History  . Not on file  Social Needs  . Financial resource strain: Not on file  . Food insecurity    Worry: Not on file    Inability: Not on file  . Transportation needs    Medical: Not on file    Non-medical: Not on file  Tobacco Use  . Smoking status: Current Every Day Smoker    Packs/day: 1.00    Years: 25.00    Pack years: 25.00    Types: Cigarettes  . Smokeless tobacco: Never Used  Substance and Sexual Activity  . Alcohol use: Yes    Alcohol/week: 3.0 standard drinks    Types: 3 Cans of beer per week  . Drug use: Never  . Sexual activity: Not Currently    Partners: Male    Birth control/protection: Surgical  Lifestyle  . Physical activity    Days per week: Not on file    Minutes per session: Not on file  . Stress: Not on file  Relationships  . Social Musician on phone: Not on file    Gets together: Not on file    Attends religious service: Not on file    Active member of club or organization: Not on file    Attends meetings of clubs or organizations: Not on file    Relationship status: Not on file  Other Topics Concern  . Not on file  Social History Narrative    Previous MD was Dr. Philipp Deputy, last seen 08/2011      Has notes from Dr. Wynn Banker (chronic pain mgmt)- non-narcotic txt only per most recent note 09/2011 due to UDS + for codeine, butalbital, ethanol      CHRONIC BACK PAIN PATIENT      Is currently  unemployed, finished some high school    On disability for her back- had a fall in 2010       Lives with daughter, Dario GuardianWanda Hess   Uses cigarettes and EtOH, denies drugs      Current meds:   Lisinopril 40 qd   Clonidine 0.1mg  qhs (restarting 08/2013)     Family History: The patient's family history includes Alzheimer's disease in her maternal grandmother; Asthma in her brother; Diabetes in her brother; Early death in her father and mother; Heart murmur in her brother; Hypertension in her brother and brother.  ROS:   Please see the history of present illness.     All other systems reviewed and are negative.  EKGs/Labs/Other Studies Reviewed:    The following studies were reviewed today:   EKG:   Recent Labs: 11/18/2018: ALT 17; Hemoglobin 12.9; Platelets 342; TSH 1.060 11/22/2018: BUN 8; Creatinine, Ser 0.57; Magnesium 1.9; Potassium 3.8; Sodium 144  Recent Lipid Panel    Component Value Date/Time   CHOL 126 11/18/2018 1511   TRIG 152 (H) 11/18/2018 1511   HDL 52 11/18/2018 1511   CHOLHDL 2.4 11/18/2018 1511   CHOLHDL 4.6 03/21/2015 1632   VLDL 68 (H) 03/21/2015 1632   LDLCALC 44 11/18/2018 1511    Physical Exam:    VS:  BP (!) 148/96   Pulse 93   Ht 5\' 2"  (1.575 m)   Wt 172 lb 12.8 oz (78.4 kg)   SpO2 98%   BMI 31.61 kg/m     Wt Readings from Last 3 Encounters:  12/09/18 172 lb 12.8 oz (78.4 kg)  11/22/18 172 lb 3.2 oz (78.1 kg)  11/18/18 172 lb 12.8 oz (78.4 kg)     GEN: moderately obese  HEENT: Normal NECK: No JVD; No carotid bruits LYMPHATICS: No lymphadenopathy CARDIAC:  RR   RESPIRATORY:  Clear to auscultation without rales, wheezing or rhonchi  ABDOMEN: Soft, non-tender, non-distended MUSCULOSKELETAL:  No  edema; No deformity  SKIN: Warm and dry NEUROLOGIC:  Alert and oriented x 3 PSYCHIATRIC:  Normal affect   ASSESSMENT:    1. Primary hypertension   2. Obesity (BMI 30-39.9)    PLAN:    In order of problems listed above:  1. Essential HTN:     Cynthia Hess presents with HTN and hypokalemia.    She eats a very salty diet.    K has been low.   I think she would benefit from starting spironolactone.  We will start spironolactone 25 mg a day.  Have advised her to really work on a low-salt diet.  Have also advised her to exercise on a regular basis.  We will have her see my APP in 3 months.  If her blood pressure is well controlled then I think I can see her on an as-needed basis and will turn her back over to her primary medical doctor.  We will see her back again if she needs any further cardiology work-up.   Medication Adjustments/Labs and Tests Ordered: Current medicines are reviewed at length with the patient today.  Concerns regarding medicines are outlined above.  Orders Placed This Encounter  Procedures  . Basic metabolic panel   Meds ordered this encounter  Medications  . spironolactone (ALDACTONE) 25 MG tablet    Sig: Take 1 tablet (25 mg total) by mouth daily.    Dispense:  90 tablet    Refill:  3    Patient Instructions  Medication Instructions:  Your physician has recommended you make  the following change in your medication: START Aldactone (spironolactone) 25 mg Daily  If you need a refill on your cardiac medications before your next appointment, please call your pharmacy.   Lab work: Your physician recommends that you return for lab work in:  2-3 Weeks BMET  If you have labs (blood work) drawn today and your tests are completely normal, you will receive your results only by: Marland Kitchen MyChart Message (if you have MyChart) OR . A paper copy in the mail If you have any lab test that is abnormal or we need to change your treatment, we will call you to review the results.   Testing/Procedures: None Ordered  Follow-Up: At [facility], you and your health needs are our priority.  As part of our continuing mission to provide you with exceptional heart care, we have created designated Provider Care Teams.  These Care Teams include your primary Cardiologist (physician) and Advanced Practice Providers (APPs -  Physician Assistants and Nurse Practitioners) who all work together to provide you with the care you need, when you need it. You will need a follow up appointment in: 3 Months.  Please call our office 2 months in advance to schedule this appointment.  You may see Dr. Elease Hashimoto or one of the following Advanced Practice Providers on your designated Care Team: Cynthia Newcomer, PA-C Cynthia Hess, [place] . Cynthia Bon, NP   DASH Eating Plan DASH stands for "Dietary Approaches to Stop Hypertension." The DASH eating plan is a healthy eating plan that has been shown to reduce high blood pressure (hypertension). It may also reduce your risk for type 2 diabetes, heart disease, and stroke. The DASH eating plan may also help with weight loss. What are tips for following this plan?  General guidelines  Avoid eating more than 2,300 mg (milligrams) of salt (sodium) a day. If you have hypertension, you may need to reduce your sodium intake to 1,500 mg a day.  Limit alcohol intake to no more than 1 drink a day for nonpregnant women and 2 drinks a day for men. One drink equals 12 oz of beer, 5 oz of wine, or 1 oz of hard liquor.  Work with your health care provider to maintain a healthy body weight or to lose weight. Ask what an ideal weight is for you.  Get at least 30 minutes of exercise that causes your heart to beat faster (aerobic exercise) most days of the week. Activities may include walking, swimming, or biking.  Work with your health care provider or diet and nutrition specialist (dietitian) to adjust your eating plan to your individual calorie needs. Reading food labels    Check food labels for the amount of sodium per serving. Choose foods with less than 5 percent of the Daily Value of sodium. Generally, foods with less than 300 mg of sodium per serving fit into this eating plan.  To find whole grains, look for the word "whole" as the first word in the ingredient list. Shopping  Buy products labeled as "low-sodium" or "no salt added."  Buy fresh foods. Avoid canned foods and premade or frozen meals. Cooking  Avoid adding salt when cooking. Use salt-free seasonings or herbs instead of table salt or sea salt. Check with your health care provider or pharmacist before using salt substitutes.  Do not fry foods. Cook foods using healthy methods such as baking, boiling, grilling, and broiling instead.  Cook with heart-healthy oils, such as olive, canola, soybean, or sunflower oil. Meal planning  Eat a balanced diet that includes: ? 5 or more servings of fruits and vegetables each day. At each meal, try to fill half of your plate with fruits and vegetables. ? Up to 6-8 servings of whole grains each day. ? Less than 6 oz of lean meat, poultry, or fish each day. A 3-oz serving of meat is about the same size as a deck of cards. One egg equals 1 oz. ? 2 servings of low-fat dairy each day. ? A serving of nuts, seeds, or beans 5 times each week. ? Heart-healthy fats. Healthy fats called Omega-3 fatty acids are found in foods such as flaxseeds and coldwater fish, like sardines, salmon, and mackerel.  Limit how much you eat of the following: ? Canned or prepackaged foods. ? Food that is high in trans fat, such as fried foods. ? Food that is high in saturated fat, such as fatty meat. ? Sweets, desserts, sugary drinks, and other foods with added sugar. ? Full-fat dairy products.  Do not salt foods before eating.  Try to eat at least 2 vegetarian meals each week.  Eat more home-cooked food and less restaurant, buffet, and fast food.  When eating at a  restaurant, ask that your food be prepared with less salt or no salt, if possible. What foods are recommended? The items listed may not be a complete list. Talk with your dietitian about what dietary choices are best for you. Grains Whole-grain or whole-wheat bread. Whole-grain or whole-wheat pasta. Brown rice. Cynthia Hess. Bulgur. Whole-grain and low-sodium cereals. Pita bread. Low-fat, low-sodium crackers. Whole-wheat flour tortillas. Vegetables Fresh or frozen vegetables (raw, steamed, roasted, or grilled). Low-sodium or reduced-sodium tomato and vegetable juice. Low-sodium or reduced-sodium tomato sauce and tomato paste. Low-sodium or reduced-sodium canned vegetables. Fruits All fresh, dried, or frozen fruit. Canned fruit in natural juice (without added sugar). Meat and other protein foods Skinless chicken or [place]. Ground chicken or [place]. Pork with fat trimmed off. Fish and seafood. Egg whites. Dried beans, peas, or lentils. Unsalted nuts, nut butters, and seeds. Unsalted canned beans. Lean cuts of beef with fat trimmed off. Low-sodium, lean deli meat. Dairy Low-fat (1%) or fat-free (skim) milk. Fat-free, low-fat, or reduced-fat cheeses. Nonfat, low-sodium ricotta or cottage cheese. Low-fat or nonfat yogurt. Low-fat, low-sodium cheese. Fats and oils Soft margarine without trans fats. Vegetable oil. Low-fat, reduced-fat, or light mayonnaise and salad dressings (reduced-sodium). Canola, safflower, olive, soybean, and sunflower oils. Avocado. Seasoning and other foods Herbs. Spices. Seasoning mixes without salt. Unsalted popcorn and pretzels. Fat-free sweets. What foods are not recommended? The items listed may not be a complete list. Talk with your dietitian about what dietary choices are best for you. Grains Baked goods made with fat, such as croissants, muffins, or some breads. Dry pasta or rice meal packs. Vegetables Creamed or fried vegetables. Vegetables in a cheese sauce.  Regular canned vegetables (not low-sodium or reduced-sodium). Regular canned tomato sauce and paste (not low-sodium or reduced-sodium). Regular tomato and vegetable juice (not low-sodium or reduced-sodium). Cynthia Hess. Olives. Fruits Canned fruit in a light or heavy syrup. Fried fruit. Fruit in cream or butter sauce. Meat and other protein foods Fatty cuts of meat. Ribs. Fried meat. Tomasa Blase. Sausage. Bologna and other processed lunch meats. Salami. Fatback. Hotdogs. Bratwurst. Salted nuts and seeds. Canned beans with added salt. Canned or smoked fish. Whole eggs or egg yolks. Chicken or [place] with skin. Dairy Whole or 2% milk, cream, and half-and-half. Whole or full-fat cream cheese. Whole-fat or sweetened yogurt.  Full-fat cheese. Nondairy creamers. Whipped toppings. Processed cheese and cheese spreads. Fats and oils Butter. Stick margarine. Lard. Shortening. Ghee. Bacon fat. Tropical oils, such as coconut, palm kernel, or palm oil. Seasoning and other foods Salted popcorn and pretzels. Onion salt, garlic salt, seasoned salt, table salt, and sea salt. Worcestershire sauce. Tartar sauce. Barbecue sauce. Teriyaki sauce. Soy sauce, including reduced-sodium. Steak sauce. Canned and packaged gravies. Fish sauce. Oyster sauce. Cocktail sauce. Horseradish that you find on the shelf. Ketchup. Mustard. Meat flavorings and tenderizers. Bouillon cubes. Hot sauce and Tabasco sauce. Premade or packaged marinades. Premade or packaged taco seasonings. Relishes. Regular salad dressings. Where to find more information:  National Heart, Lung, and [place]: https://wilson-eaton.com/  American Heart Association: www.heart.org Summary  The DASH eating plan is a healthy eating plan that has been shown to reduce high blood pressure (hypertension). It may also reduce your risk for type 2 diabetes, heart disease, and stroke.  With the DASH eating plan, you should limit salt (sodium) intake to 2,300 mg a day. If you have  hypertension, you may need to reduce your sodium intake to 1,500 mg a day.  When on the DASH eating plan, aim to eat more fresh fruits and vegetables, whole grains, lean proteins, low-fat dairy, and heart-healthy fats.  Work with your health care provider or diet and nutrition specialist (dietitian) to adjust your eating plan to your individual calorie needs. This information is not intended to replace advice given to you by your health care provider. Make sure you discuss any questions you have with your health care provider. Document Released: 03/06/2011 Document Revised: 02/27/2017 Document Reviewed: 03/10/2016 Elsevier Patient Education  2020 [place], Mertie Moores, MD  12/09/2018 5:41 PM    [place]

## 2018-12-09 ENCOUNTER — Encounter: Payer: Self-pay | Admitting: Cardiovascular Disease

## 2018-12-09 ENCOUNTER — Ambulatory Visit (INDEPENDENT_AMBULATORY_CARE_PROVIDER_SITE_OTHER): Payer: Medicare Other | Admitting: Cardiovascular Disease

## 2018-12-09 ENCOUNTER — Other Ambulatory Visit: Payer: Self-pay

## 2018-12-09 VITALS — BP 148/96 | HR 93 | Ht 62.0 in | Wt 172.8 lb

## 2018-12-09 DIAGNOSIS — I10 Essential (primary) hypertension: Secondary | ICD-10-CM | POA: Diagnosis not present

## 2018-12-09 DIAGNOSIS — E669 Obesity, unspecified: Secondary | ICD-10-CM | POA: Diagnosis not present

## 2018-12-09 MED ORDER — SPIRONOLACTONE 25 MG PO TABS: 25.0000 mg | ORAL_TABLET | Freq: Every day | ORAL | 3 refills | Status: DC

## 2018-12-09 NOTE — Patient Instructions (Signed)
Medication Instructions:  Your physician has recommended you make the following change in your medication: START Aldactone (spironolactone) 25 mg Daily  If you need a refill on your cardiac medications before your next appointment, please call your pharmacy.   Lab work: Your physician recommends that you return for lab work in:  2-3 Weeks BMET  If you have labs (blood work) drawn today and your tests are completely normal, you will receive your results only by: Marland Kitchen MyChart Message (if you have MyChart) OR . A paper copy in the mail If you have any lab test that is abnormal or we need to change your treatment, we will call you to review the results.  Testing/Procedures: None Ordered  Follow-Up: At [facility], you and your health needs are our priority.  As part of our continuing mission to provide you with exceptional heart care, we have created designated Provider Care Teams.  These Care Teams include your primary Cardiologist (physician) and Advanced Practice Providers (APPs -  Physician Assistants and Nurse Practitioners) who all work together to provide you with the care you need, when you need it. You will need a follow up appointment in: 3 Months.  Please call our office 2 months in advance to schedule this appointment.  You may see Dr. Elease Hashimoto or one of the following Advanced Practice Providers on your designated Care Team: Tereso Newcomer, PA-C [place], [place] . Berton Bon, NP   DASH Eating Plan DASH stands for "Dietary Approaches to Stop Hypertension." The DASH eating plan is a healthy eating plan that has been shown to reduce high blood pressure (hypertension). It may also reduce your risk for type 2 diabetes, heart disease, and stroke. The DASH eating plan may also help with weight loss. What are tips for following this plan?  General guidelines  Avoid eating more than 2,300 mg (milligrams) of salt (sodium) a day. If you have hypertension, you may need to reduce your  sodium intake to 1,500 mg a day.  Limit alcohol intake to no more than 1 drink a day for nonpregnant women and 2 drinks a day for men. One drink equals 12 oz of beer, 5 oz of wine, or 1 oz of hard liquor.  Work with your health care provider to maintain a healthy body weight or to lose weight. Ask what an ideal weight is for you.  Get at least 30 minutes of exercise that causes your heart to beat faster (aerobic exercise) most days of the week. Activities may include walking, swimming, or biking.  Work with your health care provider or diet and nutrition specialist (dietitian) to adjust your eating plan to your individual calorie needs. Reading food labels   Check food labels for the amount of sodium per serving. Choose foods with less than 5 percent of the Daily Value of sodium. Generally, foods with less than 300 mg of sodium per serving fit into this eating plan.  To find whole grains, look for the word "whole" as the first word in the ingredient list. Shopping  Buy products labeled as "low-sodium" or "no salt added."  Buy fresh foods. Avoid canned foods and premade or frozen meals. Cooking  Avoid adding salt when cooking. Use salt-free seasonings or herbs instead of table salt or sea salt. Check with your health care provider or pharmacist before using salt substitutes.  Do not fry foods. Cook foods using healthy methods such as baking, boiling, grilling, and broiling instead.  Cook with heart-healthy oils, such as olive,  canola, soybean, or sunflower oil. Meal planning  Eat a balanced diet that includes: ? 5 or more servings of fruits and vegetables each day. At each meal, try to fill half of your plate with fruits and vegetables. ? Up to 6-8 servings of whole grains each day. ? Less than 6 oz of lean meat, poultry, or fish each day. A 3-oz serving of meat is about the same size as a deck of cards. One egg equals 1 oz. ? 2 servings of low-fat dairy each day. ? A serving of  nuts, seeds, or beans 5 times each week. ? Heart-healthy fats. Healthy fats called Omega-3 fatty acids are found in foods such as flaxseeds and coldwater fish, like sardines, salmon, and mackerel.  Limit how much you eat of the following: ? Canned or prepackaged foods. ? Food that is high in trans fat, such as fried foods. ? Food that is high in saturated fat, such as fatty meat. ? Sweets, desserts, sugary drinks, and other foods with added sugar. ? Full-fat dairy products.  Do not salt foods before eating.  Try to eat at least 2 vegetarian meals each week.  Eat more home-cooked food and less restaurant, buffet, and fast food.  When eating at a restaurant, ask that your food be prepared with less salt or no salt, if possible. What foods are recommended? The items listed may not be a complete list. Talk with your dietitian about what dietary choices are best for you. Grains Whole-grain or whole-wheat bread. Whole-grain or whole-wheat pasta. Brown rice. Orpah Cobbatmeal. Quinoa. Bulgur. Whole-grain and low-sodium cereals. Pita bread. Low-fat, low-sodium crackers. Whole-wheat flour tortillas. Vegetables Fresh or frozen vegetables (raw, steamed, roasted, or grilled). Low-sodium or reduced-sodium tomato and vegetable juice. Low-sodium or reduced-sodium tomato sauce and tomato paste. Low-sodium or reduced-sodium canned vegetables. Fruits All fresh, dried, or frozen fruit. Canned fruit in natural juice (without added sugar). Meat and other protein foods Skinless chicken or Malawiturkey. Ground chicken or Mal[place]iturkey. Pork with fat trimmed off. Fish and seafood. Egg whites. Dried beans, peas, or lentils. Unsalted nuts, nut butters, and seeds. Unsalted canned beans. Lean cuts of beef with fat trimmed off. Low-sodium, lean deli meat. Dairy Low-fat (1%) or fat-free (skim) milk. Fat-free, low-fat, or reduced-fat cheeses. Nonfat, low-sodium ricotta or cottage cheese. Low-fat or nonfat yogurt. Low-fat, low-sodium cheese.  Fats and oils Soft margarine without trans fats. Vegetable oil. Low-fat, reduced-fat, or light mayonnaise and salad dressings (reduced-sodium). Canola, safflower, olive, soybean, and sunflower oils. Avocado. Seasoning and other foods Herbs. Spices. Seasoning mixes without salt. Unsalted popcorn and pretzels. Fat-free sweets. What foods are not recommended? The items listed may not be a complete list. Talk with your dietitian about what dietary choices are best for you. Grains Baked goods made with fat, such as croissants, muffins, or some breads. Dry pasta or rice meal packs. Vegetables Creamed or fried vegetables. Vegetables in a cheese sauce. Regular canned vegetables (not low-sodium or reduced-sodium). Regular canned tomato sauce and paste (not low-sodium or reduced-sodium). Regular tomato and vegetable juice (not low-sodium or reduced-sodium). Rosita FirePickles. Olives. Fruits Canned fruit in a light or heavy syrup. Fried fruit. Fruit in cream or butter sauce. Meat and other protein foods Fatty cuts of meat. Ribs. Fried meat. Tomasa BlaseBacon. Sausage. Bologna and other processed lunch meats. Salami. Fatback. Hotdogs. Bratwurst. Salted nuts and seeds. Canned beans with added salt. Canned or smoked fish. Whole eggs or egg yolks. Chicken or Malawiturkey with skin. Dairy Whole or 2% milk, cream, and half-and-half. Whole  or full-fat cream cheese. Whole-fat or sweetened yogurt. Full-fat cheese. Nondairy creamers. Whipped toppings. Processed cheese and cheese spreads. Fats and oils Butter. Stick margarine. Lard. Shortening. Ghee. Bacon fat. Tropical oils, such as coconut, palm kernel, or palm oil. Seasoning and other foods Salted popcorn and pretzels. Onion salt, garlic salt, seasoned salt, table salt, and sea salt. Worcestershire sauce. Tartar sauce. Barbecue sauce. Teriyaki sauce. Soy sauce, including reduced-sodium. Steak sauce. Canned and packaged gravies. Fish sauce. Oyster sauce. Cocktail sauce. Horseradish that you  find on the shelf. Ketchup. Mustard. Meat flavorings and tenderizers. Bouillon cubes. Hot sauce and Tabasco sauce. Premade or packaged marinades. Premade or packaged taco seasonings. Relishes. Regular salad dressings. Where to find more information:  National Heart, Lung, and [place]: https://wilson-eaton.com/  American Heart Association: www.heart.org Summary  The DASH eating plan is a healthy eating plan that has been shown to reduce high blood pressure (hypertension). It may also reduce your risk for type 2 diabetes, heart disease, and stroke.  With the DASH eating plan, you should limit salt (sodium) intake to 2,300 mg a day. If you have hypertension, you may need to reduce your sodium intake to 1,500 mg a day.  When on the DASH eating plan, aim to eat more fresh fruits and vegetables, whole grains, lean proteins, low-fat dairy, and heart-healthy fats.  Work with your health care provider or diet and nutrition specialist (dietitian) to adjust your eating plan to your individual calorie needs. This information is not intended to replace advice given to you by your health care provider. Make sure you discuss any questions you have with your health care provider. Document Released: 03/06/2011 Document Revised: 02/27/2017 Document Reviewed: 03/10/2016 Elsevier Patient Education  2020 [employer].

## 2018-12-20 ENCOUNTER — Other Ambulatory Visit: Payer: Medicare Other | Admitting: *Deleted

## 2018-12-20 ENCOUNTER — Other Ambulatory Visit: Payer: Self-pay

## 2018-12-20 ENCOUNTER — Encounter (INDEPENDENT_AMBULATORY_CARE_PROVIDER_SITE_OTHER): Payer: Self-pay

## 2018-12-20 DIAGNOSIS — I10 Essential (primary) hypertension: Secondary | ICD-10-CM | POA: Diagnosis not present

## 2018-12-20 DIAGNOSIS — E669 Obesity, unspecified: Secondary | ICD-10-CM | POA: Diagnosis not present

## 2018-12-22 LAB — BASIC METABOLIC PANEL
BUN/Creatinine Ratio: 18 (ref 12–28)
BUN: 12 mg/dL (ref 8–27)
CO2: 18 mmol/L — ABNORMAL LOW (ref 20–29)
Calcium: 9.5 mg/dL (ref 8.7–10.3)
Chloride: 107 mmol/L — ABNORMAL HIGH (ref 96–106)
Creatinine, Ser: 0.67 mg/dL (ref 0.57–1.00)
GFR calc Af Amer: 109 mL/min/{1.73_m2} (ref >59)
GFR calc non Af Amer: 95 mL/min/{1.73_m2} (ref >59)
Glucose: 102 mg/dL — ABNORMAL HIGH (ref 65–99)
Potassium: 3.3 mmol/L — ABNORMAL LOW (ref 3.5–5.2)
Sodium: 145 mmol/L — ABNORMAL HIGH (ref 134–144)

## 2019-01-03 ENCOUNTER — Other Ambulatory Visit: Payer: Self-pay | Admitting: Family Medicine

## 2019-01-03 DIAGNOSIS — I10 Essential (primary) hypertension: Secondary | ICD-10-CM

## 2019-01-03 DIAGNOSIS — J301 Allergic rhinitis due to pollen: Secondary | ICD-10-CM

## 2019-02-02 ENCOUNTER — Ambulatory Visit: Payer: Medicare Other | Admitting: Family Medicine

## 2019-02-22 ENCOUNTER — Other Ambulatory Visit: Payer: Self-pay | Admitting: Family Medicine

## 2019-03-14 ENCOUNTER — Ambulatory Visit: Payer: Medicare Other | Admitting: Cardiovascular Disease

## 2019-03-16 ENCOUNTER — Other Ambulatory Visit: Payer: Self-pay | Admitting: Family Medicine

## 2019-03-16 DIAGNOSIS — I10 Essential (primary) hypertension: Secondary | ICD-10-CM

## 2019-04-07 ENCOUNTER — Ambulatory Visit: Payer: Medicare Other | Admitting: Cardiovascular Disease

## 2019-05-18 ENCOUNTER — Telehealth: Payer: Self-pay

## 2019-05-18 NOTE — Telephone Encounter (Signed)
Patient states that she does not need gabapentin because she is not even sure what it is for. Patient states she "pushed the   wrong med by accident."   .Glennie Hawk, CMA

## 2019-05-18 NOTE — Telephone Encounter (Signed)
If gabapentin is not on med list please have patient schedule a follow up visit  Oralia Manis, DO, PGY-3 [facility] 05/18/2019 4:50 PM

## 2019-06-15 ENCOUNTER — Other Ambulatory Visit: Payer: Self-pay | Admitting: *Deleted

## 2019-06-15 DIAGNOSIS — I10 Essential (primary) hypertension: Secondary | ICD-10-CM

## 2019-06-15 LAB — BASIC METABOLIC PANEL
BUN/Creatinine Ratio: 14 (ref 12–28)
BUN: 8 mg/dL (ref 8–27)
CO2: 24 mmol/L (ref 20–29)
Calcium: 9.2 mg/dL (ref 8.7–10.3)
Chloride: 106 mmol/L (ref 96–106)
Creatinine, Ser: 0.57 mg/dL (ref 0.57–1.00)
GFR calc Af Amer: 115 mL/min/{1.73_m2} (ref >59)
GFR calc non Af Amer: 100 mL/min/{1.73_m2} (ref >59)
Glucose: 102 mg/dL — ABNORMAL HIGH (ref 65–99)
Potassium: 3.8 mmol/L (ref 3.5–5.2)
Sodium: 144 mmol/L (ref 134–144)

## 2019-06-15 LAB — MAGNESIUM: Magnesium: 1.9 mg/dL (ref 1.6–2.3)

## 2019-06-15 LAB — ALDOSTERONE + RENIN ACTIVITY W/ RATIO
ALDOS/RENIN RATIO: <4 (ref 0.0–30.0)
ALDOSTERONE: <1 ng/dL (ref 0.0–30.0)
Renin: 0.248 ng/mL/hr (ref 0.167–5.380)

## 2019-06-15 MED ORDER — LISINOPRIL 40 MG PO TABS: 40.0000 mg | ORAL_TABLET | Freq: Every day | ORAL | 3 refills | Status: DC

## 2019-06-16 ENCOUNTER — Other Ambulatory Visit: Payer: Self-pay | Admitting: *Deleted

## 2019-06-16 DIAGNOSIS — I10 Essential (primary) hypertension: Secondary | ICD-10-CM

## 2019-06-16 MED ORDER — LISINOPRIL 40 MG PO TABS: 40.0000 mg | ORAL_TABLET | Freq: Every day | ORAL | 3 refills | Status: DC

## 2019-06-20 ENCOUNTER — Other Ambulatory Visit: Payer: Self-pay | Admitting: *Deleted

## 2019-06-20 DIAGNOSIS — I10 Essential (primary) hypertension: Secondary | ICD-10-CM

## 2019-06-20 MED ORDER — LISINOPRIL 40 MG PO TABS: 40.0000 mg | ORAL_TABLET | Freq: Every day | ORAL | 3 refills | Status: DC

## 2019-06-22 ENCOUNTER — Telehealth: Payer: Self-pay | Admitting: Family Medicine

## 2019-06-22 NOTE — Telephone Encounter (Signed)
Attempted to call patient, no response. Left VM to call office back.   If patient calls please give her the below information:   We recently received a message from [facility] identifying a calculation error in the recent blood work he got calculating the renin activity.  Because this was an error from [facility], they have offered you to have repeat labs at no charge.  If you are willing to have repeat labs please call the family medicine center to have a lab appointment where we can redraw these.  I apologize for any inconvenience this may cause.  I will also send a letter with this information.   Orpah Clinton, PGY-3 [facility] 06/22/2019 8:37 AM

## 2019-06-27 ENCOUNTER — Other Ambulatory Visit: Payer: Self-pay | Admitting: Family Medicine

## 2019-06-27 DIAGNOSIS — I10 Essential (primary) hypertension: Secondary | ICD-10-CM

## 2019-06-27 NOTE — Telephone Encounter (Signed)
Pt informed, she will come tomorrow.  Jone Baseman, CMA

## 2019-06-27 NOTE — Telephone Encounter (Signed)
Future orders for labs placed. Please do not charge patient as [facility] has agreed to redraw labs at no cost to patient.   Will route message to Etta in lab as well.   Orpah Clinton, PGY-3 [facility] 06/27/2019 3:36 PM

## 2019-06-28 ENCOUNTER — Other Ambulatory Visit: Payer: Medicare Other

## 2019-07-05 ENCOUNTER — Other Ambulatory Visit: Payer: Medicare Other

## 2019-07-05 ENCOUNTER — Other Ambulatory Visit: Payer: Self-pay

## 2019-07-05 ENCOUNTER — Other Ambulatory Visit: Payer: Self-pay | Admitting: Family Medicine

## 2019-07-05 ENCOUNTER — Telehealth: Payer: Self-pay

## 2019-07-05 ENCOUNTER — Ambulatory Visit: Payer: Medicare Other

## 2019-07-05 DIAGNOSIS — I10 Essential (primary) hypertension: Secondary | ICD-10-CM

## 2019-07-05 NOTE — Telephone Encounter (Signed)
Patient presents in nurse clinic for Tdap Vaccine, however was mis-scheduled. The patient has medicare and unless there is a medical emergency reason, she will have to pay for vaccine to be administered here at [facility]. Can we send this to her pharmacy? Thanks!

## 2019-07-05 NOTE — Telephone Encounter (Signed)
Vaccine ordered, patient can go to pharmacy to obtain this  Oralia Manis, DO, PGY-3 [facility] 07/05/2019 7:26 PM

## 2019-07-06 NOTE — Telephone Encounter (Signed)
Informed patient that she can obtain her TDAP at the pharmacy.  Glennie Hawk, CMA

## 2019-07-12 LAB — ALDOSTERONE + RENIN ACTIVITY W/ RATIO
ALDOSTERONE: <1 ng/dL (ref 0.0–30.0)
Renin: 14.632 ng/mL/hr — ABNORMAL HIGH (ref 0.167–5.380)

## 2019-07-14 ENCOUNTER — Telehealth: Payer: Self-pay | Admitting: *Deleted

## 2019-07-14 NOTE — Telephone Encounter (Signed)
-----   Message from Oralia Manis, DO sent at 07/11/2019  5:16 PM EDT ----- Please inform patient that results of labs show that patient have not changed plan. Although Renin is elevated, a primary aldosteronism is unlikley.

## 2019-07-14 NOTE — Telephone Encounter (Signed)
LMOVM informing pt. Samari Gorby, CMA  

## 2019-08-01 ENCOUNTER — Other Ambulatory Visit: Payer: Self-pay | Admitting: Family Medicine

## 2019-08-01 DIAGNOSIS — J301 Allergic rhinitis due to pollen: Secondary | ICD-10-CM

## 2019-11-04 ENCOUNTER — Ambulatory Visit: Payer: Medicare Other | Admitting: Family Medicine

## 2019-11-10 ENCOUNTER — Other Ambulatory Visit: Payer: Self-pay

## 2019-11-10 ENCOUNTER — Encounter: Payer: Self-pay | Admitting: Family Medicine

## 2019-11-10 ENCOUNTER — Ambulatory Visit (INDEPENDENT_AMBULATORY_CARE_PROVIDER_SITE_OTHER): Payer: Medicare Other | Admitting: Family Medicine

## 2019-11-10 VITALS — BP 122/75 | HR 78 | Ht 62.0 in | Wt 176.8 lb

## 2019-11-10 DIAGNOSIS — R7303 Prediabetes: Secondary | ICD-10-CM

## 2019-11-10 DIAGNOSIS — M545 Low back pain, unspecified: Secondary | ICD-10-CM

## 2019-11-10 DIAGNOSIS — E785 Hyperlipidemia, unspecified: Secondary | ICD-10-CM | POA: Diagnosis not present

## 2019-11-10 DIAGNOSIS — E669 Obesity, unspecified: Secondary | ICD-10-CM | POA: Diagnosis not present

## 2019-11-10 DIAGNOSIS — R1013 Epigastric pain: Secondary | ICD-10-CM | POA: Diagnosis not present

## 2019-11-10 DIAGNOSIS — M549 Dorsalgia, unspecified: Secondary | ICD-10-CM | POA: Insufficient documentation

## 2019-11-10 DIAGNOSIS — E1169 Type 2 diabetes mellitus with other specified complication: Secondary | ICD-10-CM

## 2019-11-10 DIAGNOSIS — G8929 Other chronic pain: Secondary | ICD-10-CM | POA: Insufficient documentation

## 2019-11-10 DIAGNOSIS — M4726 Other spondylosis with radiculopathy, lumbar region: Secondary | ICD-10-CM

## 2019-11-10 DIAGNOSIS — I10 Essential (primary) hypertension: Secondary | ICD-10-CM | POA: Diagnosis not present

## 2019-11-10 LAB — POCT GLYCOSYLATED HEMOGLOBIN (HGB A1C): Hemoglobin A1C: 5.6 % (ref 4.0–5.6)

## 2019-11-10 NOTE — Assessment & Plan Note (Signed)
In the setting of previous lumbar fusion in 2016.  Reassuringly neurologically intact with unremarkable MSK exam.  Encouraged adding Tylenol daily to decrease Advil, heat PRN, and provided with lumbar exercises.  Follow-up if not improving with these measures, can consider formal physical therapy.

## 2019-11-10 NOTE — Progress Notes (Addendum)
SUBJECTIVE:   CHIEF COMPLAINT / HPI: Meet new pcp  Cynthia Hess is a 64 year old female presenting to meet her new provider and discuss the following:  Dyspepsia: Reports epigastric abdominal pain that will burn up into her chest for the past 8-9 months.  Burning in nature.  Notices only after eating certain foods, not sure which ones.  Only occurs about 1-2 times monthly.  No pain associated with activity, walks at the park on a daily basis without concern.  Has not tried anything.  Not associated with any fever, unexpected weight loss, dysphagia/odynophagia, melena/hematochezia, dyspnea, early satiety/fullness, or diaphoresis.  Does use NSAIDs for her back pain as below.  No personal or family history of gastric cancer.  Back pain: Present for years, has not progressed.  Throbbing in her central lumbar spine.  Previous spinal fusion by Dr. Yetta Barre of neurosurgery in 2016.  Only using Advil with some relief, has not tried anything else.  Does limit doing her walking routine some days, can do all ADLs.  No associated extremity weakness, numbness/tingling, bowel/bladder changes, or saddle anesthesia.  Hypertension: Stop taking her spironolactone.  Does not check her BP at home with her cuff because she thinks it is not working correctly.  Still taking lisinopril   Health maintenance: Due for colonoscopy, Tdap, Pap smear, pneumococcal vaccine   PERTINENT  PMH / PSH: Hypertension, history of cervical neuropathy, lumbar pain s/p spinal fusion, tobacco use  OBJECTIVE:   BP 122/75   Pulse 78   Ht 5\' 2"  (1.575 m)   Wt 176 lb 12.8 oz (80.2 kg)   SpO2 99%   BMI 32.34 kg/m   General: Alert, NAD HEENT: NCAT, MMM Cardiac: RRR  Lungs: Clear bilaterally, no increased WOB  Abdomen: soft, mild tenderness to palpation of epigastrium, otherwise nontender throughout.  No hepatosplenomegaly palpated.  Normoactive bowel sounds, nondistended. Msk: Lumbar spine: - Inspection: no gross deformity or asymmetry,  swelling or ecchymosis, midline incision over ~L4 and down.  - Palpation: Mild TTP over the lumbar spinous processes over previous incision, but no TTP to paraspinal muscles, or SI joints b/l - ROM: full active ROM of the lumbar spine in flexion and extension without pain - Strength: 5/5 strength of lower extremity in L4-S1 nerve root distributions b/l; normal gait - Neuro: sensation intact in the L4-S1 nerve root distribution b/l, 2+ L4 reflexes - Special testing: Negative straight leg raise Ext: Warm, dry, 2+ distal pulses, no edema bilaterally  ASSESSMENT/PLAN:   Dyspepsia Present since 2020.  Clinical presentation suggestive of GERD, gastritis/PUD, vs medication adverse effect as she often takes NSAIDs.  However given so sporadic, suspect more reflux related.  Regardless, will rule out underlying H. pylori today prior to starting H2/PPI.  Encouraged keeping a food diary to assess for triggers and decreasing amount of NSAIDs as discussed below.  Tums/Gaviscon as needed, can also trial Pepcid if not helping. Due for routine screening colonoscopy already, will consider full evaluation to GI for additional EGD given new symptoms > age 63, no other red flags.   Chronic back pain In the setting of previous lumbar fusion in 2016.  Reassuringly neurologically intact with unremarkable MSK exam.  Encouraged adding Tylenol daily to decrease Advil, heat PRN, and provided with lumbar exercises.  Follow-up if not improving with these measures, can consider formal physical therapy.  Hyperlipidemia Repeat lipid panel today.  On atorvastatin for primary prevention.  Obesity (BMI 30-39.9) Weight stable.  Will obtain screening A1c, previously in prediabetic range.  Primary hypertension Well-controlled today on lisinopril only.  Check BMP.  Follow-up in 1-2 weeks for RN visit and bring BP cuff to compare, can consider adding back spironolactone if elevated.    Follow-up in 1-2 weeks for RN visit, otherwise  in 1 month to check in for above.  Allayne Stack, DO  [facility]

## 2019-11-10 NOTE — Assessment & Plan Note (Addendum)
Present since 2020.  Clinical presentation suggestive of GERD, gastritis/PUD, vs medication adverse effect as she often takes NSAIDs.  However given so sporadic, suspect more reflux related.  Regardless, will rule out underlying H. pylori today prior to starting H2/PPI.  Encouraged keeping a food diary to assess for triggers and decreasing amount of NSAIDs as discussed below.  Tums/Gaviscon as needed, can also trial Pepcid if not helping. Due for routine screening colonoscopy already, will consider full evaluation to GI for additional EGD given new symptoms > age 89, no other red flags.

## 2019-11-10 NOTE — Assessment & Plan Note (Signed)
Repeat lipid panel today.  On atorvastatin for primary prevention.

## 2019-11-10 NOTE — Assessment & Plan Note (Signed)
Weight stable.  Will obtain screening A1c, previously in prediabetic range.

## 2019-11-10 NOTE — Patient Instructions (Signed)
It was wonderful meeting you today.  We are checking some labs today, though should be back in the next few days.  For your back: I provided some exercises.  I also encourage you to use heat 20 minutes at a time several times a day and add Tylenol 650 mg every 4-6 hours in addition to your Advil to help with pain.  We can also consider doing formal physical therapy in the future.  For your belly pains: Please start keeping a journal on which foods seem to irritate your stomach and avoid as possible.  When it does occur, you can always try Tums or Gaviscon to help with antiacid relief.  We are going to check for a bacteria called H. pylori today, I will call you this results in a few days.  You can also try a medicine called Pepcid (famotidine) which you can find at your local pharmacy.  Lastly, I recommend in the next 1-2 weeks to make a nurses visit and bring in your blood pressure cuff so we can compare and recheck your blood pressure.

## 2019-11-10 NOTE — Assessment & Plan Note (Signed)
Well-controlled today on lisinopril only.  Check BMP.  Follow-up in 1-2 weeks for RN visit and bring BP cuff to compare, can consider adding back spironolactone if elevated.

## 2019-11-11 LAB — BASIC METABOLIC PANEL
BUN/Creatinine Ratio: 19 (ref 12–28)
BUN: 10 mg/dL (ref 8–27)
CO2: 24 mmol/L (ref 20–29)
Calcium: 9.1 mg/dL (ref 8.7–10.3)
Chloride: 107 mmol/L — ABNORMAL HIGH (ref 96–106)
Creatinine, Ser: 0.52 mg/dL — ABNORMAL LOW (ref 0.57–1.00)
GFR calc Af Amer: 118 mL/min/{1.73_m2} (ref >59)
GFR calc non Af Amer: 102 mL/min/{1.73_m2} (ref >59)
Glucose: 79 mg/dL (ref 65–99)
Potassium: 3.7 mmol/L (ref 3.5–5.2)
Sodium: 142 mmol/L (ref 134–144)

## 2019-11-11 LAB — LIPID PANEL
Chol/HDL Ratio: 2.7 ratio (ref 0.0–4.4)
Cholesterol, Total: 133 mg/dL (ref 100–199)
HDL: 49 mg/dL (ref >39)
LDL Chol Calc (NIH): 61 mg/dL (ref 0–99)
Triglycerides: 134 mg/dL (ref 0–149)
VLDL Cholesterol Cal: 23 mg/dL (ref 5–40)

## 2019-11-14 ENCOUNTER — Other Ambulatory Visit: Payer: Self-pay | Admitting: Family Medicine

## 2019-11-14 ENCOUNTER — Encounter: Payer: Self-pay | Admitting: Family Medicine

## 2019-11-14 DIAGNOSIS — Z1231 Encounter for screening mammogram for malignant neoplasm of breast: Secondary | ICD-10-CM

## 2019-11-14 DIAGNOSIS — Z1239 Encounter for other screening for malignant neoplasm of breast: Secondary | ICD-10-CM

## 2019-11-15 ENCOUNTER — Ambulatory Visit: Payer: Medicare Other

## 2019-11-28 ENCOUNTER — Ambulatory Visit: Payer: Medicare Other

## 2019-11-29 ENCOUNTER — Other Ambulatory Visit: Payer: Medicare Other

## 2019-11-29 ENCOUNTER — Ambulatory Visit: Payer: Medicare Other

## 2019-12-01 ENCOUNTER — Ambulatory Visit: Payer: Medicare Other

## 2019-12-01 ENCOUNTER — Other Ambulatory Visit: Payer: Medicare Other

## 2020-01-25 ENCOUNTER — Telehealth: Payer: Self-pay | Admitting: Family Medicine

## 2020-01-25 NOTE — Telephone Encounter (Signed)
Called patient to schedule AWV, if patient calls back  Please assist in scheduling, any questions please ask me. Thanks

## 2020-02-28 ENCOUNTER — Other Ambulatory Visit: Payer: Self-pay | Admitting: *Deleted

## 2020-02-28 DIAGNOSIS — I10 Essential (primary) hypertension: Secondary | ICD-10-CM

## 2020-02-28 MED ORDER — LISINOPRIL 40 MG PO TABS: 40.0000 mg | ORAL_TABLET | Freq: Every day | ORAL | 2 refills | Status: DC

## 2020-02-28 MED ORDER — ATORVASTATIN CALCIUM 40 MG PO TABS: 40.0000 mg | ORAL_TABLET | Freq: Every day | ORAL | 3 refills | Status: DC

## 2020-03-07 ENCOUNTER — Other Ambulatory Visit: Payer: Self-pay | Admitting: *Deleted

## 2020-03-07 DIAGNOSIS — I10 Essential (primary) hypertension: Secondary | ICD-10-CM

## 2020-04-03 ENCOUNTER — Ambulatory Visit (INDEPENDENT_AMBULATORY_CARE_PROVIDER_SITE_OTHER): Payer: Medicare HMO | Admitting: Student in an Organized Health Care Education/Training Program

## 2020-04-03 ENCOUNTER — Other Ambulatory Visit: Payer: Self-pay

## 2020-04-03 DIAGNOSIS — L239 Allergic contact dermatitis, unspecified cause: Secondary | ICD-10-CM | POA: Insufficient documentation

## 2020-04-03 MED ORDER — TRIAMCINOLONE ACETONIDE 0.1 % EX OINT
1.0000 | TOPICAL_OINTMENT | Freq: Two times a day (BID) | CUTANEOUS | 0 refills | Status: DC
Start: 2020-04-03 — End: 2022-06-10

## 2020-04-03 MED ORDER — PREDNISONE 20 MG PO TABS: 40.0000 mg | ORAL_TABLET | Freq: Every day | ORAL | 0 refills | Status: DC

## 2020-04-03 MED ORDER — TRIAMCINOLONE ACETONIDE 0.1 % EX OINT: 1.0000 "application " | TOPICAL_OINTMENT | Freq: Two times a day (BID) | CUTANEOUS | 0 refills | Status: DC

## 2020-04-03 MED ORDER — PREDNISONE 20 MG PO TABS: 40.0000 mg | ORAL_TABLET | Freq: Every day | ORAL | 0 refills | Status: AC

## 2020-04-03 NOTE — Assessment & Plan Note (Addendum)
Given severity of symptoms and location of rash around face, have prescribed 40mg  prednisone PO x5 days as well as topical triamcinolone for worst locations of behind ears and nape of neck. Instructed patient to not use steroid cream around eyes and limit on face. Recommended she avoid scratching to prevent secondary infection. Can still take benadryl as needed If not improving by the end of the week, come back.

## 2020-04-03 NOTE — Patient Instructions (Addendum)
It was a pleasure to see you today!  To summarize our discussion for this visit:  You seem to have some bad allergic reaction to the hair dye. Besides stopping the use of that dye, we can treat the itchiness and irritation left behind. Because of the location of the rash, I think the best thing to do would be take oral steroids for a few days. You can continue taking benadryl. I'll also prescribe a topical steroid for the base of your hairline and behind your ears where it looks the worst but please do not put on your face or near your eyes.   Come back and let us know if you have any worsening or if it's not improving by the end of the week.  Be careful not to itch the rash because that can lead to a secondary infection on top of the allergic rash  Some additional health maintenance measures we should update are: Health Maintenance Due  Topic Date Due  . PNEUMOCOCCAL POLYSACCHARIDE VACCINE AGE 10-64 HIGH RISK  Never done  . COVID-19 Vaccine (1) Never done  . COLONOSCOPY (Pts 45-47yrs Insurance coverage will need to be confirmed)  Never done  . MAMMOGRAM  11/01/2015  . TETANUS/TDAP  04/21/2017  . PAP SMEAR-Modifier  05/20/2019  . INFLUENZA VACCINE  10/30/2019  .    Call the clinic at [phone] if your symptoms worsen or you have any concerns.   Thank you for allowing me to take part in your care,  Dr. Jamelle Rushing

## 2020-04-03 NOTE — Progress Notes (Signed)
    SUBJECTIVE:   CHIEF COMPLAINT / HPI: hair dye reaction  Dyed her hair on Sunday (2.5 days ago) and began feeling itching and burning. Removed the dye but continued to have a pruritic rash on scalp. Has tried benadryl which has helped some but not resolved. She has been scratching excessively and irritation is interfering with sleep. Has not had any fever or rash elsewhere. Has not noticed any swollen lymph nodes, trouble swallowing or breathing.  Has not had this occur before.  OBJECTIVE:   BP (!) 148/80   Pulse 90   SpO2 98%   General: NAD, pleasant, able to participate in exam Skin:  Irritated erythematous, raised skin in distribution of hair and on close areas such as posterior ears. Worse post-auricular and nape of neck. Some mild clear drainage and crust. Apparent excoriations to hairline.  warm and dry elsewhere Neuro: alert and oriented, no focal deficits Psych: Normal affect and mood, mildly agitated/irritated with the itchiness of the rash  ASSESSMENT/PLAN:   Allergic dermatitis Given severity of symptoms and location of rash around face, have prescribed 40mg  prednisone PO x5 days as well as topical triamcinolone for worst locations of behind ears and nape of neck. Instructed patient to not use steroid cream around eyes and limit on face. Recommended she avoid scratching to prevent secondary infection. Can still take benadryl as needed If not improving by the end of the week, come back.     , DO [facility]

## 2020-04-12 ENCOUNTER — Telehealth: Payer: Self-pay

## 2020-04-12 NOTE — Telephone Encounter (Signed)
Patient calls nurse line reporting minimal improvement from office visit regarding her scalp. Patient reports the triamcinolone cream made the area worse, and now her hair is falling out. Patient denies fevers, but still has continued pruritic rash on scalp. Apt scheduled for tomorrow.

## 2020-04-13 ENCOUNTER — Ambulatory Visit: Payer: Medicare HMO | Admitting: Family Medicine

## 2020-04-17 ENCOUNTER — Ambulatory Visit: Payer: Medicare HMO | Admitting: Family Medicine

## 2020-06-15 ENCOUNTER — Other Ambulatory Visit: Payer: Self-pay

## 2020-06-15 DIAGNOSIS — J301 Allergic rhinitis due to pollen: Secondary | ICD-10-CM

## 2020-06-15 MED ORDER — FLUTICASONE PROPIONATE 50 MCG/ACT NA SUSP: 2.0000 | Freq: Every day | NASAL | 2 refills | Status: DC

## 2020-08-01 ENCOUNTER — Ambulatory Visit: Payer: Medicare HMO

## 2020-08-24 ENCOUNTER — Other Ambulatory Visit: Payer: Self-pay

## 2020-08-24 DIAGNOSIS — I10 Essential (primary) hypertension: Secondary | ICD-10-CM

## 2020-08-24 MED ORDER — ATORVASTATIN CALCIUM 40 MG PO TABS: 40.0000 mg | ORAL_TABLET | Freq: Every day | ORAL | 3 refills | Status: DC

## 2020-08-24 MED ORDER — LISINOPRIL 40 MG PO TABS: 40.0000 mg | ORAL_TABLET | Freq: Every day | ORAL | 2 refills | Status: DC

## 2020-09-25 ENCOUNTER — Ambulatory Visit (INDEPENDENT_AMBULATORY_CARE_PROVIDER_SITE_OTHER): Payer: Medicare HMO

## 2020-09-25 VITALS — Ht 62.0 in | Wt 172.0 lb

## 2020-09-25 DIAGNOSIS — Z0000 Encounter for general adult medical examination without abnormal findings: Secondary | ICD-10-CM | POA: Diagnosis not present

## 2020-09-25 NOTE — Patient Instructions (Addendum)
You spoke to Cynthia Hess, Cynthia Hess over the phone for your annual wellness visit.  We discussed goals:   Goals      Quit Smoking     Current everyday smoker 1PPD        We also discussed recommended health maintenance. As discussed, you are due for:  Health Maintenance  Topic Date Due   COVID-19 Vaccine (1) Never done   Pneumococcal Vaccine 18-64 Years old (1 - PCV) Never done   Zoster Vaccines- Shingrix (1 of 2) Never done   COLONOSCOPY (Pts 45-34yrs Insurance coverage will need to be confirmed)  Never done   MAMMOGRAM  11/01/2015   TETANUS/TDAP  04/21/2017   PAP SMEAR-Modifier  05/20/2019   INFLUENZA VACCINE  10/29/2020   Hepatitis C Screening  Completed   HIV Screening  Completed   HPV VACCINES  Aged Out   PCP apt scheduled for 7/18 $Remove'@10'[id number]$ :10am. Consider Covid Vaccine- discussed in detail. Call [facility] to schedule your mammogram.  Ask for an advance directive packet at next visit- discussed in detail.   We also discussed smoking cessation.  1-800-QUIT-NOW  Preventive Care 76-47 Years Old, Female Preventive care refers to lifestyle choices and visits with your health care provider that can promote health and wellness. This includes: A yearly physical exam. This is also called an annual wellness visit. Regular dental and eye exams. Immunizations. Screening for certain conditions. Healthy lifestyle choices, such as: Eating a healthy diet. Getting regular exercise. Not using drugs or products that contain nicotine and tobacco. Limiting alcohol use. What can I expect for my preventive care visit? Physical exam Your health care provider will check your: Height and weight. These may be used to calculate your BMI (body mass index). BMI is a measurement that tells if you are at a healthy weight. Heart rate and blood pressure. Body temperature. Skin for abnormal spots. Counseling Your health care provider may ask you questions about your: Past medical  problems. Family's medical history. Alcohol, tobacco, and drug use. Emotional well-being. Home life and relationship well-being. Sexual activity. Diet, exercise, and sleep habits. Work and work Statistician. Access to firearms. Method of birth control. Menstrual cycle. Pregnancy history. What immunizations do I need?  Vaccines are usually given at various ages, according to a schedule. Your health care provider will recommend vaccines for you based on your age, medicalhistory, and lifestyle or other factors, such as travel or where you work. What tests do I need? Blood tests Lipid and cholesterol levels. These may be checked every 5 years, or more often if you are over 4 years old. Hepatitis C test. Hepatitis B test. Screening Lung cancer screening. You may have this screening every year starting at age 16 if you have a 30-pack-year history of smoking and currently smoke or have quit within the past 15 years. Colorectal cancer screening. All adults should have this screening starting at age 21 and continuing until age 50. Your health care provider may recommend screening at age 6 if you are at increased risk. You will have tests every 1-10 years, depending on your results and the type of screening test. Diabetes screening. This is done by checking your blood sugar (glucose) after you have not eaten for a while (fasting). You may have this done every 1-3 years. Mammogram. This may be done every 1-2 years. Talk with your health care provider about when you should start having regular mammograms. This may depend on whether you have a family history of breast  cancer. BRCA-related cancer screening. This may be done if you have a family history of breast, ovarian, tubal, or peritoneal cancers. Pelvic exam and Pap test. This may be done every 3 years starting at age 16. Starting at age 31, this may be done every 5 years if you have a Pap test in combination with an HPV test. Other  tests STD (sexually transmitted disease) testing, if you are at risk. Bone density scan. This is done to screen for osteoporosis. You may have this scan if you are at high risk for osteoporosis. Talk with your health care provider about your test results, treatment options,and if necessary, the need for more tests. Follow these instructions at home: Eating and drinking  Eat a diet that includes fresh fruits and vegetables, whole grains, lean protein, and low-fat dairy products. Take vitamin and mineral supplements as recommended by your health care provider. Do not drink alcohol if: Your health care provider tells you not to drink. You are pregnant, may be pregnant, or are planning to become pregnant. If you drink alcohol: Limit how much you have to 0-1 drink a day. Be aware of how much alcohol is in your drink. In the [place] one drink equals one 12 oz bottle of beer (355 mL), one 5 oz glass of wine (148 mL), or one 1 oz glass of hard liquor (44 mL).  Lifestyle Take daily care of your teeth and gums. Brush your teeth every morning and night with fluoride toothpaste. Floss one time each day. Stay active. Exercise for at least 30 minutes 5 or more days each week. Do not use any products that contain nicotine or tobacco, such as cigarettes, e-cigarettes, and chewing tobacco. If you need help quitting, ask your health care provider. Do not use drugs. If you are sexually active, practice safe sex. Use a condom or other form of protection to prevent STIs (sexually transmitted infections). If you do not wish to become pregnant, use a form of birth control. If you plan to become pregnant, see your health care provider for a prepregnancy visit. If told by your health care provider, take low-dose aspirin daily starting at age 65. Find healthy ways to cope with stress, such as: Meditation, yoga, or listening to music. Journaling. Talking to a trusted person. Spending time with friends and  family. Safety Always wear your seat belt while driving or riding in a vehicle. Do not drive: If you have been drinking alcohol. Do not ride with someone who has been drinking. When you are tired or distracted. While texting. Wear a helmet and other protective equipment during sports activities. If you have firearms in your house, make sure you follow all gun safety procedures. What's next? Visit your health care provider once a year for an annual wellness visit. Ask your health care provider how often you should have your eyes and teeth checked. Stay up to date on all vaccines. This information is not intended to replace advice given to you by your health care provider. Make sure you discuss any questions you have with your healthcare provider. Document Revised: 12/20/2019 Document Reviewed: 11/26/2017 Elsevier Patient Education  2022 [place].  Colonoscopy, Adult A colonoscopy is a procedure to look at the entire large intestine. This procedure is done using a long, thin, flexible tube that has a camera on theend. You may have a colonoscopy: As a part of normal colorectal screening. If you have certain symptoms, such as: A low number of red blood cells in your  blood (anemia). Diarrhea that does not go away. Pain in your abdomen. Blood in your stool. A colonoscopy can help screen for and diagnose medical problems, including: Tumors. Extra tissue that grows where mucus forms (polyps). Inflammation. Areas of bleeding. Tell your health care provider about: Any allergies you have. All medicines you are taking, including vitamins, herbs, eye drops, creams, and over-the-counter medicines. Any problems you or family members have had with anesthetic medicines. Any blood disorders you have. Any surgeries you have had. Any medical conditions you have. Any problems you have had with having bowel movements. Whether you are pregnant or may be pregnant. What are the risks? Generally,  this is a safe procedure. However, problems may occur, including: Bleeding. Damage to your intestine. Allergic reactions to medicines given during the procedure. Infection. This is rare. What happens before the procedure? Eating and drinking restrictions Follow instructions from your health care provider about eating or drinking restrictions, which may include: A few days before the procedure: Follow a low-fiber diet. Avoid nuts, seeds, dried fruit, raw fruits, and vegetables. 1-3 days before the procedure: Eat only gelatin dessert or ice pops. Drink only clear liquids, such as water, clear juice, clear broth or bouillon, black coffee or tea, or clear soft drinks or sports drinks. Avoid liquids that contain red or purple dye. The day of the procedure: Do not eat solid foods. You may continue to drink clear liquids until up to 2 hours before the procedure. Do not eat or drink anything starting 2 hours before the procedure, or within the time period that your health care provider recommends. Bowel prep If you were prescribed a bowel prep to take by mouth (orally) to clean out your colon: Take it as told by your health care provider. Starting the day before your procedure, you will need to drink a large amount of liquid medicine. The liquid will cause you to have many bowel movements of loose stool until your stool becomes almost clear or light green. If your skin or the opening between the buttocks (anus) gets irritated from diarrhea, you may relieve the irritation using: Wipes with medicine in them, such as adult wet wipes with aloe and vitamin E. A product to soothe skin, such as petroleum jelly. If you vomit while drinking the bowel prep: Take a break for up to 60 minutes. Begin the bowel prep again. Call your health care provider if you keep vomiting or you cannot take the bowel prep without vomiting. To clean out your colon, you may also be given: Laxative medicines. These help you  have a bowel movement. Instructions for enema use. An enema is liquid medicine injected into your rectum. Medicines Ask your health care provider about: Changing or stopping your regular medicines or supplements. This is especially important if you are taking iron supplements, diabetes medicines, or blood thinners. Taking medicines such as aspirin and ibuprofen. These medicines can thin your blood. Do not take these medicines unless your health care provider tells you to take them. Taking over-the-counter medicines, vitamins, herbs, and supplements. General instructions Ask your health care provider what steps will be taken to help prevent infection. These may include washing skin with a germ-killing soap. Plan to have someone take you home from the hospital or clinic. What happens during the procedure?  An IV will be inserted into one of your veins. You may be given one or more of the following: A medicine to help you relax (sedative). A medicine to numb the area (local  anesthetic). A medicine to make you fall asleep (general anesthetic). This is rarely needed. You will lie on your side with your knees bent. The tube will: Have oil or gel put on it (be lubricated). Be inserted into your anus. Be gently eased through all parts of your large intestine. Air will be sent into your colon to keep it open. This may cause some pressure or cramping. Images will be taken with the camera and will appear on a screen. A small tissue sample may be removed to be looked at under a microscope (biopsy). The tissue may be sent to a lab for testing if any signs of problems are found. If small polyps are found, they may be removed and checked for cancer cells. When the procedure is finished, the tube will be removed. The procedure may vary among health care providers and hospitals. What happens after the procedure? Your blood pressure, heart rate, breathing rate, and blood oxygen level will be monitored  until you leave the hospital or clinic. You may have a small amount of blood in your stool. You may pass gas and have mild cramping or bloating in your abdomen. This is caused by the air that was used to open your colon during the exam. Do not drive for 24 hours after the procedure. It is up to you to get the results of your procedure. Ask your health care provider, or the department that is doing the procedure, when your results will be ready. Summary A colonoscopy is a procedure to look at the entire large intestine. Follow instructions from your health care provider about eating and drinking before the procedure. If you were prescribed an oral bowel prep to clean out your colon, take it as told by your health care provider. During the colonoscopy, a flexible tube with a camera on its end is inserted into the anus and then passed into the other parts of the large intestine. This information is not intended to replace advice given to you by your health care provider. Make sure you discuss any questions you have with your healthcare provider. Document Revised: 10/08/2018 Document Reviewed: 10/08/2018 Elsevier Patient Education  2022 [employer].  Our clinic's number is [phone]. Please call with questions or concerns about what we discussed today.

## 2020-09-25 NOTE — Progress Notes (Addendum)
Subjective:   Cynthia Hess is a 65 y.o. female who presents for Medicare Annual (Subsequent) preventive examination.  Patient consented to have virtual visit and was identified by name and date of birth. Method of visit: Telephone  Encounter participants: Patient: Cynthia Hess - located at Home Nurse/Provider: Steva Colder - located at [facility] Others (if applicable): NA  Review of Systems: Defer to PCP.  Cardiac Risk Factors include: sedentary lifestyle;smoking/ tobacco exposure;obesity (BMI >30kg/m2);hypertension  Objective:   Vitals: Ht 5\' 2"  (1.575 m)   Wt 172 lb (78 kg) Comment: patient reported  BMI 31.46 kg/m   Body mass index is 31.46 kg/m.  Advanced Directives 09/25/2020 04/03/2020 11/22/2018 02/02/2018 04/28/2017 12/29/2016 10/03/2016  Does Patient Have a Medical Advance Directive? No No No No No No No  Would patient like information on creating a medical advance directive? Yes (MAU/Ambulatory/Procedural Areas - Information given) No - Patient declined No - Patient declined No - Patient declined No - Patient declined No - Patient declined No - Patient declined  Pre-existing out of facility DNR order (yellow form or pink MOST form) - - - - - - -   Tobacco Social History   Tobacco Use  Smoking Status Every Day   Packs/day: 1.00   Years: 25.00   Pack years: 25.00   Types: Cigarettes  Smokeless Tobacco Never     Ready to quit: Yes Counseling given: Yes  Clinical Intake:  Pre-visit preparation completed: Yes  Pain Score: 5   How often do you need to have someone help you when you read instructions, pamphlets, or other written materials from your doctor or pharmacy?: 2 - Rarely What is the last grade level you completed in school?: 11th grade  Interpreter Needed?: No  Past Medical History:  Diagnosis Date   Allergic rhinitis 10/03/2016   Arthritis    in low back   Asthma 2009   CLOSED FRACTURE OF METATARSAL BONE 12/29/2008   Annotation: minimally displaced  fracture through the fifth metatarsal Qualifier: Diagnosis of  By: 02/28/2009 FNP, Nykedtra     COSTOCHONDRITIS 09/04/2008   Qualifier: Diagnosis of  By: 11/04/2008 FNP, Daphine Deutscher Quervain's tenosynovitis, left 10/03/2016   DEGENERATIVE DISC DISEASE, CERVICAL SPINE 10/18/2007   Qualifier: Diagnosis of  By: 10/20/2007 FNP, [facility]     DENTAL CARIES 02/04/2008   Qualifier: Diagnosis of  By: 13/08/2007 FNP, Nykedtra     Depression    meds in the past/ denies 06/16/13   HERNIATED LUMBOSACRAL DISC 05/02/2008   Qualifier: Diagnosis of  By: [place], 06/30/2008     Hyperlipidemia 2013   Hypertension 2009   Left hip pain 11/02/2013   Lumbosacral spondylosis without myelopathy 10/03/2011   Microscopic hematuria 04/22/2007   Qualifier: Diagnosis of  By: 04/24/2007     Mood changes 11/18/2018   NEPHROLITHIASIS 05/21/2007   Annotation: bilateral Qualifier: Diagnosis of  By: 05/23/2007 FNP, Nykedtra     Neuropathy, cervical (radicular) 02/03/2018   Obesity (BMI 30-39.9) 06/03/2013   Osteoarthritis of spine 02/09/2008   Qualifier: Diagnosis of  By: 13/01/2008 FNP, Nykedtra     PAIN IN SOFT TISSUES OF LIMB 04/15/2007   Qualifier: Diagnosis of  By: 04/17/2007 FNP, Nykedtra     Palpitations 11/18/2018   Primary hypertension 09/01/2013   Recurrent incisional hernia with incarceration s/p lap repair w mesh 06/24/2013 05/21/2007   Qualifier: Diagnosis of  By: 05/23/2007 FNP, Nykedtra     S/P hernia repair 06/24/2013   S/P  lumbar spinal fusion 09/29/2014   Sciatica 10/03/2011   SUBACROMIAL BURSITIS, LEFT 10/30/2007   Annotation: per MRI Qualifier: Diagnosis of  By: Daphine Deutscher FNP, Nykedtra     Tobacco use disorder 06/03/2013   TRANSAMINASES, SERUM, ELEVATED 02/19/2010   Qualifier: Diagnosis of  By: Daphine Deutscher FNP, Zena Amos     TRICHOMONIASIS 04/22/2007   Qualifier: History of  By: Daphine Deutscher FNP, Nykedtra     Vaginal dryness 05/19/2016   WRIST PAIN, BILATERAL 01/09/2010   Qualifier: Diagnosis of  By: Daphine Deutscher FNP, Zena Amos     Past Surgical History:  Procedure  Laterality Date   ABDOMINAL HYSTERECTOMY  1989   still gets pap smears   BLADDER REPAIR  1989   ?bladder injury   DILATION AND CURETTAGE OF UTERUS     HERNIA REPAIR  1990s   reports total of 3 surgeries, most recently 2007   INSERTION OF MESH N/A 06/24/2013   Procedure: INSERTION OF MESH;  Surgeon: Ardeth Sportsman, MD;  Location: [facility];  Service: General;  Laterality: N/A;   MAXIMUM ACCESS (MAS)POSTERIOR LUMBAR INTERBODY FUSION (PLIF) 2 LEVEL N/A 09/29/2014   Procedure: FOR MAXIMUM ACCESS (MAS) POSTERIOR LUMBAR INTERBODY FUSION  LUMBAR FOUR-FIVE,LUMBAR FIVE-SACRAL ONE;  Surgeon: Tia Alert, MD;  Location: [facility];  Service: Neurosurgery;  Laterality: N/A;   MULTIPLE TOOTH EXTRACTIONS     SMALL INTESTINE SURGERY  1989   for "blockage"   TONSILLECTOMY  1967   TONSILLECTOMY     TUBAL LIGATION  1979   UMBILICAL HERNIA REPAIR  2005   open w suture   VENTRAL HERNIA REPAIR  2007   open w mesh Dr Zachery Dakins   VENTRAL HERNIA REPAIR N/A 06/24/2013   Procedure: LAPAROSCOPIC VENTRAL WALL HERNIA REPAIR;  Surgeon: Ardeth Sportsman, MD;  Location: [facility];  Service: General;  Laterality: N/A;   Family History  Problem Relation Age of Onset   Early death Mother        when pt was 41; poisoned   Early death Father        when pt was 50, unknown    Asthma Brother    Diabetes Brother    Heart murmur Brother    Hypertension Brother    Alzheimer's disease Maternal Grandmother    Hypertension Brother    Social History   Socioeconomic History   Marital status: Single    Spouse name: Not on file   Number of children: 4   Years of education: 11   Highest education level: 11th grade  Occupational History   Not on file  Tobacco Use   Smoking status: Every Day    Packs/day: 1.00    Years: 25.00    Pack years: 25.00    Types: Cigarettes   Smokeless tobacco: Never  Vaping Use   Vaping Use: Never used  Substance and Sexual Activity   Alcohol use: Yes    Alcohol/week: 3.0 standard drinks     Types: 3 Cans of beer per week   Drug use: Never   Sexual activity: Not Currently    Partners: Male    Birth control/protection: Surgical  Other Topics Concern   Not on file  Social History Narrative   Previous MD was Dr. Philipp Deputy, last seen 6/2013Has notes from Dr. Wynn Banker (chronic pain mgmt)- non-narcotic txt only per most recent note 09/2011 due to UDS + for codeine, butalbital, ethanolCHRONIC BACK PAIN PATIENTIs currently unemployed, finished some high school On disability for her back- had a fall in 2010  Lives with daughter, Tanazia Achee cigarettes and EtOH, denies drugsCurrent meds:Lisinopril 40 qdClonidine 0.1mg  qhs (restarting 08/2013)         09/25/2020   Patient lives alone in [place].    Patient has 4 children who all live in the area. Patient is close with them and sees them often.    Patient enjoys spending time with family and watching TV.   Due to back problems it is hard for patient to exercise, she tries to walk when she can.    Social Determinants of Health   Financial Resource Strain: Low Risk    Difficulty of Paying Living Expenses: Not hard at all  Food Insecurity: No Food Insecurity   Worried About Programme researcher, broadcasting/film/video in the Last Year: Never true   Ran Out of Food in the Last Year: Never true  Transportation Needs: No Transportation Needs   Lack of Transportation (Medical): No   Lack of Transportation (Non-Medical): No  Physical Activity: Inactive   Days of Exercise per Week: 0 days   Minutes of Exercise per Session: 0 min  Stress: Stress Concern Present   Feeling of Stress : Rather much  Social Connections: Socially Isolated   Frequency of Communication with Friends and Family: More than three times a week   Frequency of Social Gatherings with Friends and Family: More than three times a week   Attends Religious Services: Never   Database administrator or Organizations: No   Attends Banker Meetings: Never   Marital Status: Divorced    Outpatient Encounter Medications as of 09/25/2020  Medication Sig   atorvastatin (LIPITOR) 40 MG tablet Take 1 tablet (40 mg total) by mouth daily.   fluticasone (FLONASE) 50 MCG/ACT nasal spray Place 2 sprays into both nostrils daily.   lisinopril (ZESTRIL) 40 MG tablet Take 1 tablet (40 mg total) by mouth daily.   OVER THE COUNTER MEDICATION Take 3 tablets by mouth daily as needed (PAIN). STORE BRAND PAIN RELIEVER   spironolactone (ALDACTONE) 25 MG tablet Take 1 tablet (25 mg total) by mouth daily.   triamcinolone ointment (KENALOG) 0.1 % Apply 1 application topically 2 (two) times daily. (Patient not taking: Reported on 09/25/2020)   No facility-administered encounter medications on file as of 09/25/2020.   Activities of Daily Living In your present state of health, do you have any difficulty performing the following activities: 09/25/2020  Hearing? N  Vision? N  Difficulty concentrating or making decisions? N  Walking or climbing stairs? N  Dressing or bathing? N  Doing errands, shopping? N  Preparing Food and eating ? N  Using the Toilet? N  In the past six months, have you accidently leaked urine? Y  Do you have problems with loss of bowel control? N  Managing your Medications? N  Managing your Finances? N  Housekeeping or managing your Housekeeping? N  Some recent data might be hidden   Patient Care Team: Allayne Stack, DO as PCP - General (Family Medicine) Nahser, Deloris Ping, MD as PCP - Cardiology (Cardiology)    Assessment:   This is a routine wellness examination for [place].  Exercise Activities and Dietary recommendations Current Exercise Habits: The patient does not participate in regular exercise at present, Exercise limited by: orthopedic condition(s)   Goals      Quit Smoking     Current everyday smoker 1PPD        Fall Risk Fall Risk  09/25/2020 04/03/2020 11/22/2018 03/21/2015 09/01/2013  Falls in  the past year? 0 0 0 No No  Number falls in past yr: - 0  - - -  Injury with Fall? - 0 - - -  Follow up Falls prevention discussed - - - -   Is the patient's home free of loose throw rugs in walkways, pet beds, electrical cords, etc?   yes      Grab bars in the bathroom? yes      Handrails on the stairs?   yes      Adequate lighting?   yes  Patient rating of health (0-10) scale: 7  Depression Screen PHQ 2/9 Scores 09/25/2020 04/03/2020 11/10/2019 11/22/2018  PHQ - 2 Score 0 0 2 0  PHQ- 9 Score - 0 3 -    Immunization History  Administered Date(s) Administered   Influenza Whole 12/29/2008   Td 04/22/2007   Screening Tests Health Maintenance  Topic Date Due   COVID-19 Vaccine (1) Never done   Pneumococcal Vaccine 300-64 Years old (1 - PCV) Never done   Zoster Vaccines- Shingrix (1 of 2) Never done   COLONOSCOPY (Pts 45-6827yrs Insurance coverage will need to be confirmed)  Never done   MAMMOGRAM  11/01/2015   TETANUS/TDAP  04/21/2017   PAP SMEAR-Modifier  05/20/2019   INFLUENZA VACCINE  10/29/2020   Hepatitis C Screening  Completed   HIV Screening  Completed   HPV VACCINES  Aged Out   Cancer Screenings: Lung: Low Dose CT Chest recommended if Age 27-80 years, 30 pack-year currently smoking OR have quit w/in 15years. Patient does qualify. Breast:  Up to date on Mammogram? No   Up to date of Bone Density/Dexa? NA Colorectal: Never- counseling given   Additional Screenings: Hepatitis C Screening: Completed  HIV Screening: Completed  Plan:  PCP apt scheduled for 7/18 @10 :10am. Consider Covid Vaccine- discussed in detail. [facility] to schedule your mamm[facility].  Ask for an advance directive packet at next visit- discussed in detail.   I have personally reviewed and noted the following in the patient's chart:   Medical and social history Use of alcohol, tobacco or illicit drugs  Current medications and supplements Functional ability and status Nutritional status Physical activity Advanced directives List of other  physicians Hospitalizations, surgeries, and ER visits in previous 12 months Vitals Screenings to include cognitive, depression, and falls Referrals and appointments  In addition, I have reviewed and discussed with patient certain preventive protocols, quality metrics, and best practice recommendations. A written personalized care plan for preventive services as well as general preventive health recommendations were provided to patient.  This visit was conducted virtually in the setting of the COVID19 pandemic.    Steva Coldermily P Ej Pinson, CMA  09/25/2020   I have reviewed this visit and agree with the documentation.  Terisa Starrarina Brown, MD  Family Medicine Teaching Service

## 2020-10-15 ENCOUNTER — Other Ambulatory Visit: Payer: Self-pay

## 2020-10-15 ENCOUNTER — Ambulatory Visit (INDEPENDENT_AMBULATORY_CARE_PROVIDER_SITE_OTHER): Payer: Medicare HMO | Admitting: Family Medicine

## 2020-10-15 DIAGNOSIS — E669 Obesity, unspecified: Secondary | ICD-10-CM | POA: Diagnosis not present

## 2020-10-15 DIAGNOSIS — I10 Essential (primary) hypertension: Secondary | ICD-10-CM | POA: Diagnosis not present

## 2020-10-15 DIAGNOSIS — E785 Hyperlipidemia, unspecified: Secondary | ICD-10-CM

## 2020-10-15 DIAGNOSIS — M25562 Pain in left knee: Secondary | ICD-10-CM | POA: Diagnosis not present

## 2020-10-15 DIAGNOSIS — F17200 Nicotine dependence, unspecified, uncomplicated: Secondary | ICD-10-CM

## 2020-10-15 DIAGNOSIS — Z0000 Encounter for general adult medical examination without abnormal findings: Secondary | ICD-10-CM | POA: Diagnosis not present

## 2020-10-15 NOTE — Patient Instructions (Addendum)
It was great seeing you today!  Today at your annual preventive visit we talked about the following measures: I recommend 150 minutes of exercise per week-try 30 minutes 5 days per week We discussed reducing sugary beverages (like soda and juice) and increasing leafy greens and whole fruits.  We discussed avoiding tobacco and alcohol.  Your blood pressure is good today at 138/78. Continue with your current regimen.   We are also checking your blood work: A1c to check her sugar, checking your kidneys, and your cholesterol.  If anything is abnormal I will give you a call.  I also put in to get your knee x-ray which you can get done at any time at [facility]. I put in for your lung cancer screening, but please wait for a call to make sure your insurance approves it.I provided you with a colonoscopy and mammogram forms to schedule times to get both of those done.  You can go to the pharmacy to get your Tdap, and shingles vaccines.  Please check-out at the front desk before leaving the clinic. I'd like to see you back in the next 2 weeks to complete your Pap smear and check on your knee and shoulder, but if you need to be seen earlier than that for any new issues we're happy to fit you in, just give [place] a call!   If you haven't already, sign up for My Chart to have easy access to your labs results, and communication with your primary care physician.  Feel free to call with any questions or concerns at any time, at [phone].   Take care,  Dr. Cora Collum [facility]

## 2020-10-15 NOTE — Progress Notes (Signed)
    SUBJECTIVE:   Chief compliant/HPI: annual examination  Cynthia Hess is a 65 y.o. who presents today for an annual exam.   States she fell 2 days ago at her house, falling on her left side. States knee and shoulder are sore. Denies hitting head or loss of consciousness.   Endorses night sweats for the past year and is also hot during the day  Started menopause in late 7s. Denies CP or palpitations. Denies wanting to start hormone therapy.   HTN: BP 138/78  Trying to cut down on salts Has been watching diet- eating less sweets.  Still eats a lot of sugar and 4 sodas a day. She asks if sugar can affect diabetes- states she knows carbs does. States she was doing a lot of walking but its been hot so she hasnt been walking as much.   States she smokes about a pack a day for 20 years. Has tried to stop several times- states she tried nicotine patch and pills which she said she did not take long. Interested in quitting    OBJECTIVE:   There were no vitals taken for this visit.   General: alert, well appearing, NAD CV: RRR no murmurs Resp: CTAB normal WOB GI: soft, non distended MSK: L shoulder with no erythema or lesions. Normal ROM. Mild tenderness around acromion  Right knee without erythema edema.  Limited range of motion in all fields.  Strength 4/5.  Tender to palpation diffusely   ASSESSMENT/PLAN:   No problem-specific Assessment & Plan notes found for this encounter.    Annual Examination  See AVS for age appropriate recommendations  PHQ score reviewed and discussed.  BP reviewed (138/78)and at goal.  Continue lisinopril 40 mg daily  Considered the following items based upon USPSTF recommendations: Diabetes screening: ordered Screening for elevated cholesterol: ordered HIV testing:  not indicated  Hepatitis C:  already performed  Hepatitis B:  not indicated  Reviewed risk factors for latent tuberculosis and not indicated  Discussed family history, BRCA testing  not indicated.  Cervical cancer screening:  due for pap at follow up  Breast cancer screening: discussed potential benefits, risks including overdiagnosis and biopsy, elected proceed with mammogram Colorectal cancer screening:  provided colonoscopy form to schedule appointment Lung cancer screening: done. See documentation below regarding indications/risks/benefits.   Left knee and left shoulder Patient fell on knee and shoulder 2 days prior.  No erythema or visible lesions.  Left knee with edema and pain on palpation diffusely - Ordered knee and shoulder x-rays  Tobacco use 20-pack-year history.  Interested in quitting.  Has tried in the past and has been unsuccessful.  Will forward chart to Dr. Valentina Lucks for additional tobacco cessation counseling -CT chest lung cancer screen  Hyperlipidemia History of elevated triglycerides.  Currently on Lipitor 40 mg - Check lipid panel  Prediabetes  Current A1c 5.9 up from 5.611 months ago.  Discussed l dietary changes such as reducing sweets in her diet including sodas.  Follow up in 2 weeks for pap smear or sooner if indicated.    [place]

## 2020-10-16 ENCOUNTER — Telehealth: Payer: Self-pay

## 2020-10-16 ENCOUNTER — Ambulatory Visit
Admission: RE | Admit: 2020-10-16 | Discharge: 2020-10-16 | Disposition: A | Discharge status: Home / Self Care | Payer: Medicare HMO | Source: Ambulatory Visit | Attending: Family Medicine | Admitting: Family Medicine

## 2020-10-16 DIAGNOSIS — M25512 Pain in left shoulder: Secondary | ICD-10-CM | POA: Diagnosis not present

## 2020-10-16 DIAGNOSIS — M25562 Pain in left knee: Secondary | ICD-10-CM

## 2020-10-16 LAB — BASIC METABOLIC PANEL
BUN/Creatinine Ratio: 23 (ref 12–28)
BUN: 13 mg/dL (ref 8–27)
CO2: 22 mmol/L (ref 20–29)
Calcium: 9.3 mg/dL (ref 8.7–10.3)
Chloride: 106 mmol/L (ref 96–106)
Creatinine, Ser: 0.57 mg/dL (ref 0.57–1.00)
Glucose: 104 mg/dL — ABNORMAL HIGH (ref 65–99)
Potassium: 3.6 mmol/L (ref 3.5–5.2)
Sodium: 141 mmol/L (ref 134–144)
eGFR: 101 mL/min/{1.73_m2} (ref >59)

## 2020-10-16 LAB — HEMOGLOBIN A1C
Est. average glucose Bld gHb Est-mCnc: 123 mg/dL
Hgb A1c MFr Bld: 5.9 % — ABNORMAL HIGH (ref 4.8–5.6)

## 2020-10-16 LAB — LIPID PANEL
Chol/HDL Ratio: 3 ratio (ref 0.0–4.4)
Cholesterol, Total: 139 mg/dL (ref 100–199)
HDL: 46 mg/dL (ref >39)
LDL Chol Calc (NIH): 61 mg/dL (ref 0–99)
Triglycerides: 197 mg/dL — ABNORMAL HIGH (ref 0–149)
VLDL Cholesterol Cal: 32 mg/dL (ref 5–40)

## 2020-10-16 NOTE — Telephone Encounter (Signed)
Patient calls nurse line requesting a shoulder xray. Patient reports she has not gone for knee, as she would like to get them both at the same time. Please advise and place order if appropriate.

## 2020-10-16 NOTE — Telephone Encounter (Signed)
Patient informed and she plans to go today.

## 2020-10-18 ENCOUNTER — Telehealth: Payer: Self-pay

## 2020-10-18 NOTE — Telephone Encounter (Signed)
Called patient and LVM with appointment:  CT Chest Lung Cancer Screen  10/24/2020 1330 with showtime of [phone]  @[phone]  [address], [address] [place], [place] [phone]  [phone], CMA

## 2020-10-24 ENCOUNTER — Ambulatory Visit (HOSPITAL_BASED_OUTPATIENT_CLINIC_OR_DEPARTMENT_OTHER): Admission: RE | Admit: 2020-10-24 | Payer: Medicare HMO | Source: Ambulatory Visit

## 2020-10-24 ENCOUNTER — Telehealth: Payer: Self-pay

## 2020-10-24 DIAGNOSIS — Z1211 Encounter for screening for malignant neoplasm of colon: Secondary | ICD-10-CM

## 2020-10-24 NOTE — Telephone Encounter (Signed)
Patient calls nurse line requesting a referral sent to [facility] on [address] Patient reports an upcoming apt with this practice for a colonoscopy. Please advise.

## 2020-10-26 ENCOUNTER — Telehealth: Payer: Self-pay

## 2020-10-26 NOTE — Telephone Encounter (Signed)
Patient calls nurse line and would like to discuss imaging results with provider. Patient had shoulder and knee XR on 7/19.  Please advise.   Veronda Prude, RN

## 2020-11-07 ENCOUNTER — Ambulatory Visit: Payer: Medicare HMO | Admitting: Family Medicine

## 2020-11-07 ENCOUNTER — Ambulatory Visit: Payer: Medicare HMO

## 2020-11-09 ENCOUNTER — Ambulatory Visit (HOSPITAL_BASED_OUTPATIENT_CLINIC_OR_DEPARTMENT_OTHER): Payer: Medicare HMO | Attending: Family Medicine

## 2020-11-16 ENCOUNTER — Encounter: Payer: Self-pay | Admitting: Family Medicine

## 2020-11-16 ENCOUNTER — Ambulatory Visit (INDEPENDENT_AMBULATORY_CARE_PROVIDER_SITE_OTHER): Payer: Medicare HMO | Admitting: Family Medicine

## 2020-11-16 ENCOUNTER — Other Ambulatory Visit: Payer: Self-pay

## 2020-11-16 VITALS — BP 142/82 | HR 84 | Ht 62.0 in | Wt 181.4 lb

## 2020-11-16 DIAGNOSIS — M25562 Pain in left knee: Secondary | ICD-10-CM

## 2020-11-16 MED ORDER — TETANUS-DIPHTH-ACELL PERTUSSIS 5-2.5-18.5 LF-MCG/0.5 IM SUSP: 0.5000 mL | Freq: Once | INTRAMUSCULAR | 0 refills | Status: AC

## 2020-11-16 MED ORDER — DICLOFENAC SODIUM 1 % EX GEL: 4.0000 g | Freq: Four times a day (QID) | CUTANEOUS | 0 refills | Status: DC

## 2020-11-16 NOTE — Progress Notes (Signed)
    SUBJECTIVE:   CHIEF COMPLAINT / HPI:   Cynthia Hess is a 65 yo who presents for her pap smear. States she has not had an abnormal pap smear. She endorses having a hysterectomy but is pretty sure she still has her cervix. Denies any vaginal complaints. Denies concern for STIs or wanting to get tested. Due for mammogram but states she had to reschedule appointment.   Previous visit 7/18 had fall hurting shoulder and knee. Shoulder XR was normal, knee with mild arthritic changes. Patient states knee still bothers her and is still swollen.     OBJECTIVE:   BP (!) 142/82   Pulse 84   Ht 5\' 2"  (1.575 m)   Wt 181 lb 6.4 oz (82.3 kg)   SpO2 98%   BMI 33.18 kg/m    Physical exam  General: well appearing, NAD Cardiovascular: RRR, no murmurs Lungs: CTAB. Normal WOB Abdomen: soft, non-distended, non-tender Skin: warm, dry.  MSK: L knee edematous without erythema. Mildly tender to palpation diffusely. Normal strength and ROM  Pelvic exam: normal external genitalia, vulva, vagina. Cervix unable to be visualized   ASSESSMENT/PLAN:   No problem-specific Assessment & Plan notes found for this encounter.   Pap smear Attempted to perform pap but no cervix visualized. History of hysterectomy, though states she still has cervix. Painful exam even with smallest speculum, so after unable to visualize x 2 attempts, ceased exam   L knee pain From fall on 7/16. XR showing no acute abnormalities but with mild arthritic changes. Prescribed Voltaren gel to hopefully help with some of the pain. Also referred to PT   Health maintenance Patient to schedule mammogram and colonoscopy. Patient to get Tdap at pharmacy. Discussed shingles and pneumonia vaccines which she will think about it   8/16, DO [facility]

## 2020-11-16 NOTE — Patient Instructions (Signed)
It was great seeing you today!  Today we did a pelvic exam and everything looks good.   For your knee swelling I recommend alternating between ice and heat.  I have prescribed Voltaren gel to apply up to 4 times a day to help with the pain.  Can continue alternating between Advil/ibuprofen and Tylenol.  I have also placed a referral to physical therapy to help increase mobilization.  I have sent Tdap to your pharmacy, and provided more information on shingles and pneumonia vaccines as we talked about.  Please remember to get your mammogram and colonoscopy scheduled.  . Feel free to call with any questions or concerns at any time, at [phone].   Take care,  Dr. Cora Collum [facility]   Pneumococcal Conjugate Vaccine (Prevnar 20) Suspension for Injection What is this medication? PNEUMOCOCCAL VACCINE (NEU mo KOK al vak SEEN) is a vaccine. It prevents pneumococcus bacterial infections. These bacteria can cause serious infections like pneumonia, meningitis, and blood infections. This vaccine will not treat an infection and will not cause infection. This vaccine is recommended foradults 18 years and older. This medicine may be used for other purposes; ask your health care provider orpharmacist if you have questions. COMMON BRAND NAME(S): Prevnar 20 What should I tell my care team before I take this medication? They need to know if you have any of these conditions: bleeding disorder fever immune system problems an unusual or allergic reaction to pneumococcal vaccine, diphtheria toxoid, other vaccines, other medicines, foods, dyes, or preservatives pregnant or trying to get pregnant breast-feeding How should I use this medication? This vaccine is injected into a muscle. It is given by a health care provider. A copy of Vaccine Information Statements will be given before each vaccination. Be sure to read this information carefully each time. This sheet may  changeoften. Talk to your health care provider about the use of this medicine in children.Special care may be needed. Overdosage: If you think you have taken too much of this medicine contact apoison control center or emergency room at once. NOTE: This medicine is only for you. Do not share this medicine with others. What if I miss a dose? This does not apply. This medicine is not for regular use. What may interact with this medication? medicines for cancer chemotherapy medicines that suppress your immune function steroid medicines like prednisone or cortisone This list may not describe all possible interactions. Give your health care provider a list of all the medicines, herbs, non-prescription drugs, or dietary supplements you use. Also tell them if you smoke, drink alcohol, or use illegaldrugs. Some items may interact with your medicine. What should I watch for while using this medication? Mild fever and pain should go away in 3 days or less. Report any unusualsymptoms to your health care provider. What side effects may I notice from receiving this medication? Side effects that you should report to your doctor or health care professionalas soon as possible: allergic reactions (skin rash, itching or hives; swelling of the face, lips, or tongue) confusion fast, irregular heartbeat fever over 102 degrees F muscle weakness seizures trouble breathing unusual bruising or bleeding Side effects that usually do not require medical attention (report to yourdoctor or health care professional if they continue or are bothersome): fever of 102 degrees F or less headache joint pain muscle cramps, pain pain, tender at site where injected This list may not describe all possible side effects. Call your doctor for medical advice about  side effects. You may report side effects to FDA at1-800-FDA-1088. Where should I keep my medication? This vaccine is only given by a health care provider. It will not  be stored athome. NOTE: This sheet is a summary. It may not cover all possible information. If you have questions about this medicine, talk to your doctor, pharmacist, orhealth care provider.  2022 Elsevier/Gold Standard (2019-11-24 16:14:35)  Recombinant Zoster (Shingles) Vaccine: What You Need to Know 1. Why get vaccinated? Recombinant zoster (shingles) vaccine can prevent shingles. Shingles (also called herpes zoster, or just zoster) is a painful skin rash, usually with blisters. In addition to the rash, shingles can cause fever, headache, chills, or upset stomach. More rarely, shingles can lead to pneumonia, hearingproblems, blindness, brain inflammation (encephalitis), or death. The most common complication of shingles is long-term nerve pain called postherpetic neuralgia (PHN). PHN occurs in the areas where the shingles rash was, even after the rash clears up. It can last for months or years after therash goes away. The pain from PHN can be severe and debilitating. About 10 to 18% of people who get shingles will experience PHN. The risk of PHN increases with age. An older adult with shingles is more likely to develop PHN and have longer lasting and more severe pain than a younger person withshingles. Shingles is caused by the varicella zoster virus, the same virus that causes chickenpox. After you have chickenpox, the virus stays in your body and can cause shingles later in life. Shingles cannot be passed from one person to another, but the virus that causes shingles can spread and cause chickenpox insomeone who had never had chickenpox or received chickenpox vaccine. 2. Recombinant shingles vaccine Recombinant shingles vaccine provides strong protection against shingles. Bypreventing shingles, recombinant shingles vaccine also protects against PHN. Recombinant shingles vaccine is the preferred vaccine for the prevention of shingles. However, a different vaccine, live shingles vaccine, may be  used in somecircumstances. The recombinant shingles vaccine is recommended for adults 50 years and older without serious immune problems. It is given as a two-dose series. This vaccine is also recommended for people who have already gotten another type of shingles vaccine, the live shingles vaccine. There is no live virus inthis vaccine. Shingles vaccine may be given at the same time as other vaccines. 3. Talk with your health care provider Tell your vaccine provider if the person getting the vaccine: Has had an allergic reaction after a previous dose of recombinant shingles vaccine, or has any severe, life-threatening allergies. Is pregnant or breastfeeding. Is currently experiencing an episode of shingles. In some cases, your health care provider may decide to postpone shinglesvaccination to a future visit. People with minor illnesses, such as a cold, may be vaccinated. People who are moderately or severely ill should usually wait until they recover beforegetting recombinant shingles vaccine. Your health care provider can give you more information. 4. Risks of a vaccine reaction A sore arm with mild or moderate pain is very common after recombinant shingles vaccine, affecting about 80% of vaccinated people. Redness and swelling can also happen at the site of the injection. Tiredness, muscle pain, headache, shivering, fever, stomach pain, and nausea happen after vaccination in more than half of people who receive recombinant shingles vaccine. In clinical trials, about 1 out of 6 people who got recombinant zoster vaccine experienced side effects that prevented them from doing regular activities.Symptoms usually went away on their own in 2 to 3 days. You should still get the second dose of  recombinant zoster vaccine even if youhad one of these reactions after the first dose. People sometimes faint after medical procedures, including vaccination. Tellyour provider if you feel dizzy or have vision  changes or ringing in the ears. As with any medicine, there is a very remote chance of a vaccine causing asevere allergic reaction, other serious injury, or death. 5. What if there is a serious problem? An allergic reaction could occur after the vaccinated person leaves the clinic. If you see signs of a severe allergic reaction (hives, swelling of the face and throat, difficulty breathing, a fast heartbeat, dizziness, or weakness), call 9-1-1 and get the person to the nearest hospital. For other signs that concern you, call your health care provider. Adverse reactions should be reported to the Vaccine Adverse Event Reporting System (VAERS). Your health care provider will usually file this report, or you can do it yourself. Visit the VAERS website at www.vaers.LAgents.no or call 709-319-2502. VAERS is only for reporting reactions, and VAERS staff do not give medical advice. 6. How can I learn more? Ask your health care provider. Call your local or state health department. Contact the Centers for Disease Control and Prevention (CDC): Call 8722649153 (1-800-CDC-INFO) or Visit CDC's website at PicCapture.uy Vaccine Information Statement Recombinant Zoster Vaccine (01/27/2018) This information is not intended to replace advice given to you by your health care provider. Make sure you discuss any questions you have with your healthcare provider. Document Revised: 11/18/2019 Document Reviewed: 11/18/2019 Elsevier Patient Education  2022 [employer].

## 2021-01-22 ENCOUNTER — Other Ambulatory Visit: Payer: Self-pay | Admitting: Family Medicine

## 2021-01-22 DIAGNOSIS — J301 Allergic rhinitis due to pollen: Secondary | ICD-10-CM

## 2021-05-29 ENCOUNTER — Other Ambulatory Visit: Payer: Self-pay | Admitting: Family Medicine

## 2021-05-29 DIAGNOSIS — I10 Essential (primary) hypertension: Secondary | ICD-10-CM

## 2021-07-18 ENCOUNTER — Other Ambulatory Visit: Payer: Self-pay | Admitting: Family Medicine

## 2021-07-18 DIAGNOSIS — J301 Allergic rhinitis due to pollen: Secondary | ICD-10-CM

## 2022-01-27 ENCOUNTER — Other Ambulatory Visit: Payer: Self-pay | Admitting: Family Medicine

## 2022-01-27 DIAGNOSIS — E2839 Other primary ovarian failure: Secondary | ICD-10-CM

## 2022-01-28 ENCOUNTER — Other Ambulatory Visit: Payer: Self-pay | Admitting: Family Medicine

## 2022-01-28 DIAGNOSIS — Z1231 Encounter for screening mammogram for malignant neoplasm of breast: Secondary | ICD-10-CM

## 2022-02-05 ENCOUNTER — Other Ambulatory Visit: Payer: Self-pay | Admitting: Family Medicine

## 2022-02-05 DIAGNOSIS — J301 Allergic rhinitis due to pollen: Secondary | ICD-10-CM

## 2022-02-27 ENCOUNTER — Encounter: Payer: Self-pay | Admitting: Family Medicine

## 2022-02-27 ENCOUNTER — Ambulatory Visit: Payer: Medicare HMO

## 2022-03-17 ENCOUNTER — Other Ambulatory Visit: Payer: Self-pay | Admitting: Family Medicine

## 2022-03-17 DIAGNOSIS — I10 Essential (primary) hypertension: Secondary | ICD-10-CM

## 2022-04-23 ENCOUNTER — Ambulatory Visit: Payer: Medicare HMO

## 2022-04-23 ENCOUNTER — Other Ambulatory Visit: Payer: Self-pay | Admitting: Registered Nurse

## 2022-04-23 ENCOUNTER — Ambulatory Visit
Admission: RE | Admit: 2022-04-23 | Discharge: 2022-04-23 | Disposition: A | Discharge status: Home / Self Care | Payer: Medicare HMO | Source: Ambulatory Visit | Attending: Registered Nurse | Admitting: Registered Nurse

## 2022-04-23 DIAGNOSIS — M25552 Pain in left hip: Secondary | ICD-10-CM

## 2022-04-29 ENCOUNTER — Ambulatory Visit: Payer: Medicare HMO

## 2022-05-01 ENCOUNTER — Ambulatory Visit: Payer: Medicare HMO

## 2022-06-03 ENCOUNTER — Ambulatory Visit: Payer: Self-pay | Admitting: Physician Assistant

## 2022-06-03 DIAGNOSIS — G8929 Other chronic pain: Secondary | ICD-10-CM

## 2022-06-03 NOTE — H&P (Signed)
TOTAL HIP ADMISSION H&P  Patient is admitted for left total hip arthroplasty.  Subjective:  Chief Complaint: left hip pain  HPI: Cynthia Hess, 67 y.o. female, has a history of pain and functional disability in the left hip(s) due to arthritis and patient has failed non-surgical conservative treatments for greater than 12 weeks to include NSAID's and/or analgesics, corticosteriod injections, use of assistive devices, and activity modification.  Onset of symptoms was gradual starting 5 years ago with gradually worsening course since that time.The patient noted no past surgery on the left hip(s).  Patient currently rates pain in the left hip at 9 out of 10 with activity. Patient has night pain, worsening of pain with activity and weight bearing, trendelenberg gait, pain that interfers with activities of daily living, and pain with passive range of motion. Patient has evidence of periarticular osteophytes and joint space narrowing by imaging studies. This condition presents safety issues increasing the risk of falls.  There is no current active infection.  Patient Active Problem List   Diagnosis Date Noted  . Allergic dermatitis 04/03/2020  . Dyspepsia 11/10/2019  . Chronic back pain 11/10/2019  . Hyperlipidemia 11/10/2019  . Palpitations 11/18/2018  . Mood changes 11/18/2018  . Neuropathy, cervical (radicular) 02/03/2018  . Allergic rhinitis 10/03/2016  . Vaginal dryness 05/19/2016  . S/P lumbar spinal fusion 09/29/2014  . Primary hypertension 09/01/2013  . S/P hernia repair 06/24/2013  . Obesity (BMI 30-39.9) 06/03/2013  . Tobacco use disorder 06/03/2013  . Lumbosacral spondylosis without myelopathy 10/03/2011  . TRANSAMINASES, SERUM, ELEVATED 02/19/2010  . Osteoarthritis of spine 02/09/2008  . SUBACROMIAL BURSITIS, LEFT 10/30/2007  . DEGENERATIVE DISC DISEASE, CERVICAL SPINE 10/18/2007  . Recurrent incisional hernia with incarceration s/p lap repair w mesh 06/24/2013 05/21/2007  .  MICROSCOPIC HEMATURIA 04/22/2007   Past Medical History:  Diagnosis Date  . Allergic rhinitis 10/03/2016  . Arthritis    in low back  . Asthma 2009  . CLOSED FRACTURE OF METATARSAL BONE 12/29/2008   Annotation: minimally displaced fracture through the fifth metatarsal Qualifier: Diagnosis of  By: Hassell Done FNP, Tori Milks    . COSTOCHONDRITIS 09/04/2008   Qualifier: Diagnosis of  By: Hassell Done FNP, Tori Milks    . De Quervain's tenosynovitis, left 10/03/2016  . [place] DISEASE, CERVICAL SPINE 10/18/2007   Qualifier: Diagnosis of  By: Hassell Done FNP, Tori Milks    . DENTAL CARIES 02/04/2008   Qualifier: Diagnosis of  By: Hassell Done FNP, Tori Milks    . Depression    meds in the past/ denies 06/16/13  . HERNIATED LUMBOSACRAL [place] 05/02/2008   Qualifier: Diagnosis of  By: [place], [place]    . Hyperlipidemia 2013  . Hypertension 2009  . Left hip pain 11/02/2013  . Lumbosacral spondylosis without myelopathy 10/03/2011  . Microscopic hematuria 04/22/2007   Qualifier: Diagnosis of  By: Isla Pence    . Mood changes 11/18/2018  . NEPHROLITHIASIS 05/21/2007   Annotation: bilateral Qualifier: Diagnosis of  By: Hassell Done FNP, Tori Milks    . Neuropathy, cervical (radicular) 02/03/2018  . Obesity (BMI 30-39.9) 06/03/2013  . Osteoarthritis of spine 02/09/2008   Qualifier: Diagnosis of  By: Hassell Done FNP, Tori Milks    . PAIN IN SOFT TISSUES OF LIMB 04/15/2007   Qualifier: Diagnosis of  By: Hassell Done FNP, Tori Milks    . Palpitations 11/18/2018  . Primary hypertension 09/01/2013  . Recurrent incisional hernia with incarceration s/p lap repair w mesh 06/24/2013 05/21/2007   Qualifier: Diagnosis of  By: Hassell Done FNP, Tori Milks    . S/P  hernia repair 06/24/2013  . S/P lumbar spinal fusion 09/29/2014  . Sciatica 10/03/2011  . SUBACROMIAL BURSITIS, LEFT 10/30/2007   Annotation: per MRI Qualifier: Diagnosis of  By: Hassell Done FNP, Tori Milks    . Tobacco use disorder 06/03/2013  . TRANSAMINASES, SERUM, ELEVATED 02/19/2010   Qualifier: Diagnosis of  By:  Hassell Done FNP, Tori Milks    . TRICHOMONIASIS 04/22/2007   Qualifier: History of  By: Hassell Done FNP, Tori Milks    . Vaginal dryness 05/19/2016  . WRIST PAIN, BILATERAL 01/09/2010   Qualifier: Diagnosis of  By: Hassell Done FNP, Tori Milks      Past Surgical History:  Procedure Laterality Date  . ABDOMINAL HYSTERECTOMY  1989   still gets pap smears  . BLADDER REPAIR  1989   ?bladder injury  . DILATION AND CURETTAGE OF UTERUS    . HERNIA REPAIR  1990s   reports total of 3 surgeries, most recently 2007  . INSERTION OF MESH N/A 06/24/2013   Procedure: INSERTION OF MESH;  Surgeon: Adin Hector, MD;  Location: [facility];  Service: General;  Laterality: N/A;  . MAXIMUM ACCESS (MAS)POSTERIOR LUMBAR INTERBODY FUSION (PLIF) 2 LEVEL N/A 09/29/2014   Procedure: FOR MAXIMUM ACCESS (MAS) POSTERIOR LUMBAR INTERBODY FUSION  LUMBAR FOUR-FIVE,LUMBAR FIVE-SACRAL ONE;  Surgeon: Eustace Moore, MD;  Location: [place] [facility];  Service: Neurosurgery;  Laterality: N/A;  . MULTIPLE TOOTH EXTRACTIONS    . [place]   for "blockage"  . TONSILLECTOMY  1967  . TONSILLECTOMY    . TUBAL LIGATION  1979  . UMBILICAL HERNIA REPAIR  2005   open w suture  . VENTRAL HERNIA REPAIR  2007   open w mesh Dr Rise Patience  . VENTRAL HERNIA REPAIR N/A 06/24/2013   Procedure: LAPAROSCOPIC VENTRAL WALL HERNIA REPAIR;  Surgeon: Adin Hector, MD;  Location: [facility];  Service: General;  Laterality: N/A;    Current Outpatient Medications  Medication Sig Dispense Refill Last Dose  . atorvastatin (LIPITOR) 40 MG tablet TAKE 1 TABLET(40 MG) BY MOUTH DAILY 90 tablet 3   . atorvastatin (LIPITOR) 40 MG tablet TAKE 1 TABLET(40 MG) BY MOUTH DAILY 90 tablet 3   . diclofenac Sodium (VOLTAREN) 1 % GEL Apply 4 g topically 4 (four) times daily. 4 g 0   . fluticasone (FLONASE) 50 MCG/ACT nasal spray SHAKE LIQUID AND USE 2 SPRAYS IN EACH NOSTRIL DAILY 16 g 2   . lisinopril (ZESTRIL) 40 MG tablet TAKE 1 TABLET(40 MG) BY MOUTH DAILY 90 tablet 2   .  OVER THE COUNTER MEDICATION Take 3 tablets by mouth daily as needed (PAIN). STORE BRAND PAIN RELIEVER     . triamcinolone ointment (KENALOG) 0.1 % Apply 1 application topically 2 (two) times daily. (Patient not taking: Reported on 09/25/2020) 15 g 0    No current facility-administered medications for this visit.   Allergies  Allergen Reactions  . Latex Itching and Swelling  . Other Hives    Black Hair dye, hair loss  . Banana Swelling    Social History   Tobacco Use  . Smoking status: Every Day    Packs/day: 1.00    Years: 25.00    Total pack years: 25.00    Types: Cigarettes  . Smokeless tobacco: Never  Substance Use Topics  . Alcohol use: Yes    Alcohol/week: 3.0 standard drinks of alcohol    Types: 3 Cans of beer per week    Family History  Problem Relation Age of Onset  . Early  death Mother        when pt was 35; poisoned  . Early death Father        when pt was 32, unknown   . Asthma Brother   . Diabetes Brother   . Heart murmur Brother   . Hypertension Brother   . Alzheimer's disease Maternal Grandmother   . Hypertension Brother      Review of Systems  Musculoskeletal:  Positive for arthralgias.  Hematological:  Bruises/bleeds easily.  All other systems reviewed and are negative.  Objective:  Physical Exam Constitutional:      General: She is not in acute distress.    Appearance: Normal appearance.  HENT:     Head: Normocephalic and atraumatic.  Eyes:     Extraocular Movements: Extraocular movements intact.     Pupils: Pupils are equal, round, and reactive to light.  Cardiovascular:     Rate and Rhythm: Normal rate and regular rhythm.     Pulses: Normal pulses.     Heart sounds: Normal heart sounds.  Pulmonary:     Effort: Pulmonary effort is normal. No respiratory distress.     Breath sounds: Normal breath sounds. No wheezing.  Abdominal:     General: Abdomen is flat. Bowel sounds are normal. There is no distension.     Palpations: Abdomen is  soft.     Tenderness: There is no abdominal tenderness.  Musculoskeletal:     Cervical back: Normal range of motion and neck supple.     Left hip: Tenderness and bony tenderness present. Decreased range of motion. Decreased strength.  Lymphadenopathy:     Cervical: No cervical adenopathy.  Skin:    General: Skin is warm and dry.     Findings: No erythema or rash.  Neurological:     General: No focal deficit present.     Mental Status: She is alert and oriented to person, place, and time.  Psychiatric:        Mood and Affect: Mood normal.        Behavior: Behavior normal.   Vital signs in last 24 hours: '@VSRANGES'$ @  Labs:   Estimated body mass index is 33.18 kg/m as calculated from the following:   Height as of 11/16/20: '5\' 2"'$  (1.575 m).   Weight as of 11/16/20: 82.3 kg.   Imaging Review Plain radiographs demonstrate moderate degenerative joint disease of the left hip(s). The bone quality appears to be good for age and reported activity level.      Assessment/Plan:  End stage arthritis, left hip(s)  The patient history, physical examination, clinical judgement of the provider and imaging studies are consistent with end stage degenerative joint disease of the left hip(s) and total hip arthroplasty is deemed medically necessary. The treatment options including medical management, injection therapy, arthroscopy and arthroplasty were discussed at length. The risks and benefits of total hip arthroplasty were presented and reviewed. The risks due to aseptic loosening, infection, stiffness, dislocation/subluxation,  thromboembolic complications and other imponderables were discussed.  The patient acknowledged the explanation, agreed to proceed with the plan and consent was signed. Patient is being admitted for inpatient treatment for surgery, pain control, PT, OT, prophylactic antibiotics, VTE prophylaxis, progressive ambulation and ADL's and discharge planning.The patient is planning to  be discharged  home with outpt PT   Anticipated LOS equal to or greater than 2 midnights due to - Age 65 and older with one or more of the following:  - Obesity  - Expected need for  hospital services (PT, OT, Nursing) required for safe  discharge  - Anticipated need for postoperative skilled nursing care or inpatient rehab  - Active co-morbidities: None OR   - Unanticipated findings during/Post Surgery: None  - Patient is a high risk of re-admission due to: None

## 2022-06-03 NOTE — H&P (View-Only) (Signed)
TOTAL HIP ADMISSION H&P  Patient is admitted for left total hip arthroplasty.  Subjective:  Chief Complaint: left hip pain  HPI: Cynthia Hess, 67 y.o. female, has a history of pain and functional disability in the left hip(s) due to arthritis and patient has failed non-surgical conservative treatments for greater than 12 weeks to include NSAID's and/or analgesics, corticosteriod injections, use of assistive devices, and activity modification.  Onset of symptoms was gradual starting 5 years ago with gradually worsening course since that time.The patient noted no past surgery on the left hip(s).  Patient currently rates pain in the left hip at 9 out of 10 with activity. Patient has night pain, worsening of pain with activity and weight bearing, trendelenberg gait, pain that interfers with activities of daily living, and pain with passive range of motion. Patient has evidence of periarticular osteophytes and joint space narrowing by imaging studies. This condition presents safety issues increasing the risk of falls.  There is no current active infection.  Patient Active Problem List   Diagnosis Date Noted  . Allergic dermatitis 04/03/2020  . Dyspepsia 11/10/2019  . Chronic back pain 11/10/2019  . Hyperlipidemia 11/10/2019  . Palpitations 11/18/2018  . Mood changes 11/18/2018  . Neuropathy, cervical (radicular) 02/03/2018  . Allergic rhinitis 10/03/2016  . Vaginal dryness 05/19/2016  . S/P lumbar spinal fusion 09/29/2014  . Primary hypertension 09/01/2013  . S/P hernia repair 06/24/2013  . Obesity (BMI 30-39.9) 06/03/2013  . Tobacco use disorder 06/03/2013  . Lumbosacral spondylosis without myelopathy 10/03/2011  . TRANSAMINASES, SERUM, ELEVATED 02/19/2010  . Osteoarthritis of spine 02/09/2008  . SUBACROMIAL BURSITIS, LEFT 10/30/2007  . DEGENERATIVE DISC DISEASE, CERVICAL SPINE 10/18/2007  . Recurrent incisional hernia with incarceration s/p lap repair w mesh 06/24/2013 05/21/2007  .  MICROSCOPIC HEMATURIA 04/22/2007   Past Medical History:  Diagnosis Date  . Allergic rhinitis 10/03/2016  . Arthritis    in low back  . Asthma 2009  . CLOSED FRACTURE OF METATARSAL BONE 12/29/2008   Annotation: minimally displaced fracture through the fifth metatarsal Qualifier: Diagnosis of  By: Martin FNP, Nykedtra    . COSTOCHONDRITIS 09/04/2008   Qualifier: Diagnosis of  By: Martin FNP, Nykedtra    . De Quervain's tenosynovitis, left 10/03/2016  . DEGENERATIVE DISC DISEASE, CERVICAL SPINE 10/18/2007   Qualifier: Diagnosis of  By: Martin FNP, Nykedtra    . DENTAL CARIES 02/04/2008   Qualifier: Diagnosis of  By: Martin FNP, Nykedtra    . Depression    meds in the past/ denies 06/16/13  . HERNIATED LUMBOSACRAL DISC 05/02/2008   Qualifier: Diagnosis of  By: Shannon, [place]    . Hyperlipidemia 2013  . Hypertension 2009  . Left hip pain 11/02/2013  . Lumbosacral spondylosis without myelopathy 10/03/2011  . Microscopic hematuria 04/22/2007   Qualifier: Diagnosis of  By: Craddock, Brenda    . Mood changes 11/18/2018  . NEPHROLITHIASIS 05/21/2007   Annotation: bilateral Qualifier: Diagnosis of  By: Martin FNP, Nykedtra    . Neuropathy, cervical (radicular) 02/03/2018  . Obesity (BMI 30-39.9) 06/03/2013  . Osteoarthritis of spine 02/09/2008   Qualifier: Diagnosis of  By: Martin FNP, Nykedtra    . PAIN IN SOFT TISSUES OF LIMB 04/15/2007   Qualifier: Diagnosis of  By: Martin FNP, Nykedtra    . Palpitations 11/18/2018  . Primary hypertension 09/01/2013  . Recurrent incisional hernia with incarceration s/p lap repair w mesh 06/24/2013 05/21/2007   Qualifier: Diagnosis of  By: Martin FNP, Nykedtra    . S/P   hernia repair 06/24/2013  . S/P lumbar spinal fusion 09/29/2014  . Sciatica 10/03/2011  . SUBACROMIAL BURSITIS, LEFT 10/30/2007   Annotation: per MRI Qualifier: Diagnosis of  By: Martin FNP, Nykedtra    . Tobacco use disorder 06/03/2013  . TRANSAMINASES, SERUM, ELEVATED 02/19/2010   Qualifier: Diagnosis of  By:  Martin FNP, Nykedtra    . TRICHOMONIASIS 04/22/2007   Qualifier: History of  By: Martin FNP, Nykedtra    . Vaginal dryness 05/19/2016  . WRIST PAIN, BILATERAL 01/09/2010   Qualifier: Diagnosis of  By: Martin FNP, Nykedtra      Past Surgical History:  Procedure Laterality Date  . ABDOMINAL HYSTERECTOMY  1989   still gets pap smears  . BLADDER REPAIR  1989   ?bladder injury  . DILATION AND CURETTAGE OF UTERUS    . HERNIA REPAIR  1990s   reports total of 3 surgeries, most recently 2007  . INSERTION OF MESH N/A 06/24/2013   Procedure: INSERTION OF MESH;  Surgeon: Steven C. Gross, MD;  Location: [facility];  Service: General;  Laterality: N/A;  . MAXIMUM ACCESS (MAS)POSTERIOR LUMBAR INTERBODY FUSION (PLIF) 2 LEVEL N/A 09/29/2014   Procedure: FOR MAXIMUM ACCESS (MAS) POSTERIOR LUMBAR INTERBODY FUSION  LUMBAR FOUR-FIVE,LUMBAR FIVE-SACRAL ONE;  Surgeon: David S Jones, MD;  Location: [facility];  Service: Neurosurgery;  Laterality: N/A;  . MULTIPLE TOOTH EXTRACTIONS    . SMALL INTESTINE SURGERY  1989   for "blockage"  . TONSILLECTOMY  1967  . TONSILLECTOMY    . TUBAL LIGATION  1979  . UMBILICAL HERNIA REPAIR  2005   open w suture  . VENTRAL HERNIA REPAIR  2007   open w mesh Dr Weatherly  . VENTRAL HERNIA REPAIR N/A 06/24/2013   Procedure: LAPAROSCOPIC VENTRAL WALL HERNIA REPAIR;  Surgeon: Steven C. Gross, MD;  Location: [facility];  Service: General;  Laterality: N/A;    Current Outpatient Medications  Medication Sig Dispense Refill Last Dose  . atorvastatin (LIPITOR) 40 MG tablet TAKE 1 TABLET(40 MG) BY MOUTH DAILY 90 tablet 3   . atorvastatin (LIPITOR) 40 MG tablet TAKE 1 TABLET(40 MG) BY MOUTH DAILY 90 tablet 3   . diclofenac Sodium (VOLTAREN) 1 % GEL Apply 4 g topically 4 (four) times daily. 4 g 0   . fluticasone (FLONASE) 50 MCG/ACT nasal spray SHAKE LIQUID AND USE 2 SPRAYS IN EACH NOSTRIL DAILY 16 g 2   . lisinopril (ZESTRIL) 40 MG tablet TAKE 1 TABLET(40 MG) BY MOUTH DAILY 90 tablet 2   .  OVER THE COUNTER MEDICATION Take 3 tablets by mouth daily as needed (PAIN). STORE BRAND PAIN RELIEVER     . triamcinolone ointment (KENALOG) 0.1 % Apply 1 application topically 2 (two) times daily. (Patient not taking: Reported on 09/25/2020) 15 g 0    No current facility-administered medications for this visit.   Allergies  Allergen Reactions  . Latex Itching and Swelling  . Other Hives    Black Hair dye, hair loss  . Banana Swelling    Social History   Tobacco Use  . Smoking status: Every Day    Packs/day: 1.00    Years: 25.00    Total pack years: 25.00    Types: Cigarettes  . Smokeless tobacco: Never  Substance Use Topics  . Alcohol use: Yes    Alcohol/week: 3.0 standard drinks of alcohol    Types: 3 Cans of beer per week    Family History  Problem Relation Age of Onset  . Early   death Mother        when pt was 12; poisoned  . Early death Father        when pt was 13, unknown   . Asthma Brother   . Diabetes Brother   . Heart murmur Brother   . Hypertension Brother   . Alzheimer's disease Maternal Grandmother   . Hypertension Brother      Review of Systems  Musculoskeletal:  Positive for arthralgias.  Hematological:  Bruises/bleeds easily.  All other systems reviewed and are negative.  Objective:  Physical Exam Constitutional:      General: She is not in acute distress.    Appearance: Normal appearance.  HENT:     Head: Normocephalic and atraumatic.  Eyes:     Extraocular Movements: Extraocular movements intact.     Pupils: Pupils are equal, round, and reactive to light.  Cardiovascular:     Rate and Rhythm: Normal rate and regular rhythm.     Pulses: Normal pulses.     Heart sounds: Normal heart sounds.  Pulmonary:     Effort: Pulmonary effort is normal. No respiratory distress.     Breath sounds: Normal breath sounds. No wheezing.  Abdominal:     General: Abdomen is flat. Bowel sounds are normal. There is no distension.     Palpations: Abdomen is  soft.     Tenderness: There is no abdominal tenderness.  Musculoskeletal:     Cervical back: Normal range of motion and neck supple.     Left hip: Tenderness and bony tenderness present. Decreased range of motion. Decreased strength.  Lymphadenopathy:     Cervical: No cervical adenopathy.  Skin:    General: Skin is warm and dry.     Findings: No erythema or rash.  Neurological:     General: No focal deficit present.     Mental Status: She is alert and oriented to person, place, and time.  Psychiatric:        Mood and Affect: Mood normal.        Behavior: Behavior normal.   Vital signs in last 24 hours: @VSRANGES@  Labs:   Estimated body mass index is 33.18 kg/m as calculated from the following:   Height as of 11/16/20: 5' 2" (1.575 m).   Weight as of 11/16/20: 82.3 kg.   Imaging Review Plain radiographs demonstrate moderate degenerative joint disease of the left hip(s). The bone quality appears to be good for age and reported activity level.      Assessment/Plan:  End stage arthritis, left hip(s)  The patient history, physical examination, clinical judgement of the provider and imaging studies are consistent with end stage degenerative joint disease of the left hip(s) and total hip arthroplasty is deemed medically necessary. The treatment options including medical management, injection therapy, arthroscopy and arthroplasty were discussed at length. The risks and benefits of total hip arthroplasty were presented and reviewed. The risks due to aseptic loosening, infection, stiffness, dislocation/subluxation,  thromboembolic complications and other imponderables were discussed.  The patient acknowledged the explanation, agreed to proceed with the plan and consent was signed. Patient is being admitted for inpatient treatment for surgery, pain control, PT, OT, prophylactic antibiotics, VTE prophylaxis, progressive ambulation and ADL's and discharge planning.The patient is planning to  be discharged  home with outpt PT   Anticipated LOS equal to or greater than 2 midnights due to - Age 65 and older with one or more of the following:  - Obesity  - Expected need for   hospital services (PT, OT, Nursing) required for safe  discharge  - Anticipated need for postoperative skilled nursing care or inpatient rehab  - Active co-morbidities: None OR   - Unanticipated findings during/Post Surgery: None  - Patient is a high risk of re-admission due to: None 

## 2022-06-09 NOTE — Progress Notes (Addendum)
COVID Vaccine received:  '[x]'$  No '[]'$  Yes Date of any COVID positive Test in last 90 days:  None  PCP -Dr. Cleta Alberts at [facility] clearance on chart [phone]  [phone] Cardiologist - Mertie Moores, MD  Chest x-ray - 2016  2v  epic EKG -  11-18-2018  will repeat at PST Stress Test -  ECHO -  Cardiac Cath -   PCR screen: '[x]'$  Ordered & Completed                      '[]'$   No Order but Needs PROFEND                      '[]'$   N/A for this surgery  Surgery Plan:  '[]'$  Ambulatory                            '[x]'$  Outpatient in bed                            '[]'$  Admit  Anesthesia:    '[]'$  General  '[]'$  Spinal                           '[x]'$   Choice '[]'$   MAC  Pacemaker / ICD device '[x]'$  No '[]'$  Yes        Device order form faxed '[x]'$  No    '[]'$   Yes      Faxed to:  Spinal Cord Stimulator:'[x]'$  No '[]'$  Yes      (Remind patient to bring remote DOS) Other Implants:   History of Sleep Apnea? '[x]'$  No '[]'$  Yes   CPAP used?- '[x]'$  No '[]'$  Yes    Does the patient monitor blood sugar? '[x]'$  No '[]'$  Yes  '[]'$  N/A      Diet control  Patient has: '[x]'$  Pre-DM   '[]'$  DM1  '[]'$   DM2 Does patient have a [employer] or Dexacom? '[x]'$  No '[]'$  Yes   Fasting Blood Sugar Ranges-  Checks Blood Sugar  0_ times a day  Blood Thinner / Instructions:  None Aspirin Instructions: None  ERAS Protocol Ordered: '[]'$  No  '[x]'$  Yes PRE-SURGERY '[]'$  ENSURE  '[x]'$  G2  Patient is to be NPO after: 08:15 am  Activity level: Patient is able to climb a flight of stairs without difficulty; '[x]'$  No CP but would have SOB __   Patient can perform ADLs without assistance.   Anesthesia review: HTN, Smoker, Palps, asthma, Pre-DM   Patient denies shortness of breath, fever, and chest pain at PAT appointment. Patient has had sinus drainage and cough x 1 week, she says she has seasonal allergies.  Patient verbalized understanding and agreement to the Pre-Surgical Instructions that were given to them at this PAT appointment. Patient was also  educated of the need to review these PAT instructions again prior to her surgery.I reviewed the appropriate phone numbers to call if they have any and questions or concerns.

## 2022-06-09 NOTE — Patient Instructions (Signed)
SURGICAL WAITING ROOM VISITATION Patients having surgery or a procedure may have no more than 2 support people in the waiting area - these visitors may rotate in the visitor waiting room.   Due to an increase in RSV and influenza rates and associated hospitalizations, children ages 66 and under may not visit patients in [place]. If the patient needs to stay at the hospital during part of their recovery, the visitor guidelines for inpatient rooms apply.  PRE-OP VISITATION  Pre-op nurse will coordinate an appropriate time for 1 support person to accompany the patient in pre-op.  This support person may not rotate.  This visitor will be contacted when the time is appropriate for the visitor to come back in the pre-op area.  Please refer to the [facility] website for the visitor guidelines for Inpatients (after your surgery is over and you are in a regular room).  You are not required to quarantine at this time prior to your surgery. However, you must do this: Hand Hygiene often Do NOT share personal items Notify your provider if you are in close contact with someone who has COVID or you develop fever 100.4 or greater, new onset of sneezing, cough, sore throat, shortness of breath or body aches.  If you test positive for Covid or have been in contact with anyone that has tested positive in the last 10 days please notify you surgeon.    Your procedure is scheduled on:  Friday June 20, 2022  Report to [facility] Main Entrance: [place] entrance where the [employer] is available.   Report to admitting at: 08:45    AM  +++++Call this number if you have any questions or problems the morning of surgery [phone]  Do not eat food after Midnight the night prior to your surgery/procedure.  After Midnight you may have the following liquids until   08:15  AM DAY OF SURGERY  Clear Liquid Diet Water Black Coffee (sugar ok, NO MILK/CREAM OR CREAMERS)  Tea (sugar ok, NO  MILK/CREAM OR CREAMERS) regular and decaf                             Plain Jell-O  with no fruit (NO RED)                                           Fruit ices (not with fruit pulp, NO RED)                                     Popsicles (NO RED)                                                                  Juice: apple, WHITE grape, WHITE cranberry Sports drinks like Gatorade or Powerade (NO RED)                    The day of surgery:  Drink ONE (1) Pre-Surgery G2 at   08:15   AM the morning of surgery. Drink  in one sitting. Do not sip.  This drink was given to you during your hospital pre-op appointment visit. Nothing else to drink after completing the Pre-Surgery G2 : No candy, chewing gum or throat lozenges.    FOLLOW ANY ADDITIONAL PRE OP INSTRUCTIONS YOU RECEIVED FROM YOUR SURGEON'S OFFICE!!!   Oral Hygiene is also important to reduce your risk of infection.        Remember - BRUSH YOUR TEETH THE MORNING OF SURGERY WITH YOUR REGULAR TOOTHPASTE  Do NOT smoke after Midnight the night before surgery.  Take ONLY these medicines the morning of surgery with A SIP OF WATER: Amlodipine,  If  needed, you can take Tylenol for pain and Flonase nasal spray.   You may not have any metal on your body including hair pins, jewelry, and body piercing  Do not wear make-up, lotions, powders, perfumes or deodorant  Do not wear nail polish including gel and S&S, artificial / acrylic nails, or any other type of covering on natural nails including finger and toenails. If you have artificial nails, gel coating, etc., that needs to be removed by a nail salon, Please have this removed prior to surgery. Not doing so may mean that your surgery could be cancelled or delayed if the Surgeon or anesthesia staff feels like they are unable to monitor you safely.   Do not shave 48 hours prior to surgery to avoid nicks in your skin which may contribute to postoperative infections.   Contacts, Hearing Aids,  dentures or bridgework may not be worn into surgery. DENTURES WILL BE REMOVED PRIOR TO SURGERY PLEASE DO NOT APPLY "Poly grip" OR ADHESIVES!!!  You may bring a small overnight bag with you on the day of surgery, only pack items that are not valuable. [facility] IS NOT RESPONSIBLE   FOR VALUABLES THAT ARE LOST OR STOLEN.   Do not bring your home medications to the hospital. The Pharmacy will dispense medications listed on your medication list to you during your admission in the Hospital.  Please read over the following fact sheets you were given: IF YOU HAVE QUESTIONS ABOUT YOUR [place], [place] [phone].   [facility] - Preparing for Surgery Before surgery, you can play an important role.  Because skin is not sterile, your skin needs to be as free of germs as possible.  You can reduce the number of germs on your skin by washing with CHG (chlorahexidine gluconate) soap before surgery.  CHG is an antiseptic cleaner which kills germs and bonds with the skin to continue killing germs even after washing. Please DO NOT use if you have an allergy to CHG or antibacterial soaps.  If your skin becomes reddened/irritated stop using the CHG and inform your nurse when you arrive at Short Stay. Do not shave (including legs and underarms) for at least 48 hours prior to the first CHG shower.  You may shave your face/neck.  Please follow these instructions carefully:  1.  Shower with CHG Soap the night before surgery and the  morning of surgery.  2.  If you choose to wash your hair, wash your hair first as usual with your normal  shampoo.  3.  After you shampoo, rinse your hair and body thoroughly to remove the shampoo.                             4.  Use CHG as you would any other liquid soap.  You can apply chg directly to the skin and wash.  Gently with a scrungie or clean washcloth.  5.  Apply the CHG Soap to your body ONLY FROM THE NECK DOWN.   Do not use on face/ open                            Wound or open sores. Avoid contact with eyes, ears mouth and genitals (private parts).                       Wash face,  Genitals (private parts) with your normal soap.             6.  Wash thoroughly, paying special attention to the area where your  surgery  will be performed.  7.  Thoroughly rinse your body with warm water from the neck down.  8.  DO NOT shower/wash with your normal soap after using and rinsing off the CHG Soap.            9.  Pat yourself dry with a clean towel.            10.  Wear clean pajamas.            11.  Place clean sheets on your bed the night of your first shower and do not  sleep with pets.  ON THE DAY OF SURGERY : Do not apply any lotions/deodorants the morning of surgery.  Please wear clean clothes to the hospital/surgery center.    FAILURE TO FOLLOW THESE INSTRUCTIONS MAY RESULT IN THE CANCELLATION OF YOUR SURGERY  PATIENT SIGNATURE_________________________________  NURSE SIGNATURE__________________________________  ________________________________________________________________________        Cynthia Hess    An incentive spirometer is a tool that can help keep your lungs clear and active. This tool measures how well you are filling your lungs with each breath. Taking long deep breaths may help reverse or decrease the chance of developing breathing (pulmonary) problems (especially infection) following: A long period of time when you are unable to move or be active. BEFORE THE PROCEDURE  If the spirometer includes an indicator to show your best effort, your nurse or respiratory therapist will set it to a desired goal. If possible, sit up straight or lean slightly forward. Try not to slouch. Hold the incentive spirometer in an upright position. INSTRUCTIONS FOR USE  Sit on the edge of your bed if possible, or sit up as far as you can in bed or on a chair. Hold the incentive spirometer in an upright position. Breathe out  normally. Place the mouthpiece in your mouth and seal your lips tightly around it. Breathe in slowly and as deeply as possible, raising the piston or the ball toward the top of the column. Hold your breath for 3-5 seconds or for as long as possible. Allow the piston or ball to fall to the bottom of the column. Remove the mouthpiece from your mouth and breathe out normally. Rest for a few seconds and repeat Steps 1 through 7 at least 10 times every 1-2 hours when you are awake. Take your time and take a few normal breaths between deep breaths. The spirometer may include an indicator to show your best effort. Use the indicator as a goal to work toward during each repetition. After each set of 10 deep breaths, practice coughing to be sure your lungs are clear. If you have an incision (the  cut made at the time of surgery), support your incision when coughing by placing a pillow or rolled up towels firmly against it. Once you are able to get out of bed, walk around indoors and cough well. You may stop using the incentive spirometer when instructed by your caregiver.  RISKS AND COMPLICATIONS Take your time so you do not get dizzy or light-headed. If you are in pain, you may need to take or ask for pain medication before doing incentive spirometry. It is harder to take a deep breath if you are having pain. AFTER USE Rest and breathe slowly and easily. It can be helpful to keep track of a log of your progress. Your caregiver can provide you with a simple table to help with this. If you are using the spirometer at home, follow these instructions: [place] IF:  You are having difficultly using the spirometer. You have trouble using the spirometer as often as instructed. Your pain medication is not giving enough relief while using the spirometer. You develop fever of 100.5 F (38.1 C) or higher.                                                                                                     SEEK IMMEDIATE MEDICAL CARE IF:  You cough up bloody sputum that had not been present before. You develop fever of 102 F (38.9 C) or greater. You develop worsening pain at or near the incision site. MAKE SURE YOU:  Understand these instructions. Will watch your condition. Will get help right away if you are not doing well or get worse. Document Released: 07/28/2006 Document Revised: 06/09/2011 Document Reviewed: 09/28/2006 [facility] Patient Information 2014 [place], [place].      WHAT IS A BLOOD TRANSFUSION? Blood Transfusion Information  A transfusion is the replacement of blood or some of its parts. Blood is made up of multiple cells which provide different functions. Red blood cells carry oxygen and are used for blood loss replacement. White blood cells fight against infection. Platelets control bleeding. Plasma helps clot blood. Other blood products are available for specialized needs, such as hemophilia or other clotting disorders. BEFORE THE TRANSFUSION  Who gives blood for transfusions?  Healthy volunteers who are fully evaluated to make sure their blood is safe. This is blood bank blood. Transfusion therapy is the safest it has ever been in the practice of medicine. Before blood is taken from a donor, a complete history is taken to make sure that person has no history of diseases nor engages in risky social behavior (examples are intravenous drug use or sexual activity with multiple partners). The donor's travel history is screened to minimize risk of transmitting infections, such as malaria. The donated blood is tested for signs of infectious diseases, such as HIV and hepatitis. The blood is then tested to be sure it is compatible with you in order to minimize the chance of a transfusion reaction. If you or a relative donates blood, this is often done in anticipation of surgery and is not appropriate for emergency situations. It takes many days  to process the donated  blood. RISKS AND COMPLICATIONS Although transfusion therapy is very safe and saves many lives, the main dangers of transfusion include:  Getting an infectious disease. Developing a transfusion reaction. This is an allergic reaction to something in the blood you were given. Every precaution is taken to prevent this. The decision to have a blood transfusion has been considered carefully by your caregiver before blood is given. Blood is not given unless the benefits outweigh the risks. AFTER THE TRANSFUSION Right after receiving a blood transfusion, you will usually feel much better and more energetic. This is especially true if your red blood cells have gotten low (anemic). The transfusion raises the level of the red blood cells which carry oxygen, and this usually causes an energy increase. The nurse administering the transfusion will monitor you carefully for complications. HOME CARE INSTRUCTIONS  No special instructions are needed after a transfusion. You may find your energy is better. Speak with your caregiver about any limitations on activity for underlying diseases you may have. SEEK MEDICAL CARE IF:  Your condition is not improving after your transfusion. You develop redness or irritation at the intravenous (IV) site. SEEK IMMEDIATE MEDICAL CARE IF:  Any of the following symptoms occur over the next 12 hours: Shaking chills. You have a temperature by mouth above 102 F (38.9 C), not controlled by medicine. Chest, back, or muscle pain. People around you feel you are not acting correctly or are confused. Shortness of breath or difficulty breathing. Dizziness and fainting. You get a rash or develop hives. You have a decrease in urine output. Your urine turns a dark color or changes to pink, red, or brown. Any of the following symptoms occur over the next 10 days: You have a temperature by mouth above 102 F (38.9 C), not controlled by medicine. Shortness of breath. Weakness after  normal activity. The white part of the eye turns yellow (jaundice). You have a decrease in the amount of urine or are urinating less often. Your urine turns a dark color or changes to pink, red, or brown. Document Released: 03/14/2000 Document Revised: 06/09/2011 Document Reviewed: 11/01/2007 [facility] Patient Information 2014 [place], [place].  _______________________________________________________________________

## 2022-06-11 ENCOUNTER — Encounter (HOSPITAL_COMMUNITY): Payer: Self-pay

## 2022-06-11 ENCOUNTER — Other Ambulatory Visit: Payer: Self-pay

## 2022-06-11 ENCOUNTER — Encounter (HOSPITAL_COMMUNITY)
Admission: RE | Admit: 2022-06-11 | Discharge: 2022-06-11 | Disposition: A | Discharge status: Home / Self Care | Payer: Medicare HMO | Source: Ambulatory Visit | Attending: Orthopedic Surgery | Admitting: Orthopedic Surgery

## 2022-06-11 VITALS — BP 150/86 | HR 74 | Temp 98.6°F | Resp 18 | Ht 62.0 in | Wt 176.0 lb

## 2022-06-11 DIAGNOSIS — R9431 Abnormal electrocardiogram [ECG] [EKG]: Secondary | ICD-10-CM | POA: Diagnosis not present

## 2022-06-11 DIAGNOSIS — G8929 Other chronic pain: Secondary | ICD-10-CM | POA: Diagnosis not present

## 2022-06-11 DIAGNOSIS — I10 Essential (primary) hypertension: Secondary | ICD-10-CM | POA: Diagnosis not present

## 2022-06-11 DIAGNOSIS — Z01818 Encounter for other preprocedural examination: Secondary | ICD-10-CM | POA: Diagnosis present

## 2022-06-11 DIAGNOSIS — R7303 Prediabetes: Secondary | ICD-10-CM | POA: Diagnosis not present

## 2022-06-11 DIAGNOSIS — M25552 Pain in left hip: Secondary | ICD-10-CM | POA: Diagnosis not present

## 2022-06-11 HISTORY — DX: Personal history of urinary calculi: Z87.442

## 2022-06-11 LAB — CBC WITH DIFFERENTIAL/PLATELET
Abs Immature Granulocytes: 0.03 10*3/uL (ref 0.00–0.07)
Basophils Absolute: 0.1 10*3/uL (ref 0.0–0.1)
Basophils Relative: 1 %
Eosinophils Absolute: 0.2 10*3/uL (ref 0.0–0.5)
Eosinophils Relative: 2 %
HCT: 38.3 % (ref 36.0–46.0)
Hemoglobin: 12.6 g/dL (ref 12.0–15.0)
Immature Granulocytes: 0 %
Lymphocytes Relative: 35 %
Lymphs Abs: 2.7 10*3/uL (ref 0.7–4.0)
MCH: 32.3 pg (ref 26.0–34.0)
MCHC: 32.9 g/dL (ref 30.0–36.0)
MCV: 98.2 fL (ref 80.0–100.0)
Monocytes Absolute: 0.5 10*3/uL (ref 0.1–1.0)
Monocytes Relative: 7 %
Neutro Abs: 4.3 10*3/uL (ref 1.7–7.7)
Neutrophils Relative %: 55 %
Platelets: 329 10*3/uL (ref 150–400)
RBC: 3.9 MIL/uL (ref 3.87–5.11)
RDW: 13.3 % (ref 11.5–15.5)
WBC: 7.9 10*3/uL (ref 4.0–10.5)
nRBC: 0 % (ref 0.0–0.2)

## 2022-06-11 LAB — COMPREHENSIVE METABOLIC PANEL
ALT: 15 U/L (ref 0–44)
AST: 15 U/L (ref 15–41)
Albumin: 4.2 g/dL (ref 3.5–5.0)
Alkaline Phosphatase: 66 U/L (ref 38–126)
Anion gap: 9 (ref 5–15)
BUN: 10 mg/dL (ref 8–23)
CO2: 25 mmol/L (ref 22–32)
Calcium: 8.6 mg/dL — ABNORMAL LOW (ref 8.9–10.3)
Chloride: 108 mmol/L (ref 98–111)
Creatinine, Ser: 0.66 mg/dL (ref 0.44–1.00)
GFR, Estimated: >60 mL/min (ref >60)
Glucose, Bld: 99 mg/dL (ref 70–99)
Potassium: 3.2 mmol/L — ABNORMAL LOW (ref 3.5–5.1)
Sodium: 142 mmol/L (ref 135–145)
Total Bilirubin: 0.6 mg/dL (ref 0.3–1.2)
Total Protein: 7.3 g/dL (ref 6.5–8.1)

## 2022-06-11 LAB — SURGICAL PCR SCREEN
MRSA, PCR: NEGATIVE
Staphylococcus aureus: NEGATIVE

## 2022-06-11 LAB — HEMOGLOBIN A1C
Hgb A1c MFr Bld: 6 % — ABNORMAL HIGH (ref 4.8–5.6)
Mean Plasma Glucose: 126 mg/dL

## 2022-06-11 LAB — GLUCOSE, CAPILLARY: Glucose-Capillary: 108 mg/dL — ABNORMAL HIGH (ref 70–99)

## 2022-06-20 ENCOUNTER — Ambulatory Visit (HOSPITAL_COMMUNITY): Payer: Medicare HMO

## 2022-06-20 ENCOUNTER — Observation Stay (HOSPITAL_COMMUNITY): Payer: Medicare HMO

## 2022-06-20 ENCOUNTER — Observation Stay (HOSPITAL_COMMUNITY)
Admission: RE | Admit: 2022-06-20 | Discharge: 2022-06-21 | Disposition: A | Discharge status: Home Health | Payer: Medicare HMO | Attending: Orthopedic Surgery | Admitting: Orthopedic Surgery

## 2022-06-20 ENCOUNTER — Encounter (HOSPITAL_COMMUNITY)
Admission: RE | Disposition: A | Discharge status: Home Health | Payer: Self-pay | Source: Home / Self Care | Attending: Orthopedic Surgery

## 2022-06-20 ENCOUNTER — Other Ambulatory Visit: Payer: Self-pay

## 2022-06-20 ENCOUNTER — Ambulatory Visit (HOSPITAL_COMMUNITY): Payer: Medicare HMO | Admitting: Anesthesiology

## 2022-06-20 ENCOUNTER — Encounter (HOSPITAL_COMMUNITY): Payer: Self-pay | Admitting: Orthopedic Surgery

## 2022-06-20 ENCOUNTER — Ambulatory Visit (HOSPITAL_BASED_OUTPATIENT_CLINIC_OR_DEPARTMENT_OTHER): Payer: Medicare HMO | Admitting: Anesthesiology

## 2022-06-20 DIAGNOSIS — F17210 Nicotine dependence, cigarettes, uncomplicated: Secondary | ICD-10-CM | POA: Insufficient documentation

## 2022-06-20 DIAGNOSIS — J45909 Unspecified asthma, uncomplicated: Secondary | ICD-10-CM | POA: Diagnosis not present

## 2022-06-20 DIAGNOSIS — I10 Essential (primary) hypertension: Secondary | ICD-10-CM

## 2022-06-20 DIAGNOSIS — R7303 Prediabetes: Secondary | ICD-10-CM

## 2022-06-20 DIAGNOSIS — M1612 Unilateral primary osteoarthritis, left hip: Principal | ICD-10-CM | POA: Diagnosis present

## 2022-06-20 DIAGNOSIS — Z79899 Other long term (current) drug therapy: Secondary | ICD-10-CM | POA: Insufficient documentation

## 2022-06-20 HISTORY — PX: TOTAL HIP ARTHROPLASTY: SHX124

## 2022-06-20 LAB — TYPE AND SCREEN
ABO/RH(D): AB POS
Antibody Screen: NEGATIVE

## 2022-06-20 SURGERY — ARTHROPLASTY, HIP, TOTAL,POSTERIOR APPROACH
Anesthesia: Spinal | Site: Hip | Laterality: Left

## 2022-06-20 MED ORDER — METHOCARBAMOL 500 MG PO TABS: 500.0000 mg | ORAL_TABLET | Freq: Three times a day (TID) | ORAL | 0 refills | Status: AC | PRN

## 2022-06-20 MED ORDER — MENTHOL 3 MG MT LOZG: 1.0000 | LOZENGE | OROMUCOSAL | Status: DC | PRN

## 2022-06-20 MED ORDER — ACETAMINOPHEN 500 MG PO TABS
1000.0000 mg | ORAL_TABLET | Freq: Once | ORAL | Status: AC
  Administered 2022-06-20: 500 mg via ORAL
  Filled 2022-06-20: qty 2

## 2022-06-20 MED ORDER — OXYCODONE HCL 5 MG PO TABS
ORAL_TABLET | ORAL | Status: AC
  Filled 2022-06-20: qty 1

## 2022-06-20 MED ORDER — PHENYLEPHRINE 80 MCG/ML (10ML) SYRINGE FOR IV PUSH (FOR BLOOD PRESSURE SUPPORT)
PREFILLED_SYRINGE | INTRAVENOUS | Status: DC | PRN
  Administered 2022-06-20: 40 ug via INTRAVENOUS
  Administered 2022-06-20: 80 ug via INTRAVENOUS
  Administered 2022-06-20: 160 ug via INTRAVENOUS
  Administered 2022-06-20: 40 ug via INTRAVENOUS
  Administered 2022-06-20 (×6): 80 ug via INTRAVENOUS

## 2022-06-20 MED ORDER — SODIUM CHLORIDE (PF) 0.9 % IJ SOLN
INTRAMUSCULAR | Status: AC
  Filled 2022-06-20: qty 10

## 2022-06-20 MED ORDER — PHENOL 1.4 % MT LIQD: 1.0000 | OROMUCOSAL | Status: DC | PRN

## 2022-06-20 MED ORDER — OXYCODONE HCL 5 MG PO TABS: 5.0000 mg | ORAL_TABLET | ORAL | 0 refills | Status: DC | PRN

## 2022-06-20 MED ORDER — PROPOFOL 1000 MG/100ML IV EMUL
INTRAVENOUS | Status: AC
  Filled 2022-06-20: qty 100

## 2022-06-20 MED ORDER — DEXMEDETOMIDINE HCL IN NACL 80 MCG/20ML IV SOLN
INTRAVENOUS | Status: DC | PRN
  Administered 2022-06-20 (×2): 4 ug via BUCCAL

## 2022-06-20 MED ORDER — ATORVASTATIN CALCIUM 40 MG PO TABS
40.0000 mg | ORAL_TABLET | Freq: Every day | ORAL | Status: DC
  Administered 2022-06-21: 40 mg via ORAL
  Filled 2022-06-20: qty 1

## 2022-06-20 MED ORDER — SODIUM CHLORIDE 0.9 % IR SOLN
Status: DC | PRN
  Administered 2022-06-20: 4000 mL

## 2022-06-20 MED ORDER — ACETAMINOPHEN 500 MG PO TABS: 1000.0000 mg | ORAL_TABLET | Freq: Four times a day (QID) | ORAL | 0 refills | Status: DC | PRN

## 2022-06-20 MED ORDER — SENNA 8.6 MG PO TABS
1.0000 | ORAL_TABLET | Freq: Two times a day (BID) | ORAL | Status: DC
  Administered 2022-06-20 – 2022-06-21 (×2): 8.6 mg via ORAL
  Filled 2022-06-20 (×2): qty 1

## 2022-06-20 MED ORDER — ACETAMINOPHEN 500 MG PO TABS: 1000.0000 mg | ORAL_TABLET | Freq: Three times a day (TID) | ORAL | Status: DC | PRN

## 2022-06-20 MED ORDER — WATER FOR IRRIGATION, STERILE IR SOLN
Status: DC | PRN
  Administered 2022-06-20: 2000 mL

## 2022-06-20 MED ORDER — BUPIVACAINE LIPOSOME 1.3 % IJ SUSP: 10.0000 mL | Freq: Once | INTRAMUSCULAR | Status: DC

## 2022-06-20 MED ORDER — KETOROLAC TROMETHAMINE 15 MG/ML IJ SOLN
7.5000 mg | Freq: Four times a day (QID) | INTRAMUSCULAR | Status: DC
  Administered 2022-06-20 – 2022-06-21 (×3): 7.5 mg via INTRAVENOUS
  Filled 2022-06-20 (×3): qty 1

## 2022-06-20 MED ORDER — METOCLOPRAMIDE HCL 5 MG PO TABS: 5.0000 mg | ORAL_TABLET | Freq: Three times a day (TID) | ORAL | Status: DC | PRN

## 2022-06-20 MED ORDER — CHLORHEXIDINE GLUCONATE 0.12 % MT SOLN
15.0000 mL | Freq: Once | OROMUCOSAL | Status: AC
  Administered 2022-06-20: 15 mL via OROMUCOSAL

## 2022-06-20 MED ORDER — TRANEXAMIC ACID-NACL 1000-0.7 MG/100ML-% IV SOLN
1000.0000 mg | INTRAVENOUS | Status: AC
  Administered 2022-06-20: 1000 mg via INTRAVENOUS
  Filled 2022-06-20: qty 100

## 2022-06-20 MED ORDER — BUPIVACAINE LIPOSOME 1.3 % IJ SUSP
INTRAMUSCULAR | Status: DC | PRN
  Administered 2022-06-20: 20 mL

## 2022-06-20 MED ORDER — ORAL CARE MOUTH RINSE: 15.0000 mL | Freq: Once | OROMUCOSAL | Status: AC

## 2022-06-20 MED ORDER — DIPHENHYDRAMINE HCL 12.5 MG/5ML PO ELIX: 12.5000 mg | ORAL_SOLUTION | ORAL | Status: DC | PRN

## 2022-06-20 MED ORDER — ASPIRIN 81 MG PO CHEW
81.0000 mg | CHEWABLE_TABLET | Freq: Two times a day (BID) | ORAL | Status: DC
  Administered 2022-06-20 – 2022-06-21 (×2): 81 mg via ORAL
  Filled 2022-06-20 (×2): qty 1

## 2022-06-20 MED ORDER — SODIUM CHLORIDE (PF) 0.9 % IJ SOLN
INTRAMUSCULAR | Status: AC
  Filled 2022-06-20: qty 50

## 2022-06-20 MED ORDER — SODIUM CHLORIDE (PF) 0.9 % IJ SOLN
INTRAMUSCULAR | Status: DC | PRN
  Administered 2022-06-20: 60 mL

## 2022-06-20 MED ORDER — FLUTICASONE PROPIONATE 50 MCG/ACT NA SUSP
1.0000 | Freq: Every day | NASAL | Status: DC
  Filled 2022-06-20: qty 16

## 2022-06-20 MED ORDER — PROPOFOL 500 MG/50ML IV EMUL
INTRAVENOUS | Status: DC | PRN
  Administered 2022-06-20: 100 ug/kg/min via INTRAVENOUS

## 2022-06-20 MED ORDER — PHENYLEPHRINE 80 MCG/ML (10ML) SYRINGE FOR IV PUSH (FOR BLOOD PRESSURE SUPPORT)
PREFILLED_SYRINGE | INTRAVENOUS | Status: AC
  Filled 2022-06-20: qty 20

## 2022-06-20 MED ORDER — POLYETHYLENE GLYCOL 3350 17 G PO PACK: 17.0000 g | PACK | Freq: Every day | ORAL | Status: DC | PRN

## 2022-06-20 MED ORDER — PROPOFOL 10 MG/ML IV BOLUS
INTRAVENOUS | Status: DC | PRN
  Administered 2022-06-20 (×2): 30 mg via INTRAVENOUS
  Administered 2022-06-20: 20 mg via INTRAVENOUS
  Administered 2022-06-20: 10 mg via INTRAVENOUS
  Administered 2022-06-20 (×2): 30 mg via INTRAVENOUS
  Administered 2022-06-20: 20 mg via INTRAVENOUS

## 2022-06-20 MED ORDER — CEFAZOLIN SODIUM-DEXTROSE 2-4 GM/100ML-% IV SOLN
2.0000 g | INTRAVENOUS | Status: AC
  Administered 2022-06-20: 2 g via INTRAVENOUS
  Filled 2022-06-20: qty 100

## 2022-06-20 MED ORDER — GLYCOPYRROLATE 0.2 MG/ML IJ SOLN
INTRAMUSCULAR | Status: DC | PRN
  Administered 2022-06-20 (×2): .1 mg via INTRAVENOUS

## 2022-06-20 MED ORDER — ONDANSETRON HCL 4 MG PO TABS: 4.0000 mg | ORAL_TABLET | Freq: Three times a day (TID) | ORAL | 0 refills | Status: AC | PRN

## 2022-06-20 MED ORDER — ONDANSETRON HCL 4 MG/2ML IJ SOLN
4.0000 mg | Freq: Four times a day (QID) | INTRAMUSCULAR | Status: DC | PRN
  Administered 2022-06-20: 4 mg via INTRAVENOUS
  Filled 2022-06-20: qty 2

## 2022-06-20 MED ORDER — LISINOPRIL 20 MG PO TABS
40.0000 mg | ORAL_TABLET | Freq: Every day | ORAL | Status: DC
  Administered 2022-06-21: 40 mg via ORAL
  Filled 2022-06-20: qty 2

## 2022-06-20 MED ORDER — DOCUSATE SODIUM 100 MG PO CAPS
100.0000 mg | ORAL_CAPSULE | Freq: Two times a day (BID) | ORAL | Status: DC
  Administered 2022-06-20 – 2022-06-21 (×2): 100 mg via ORAL
  Filled 2022-06-20 (×2): qty 1

## 2022-06-20 MED ORDER — ISOPROPYL ALCOHOL 70 % SOLN
Status: DC | PRN
  Administered 2022-06-20: 1 via TOPICAL

## 2022-06-20 MED ORDER — ACETAMINOPHEN 500 MG PO TABS
1000.0000 mg | ORAL_TABLET | Freq: Four times a day (QID) | ORAL | Status: DC
  Administered 2022-06-20 – 2022-06-21 (×3): 1000 mg via ORAL
  Filled 2022-06-20 (×3): qty 2

## 2022-06-20 MED ORDER — DEXAMETHASONE SODIUM PHOSPHATE 10 MG/ML IJ SOLN
INTRAMUSCULAR | Status: AC
  Filled 2022-06-20: qty 1

## 2022-06-20 MED ORDER — 0.9 % SODIUM CHLORIDE (POUR BTL) OPTIME
TOPICAL | Status: DC | PRN
  Administered 2022-06-20: 1000 mL

## 2022-06-20 MED ORDER — POVIDONE-IODINE 10 % EX SWAB: 2.0000 | Freq: Once | CUTANEOUS | Status: DC

## 2022-06-20 MED ORDER — AMLODIPINE BESYLATE 5 MG PO TABS
5.0000 mg | ORAL_TABLET | Freq: Every day | ORAL | Status: DC
  Administered 2022-06-21: 5 mg via ORAL
  Filled 2022-06-20: qty 1

## 2022-06-20 MED ORDER — ACETAMINOPHEN 325 MG PO TABS: 325.0000 mg | ORAL_TABLET | Freq: Four times a day (QID) | ORAL | Status: DC | PRN

## 2022-06-20 MED ORDER — ONDANSETRON HCL 4 MG/2ML IJ SOLN
INTRAMUSCULAR | Status: AC
  Filled 2022-06-20: qty 2

## 2022-06-20 MED ORDER — FENTANYL CITRATE PF 50 MCG/ML IJ SOSY
PREFILLED_SYRINGE | INTRAMUSCULAR | Status: AC
  Filled 2022-06-20: qty 2

## 2022-06-20 MED ORDER — AMISULPRIDE (ANTIEMETIC) 5 MG/2ML IV SOLN: 10.0000 mg | Freq: Once | INTRAVENOUS | Status: DC | PRN

## 2022-06-20 MED ORDER — ONDANSETRON HCL 4 MG PO TABS
4.0000 mg | ORAL_TABLET | Freq: Four times a day (QID) | ORAL | Status: DC | PRN
  Administered 2022-06-21: 4 mg via ORAL
  Filled 2022-06-20: qty 1

## 2022-06-20 MED ORDER — HYDROMORPHONE HCL 1 MG/ML IJ SOLN
0.5000 mg | INTRAMUSCULAR | Status: DC | PRN
  Administered 2022-06-20: 1 mg via INTRAVENOUS
  Filled 2022-06-20: qty 1

## 2022-06-20 MED ORDER — LACTATED RINGERS IV SOLN: INTRAVENOUS | Status: DC

## 2022-06-20 MED ORDER — DEXAMETHASONE SODIUM PHOSPHATE 10 MG/ML IJ SOLN
INTRAMUSCULAR | Status: DC | PRN
  Administered 2022-06-20: 10 mg via INTRAVENOUS

## 2022-06-20 MED ORDER — SODIUM CHLORIDE 0.9 % IV SOLN: INTRAVENOUS | Status: DC

## 2022-06-20 MED ORDER — OXYCODONE HCL 5 MG PO TABS
5.0000 mg | ORAL_TABLET | ORAL | Status: DC | PRN
  Administered 2022-06-20: 10 mg via ORAL
  Administered 2022-06-20: 5 mg via ORAL
  Administered 2022-06-21: 10 mg via ORAL
  Filled 2022-06-20: qty 1
  Filled 2022-06-20 (×2): qty 2

## 2022-06-20 MED ORDER — BUPIVACAINE LIPOSOME 1.3 % IJ SUSP
INTRAMUSCULAR | Status: AC
  Filled 2022-06-20: qty 20

## 2022-06-20 MED ORDER — ONDANSETRON HCL 4 MG/2ML IJ SOLN
INTRAMUSCULAR | Status: DC | PRN
  Administered 2022-06-20: 4 mg via INTRAVENOUS

## 2022-06-20 MED ORDER — OXYCODONE-ACETAMINOPHEN 5-325 MG PO TABS: 1.0000 | ORAL_TABLET | ORAL | 0 refills | Status: AC | PRN

## 2022-06-20 MED ORDER — METOCLOPRAMIDE HCL 5 MG/ML IJ SOLN
5.0000 mg | Freq: Three times a day (TID) | INTRAMUSCULAR | Status: DC | PRN
  Administered 2022-06-20 – 2022-06-21 (×2): 5 mg via INTRAVENOUS
  Filled 2022-06-20 (×2): qty 2

## 2022-06-20 MED ORDER — FENTANYL CITRATE PF 50 MCG/ML IJ SOSY
25.0000 ug | PREFILLED_SYRINGE | INTRAMUSCULAR | Status: DC | PRN
  Administered 2022-06-20: 50 ug via INTRAVENOUS
  Administered 2022-06-20: 25 ug via INTRAVENOUS
  Administered 2022-06-20: 50 ug via INTRAVENOUS

## 2022-06-20 MED ORDER — GLYCOPYRROLATE 0.2 MG/ML IJ SOLN
INTRAMUSCULAR | Status: AC
  Filled 2022-06-20: qty 1

## 2022-06-20 MED ORDER — METHOCARBAMOL 500 MG PO TABS
500.0000 mg | ORAL_TABLET | Freq: Four times a day (QID) | ORAL | Status: DC | PRN
  Administered 2022-06-20: 500 mg via ORAL
  Filled 2022-06-20: qty 1

## 2022-06-20 MED ORDER — CEFAZOLIN SODIUM-DEXTROSE 2-4 GM/100ML-% IV SOLN
2.0000 g | Freq: Four times a day (QID) | INTRAVENOUS | Status: AC
  Administered 2022-06-20 (×2): 2 g via INTRAVENOUS
  Filled 2022-06-20 (×2): qty 100

## 2022-06-20 MED ORDER — PANTOPRAZOLE SODIUM 40 MG PO TBEC
40.0000 mg | DELAYED_RELEASE_TABLET | Freq: Every day | ORAL | Status: DC
  Administered 2022-06-20 – 2022-06-21 (×2): 40 mg via ORAL
  Filled 2022-06-20 (×3): qty 1

## 2022-06-20 MED ORDER — BUPIVACAINE IN DEXTROSE 0.75-8.25 % IT SOLN
INTRATHECAL | Status: DC | PRN
  Administered 2022-06-20: 2 mL via INTRATHECAL

## 2022-06-20 MED ORDER — CELECOXIB 100 MG PO CAPS: 100.0000 mg | ORAL_CAPSULE | Freq: Two times a day (BID) | ORAL | 0 refills | Status: AC

## 2022-06-20 MED ORDER — METHOCARBAMOL 1000 MG/10ML IJ SOLN: 500.0000 mg | Freq: Four times a day (QID) | INTRAVENOUS | Status: DC | PRN

## 2022-06-20 SURGICAL SUPPLY — 69 items
ADH SKN CLS APL DERMABOND .7 (GAUZE/BANDAGES/DRESSINGS) ×1
APL PRP STRL LF DISP 70% ISPRP (MISCELLANEOUS) ×2
BAG COUNTER SPONGE SURGICOUNT (BAG) IMPLANT
BAG DECANTER FOR FLEXI CONT (MISCELLANEOUS) ×1 IMPLANT
BAG SPEC THK2 15X12 ZIP CLS (MISCELLANEOUS) ×1
BAG SPNG CNTER NS LX DISP (BAG)
BAG ZIPLOCK 12X15 (MISCELLANEOUS) ×1 IMPLANT
BLADE SAW SAG 25X90X1.19 (BLADE) ×1 IMPLANT
CHLORAPREP W/TINT 26 (MISCELLANEOUS) ×2 IMPLANT
COVER SURGICAL LIGHT HANDLE (MISCELLANEOUS) ×1 IMPLANT
DERMABOND ADVANCED .7 DNX12 (GAUZE/BANDAGES/DRESSINGS) ×1 IMPLANT
DRAPE HIP W/POCKET STRL (MISCELLANEOUS) ×1 IMPLANT
DRAPE INCISE IOBAN 66X45 STRL (DRAPES) ×1 IMPLANT
DRAPE INCISE IOBAN 85X60 (DRAPES) ×1 IMPLANT
DRAPE POUCH INSTRU U-SHP 10X18 (DRAPES) ×1 IMPLANT
DRAPE SHEET LG 3/4 BI-LAMINATE (DRAPES) ×3 IMPLANT
DRAPE SURG 17X11 SM STRL (DRAPES) ×1 IMPLANT
DRAPE U-SHAPE 47X51 STRL (DRAPES) ×2 IMPLANT
DRESSING AQUACEL AG SP 3.5X10 (GAUZE/BANDAGES/DRESSINGS) ×1 IMPLANT
DRSG AQUACEL AG SP 3.5X10 (GAUZE/BANDAGES/DRESSINGS) ×1
ELECT BLADE TIP CTD 4 INCH (ELECTRODE) ×1 IMPLANT
ELECT REM PT RETURN 15FT ADLT (MISCELLANEOUS) ×1 IMPLANT
GLOVE BIO SURGEON STRL SZ 6.5 (GLOVE) ×2 IMPLANT
GLOVE BIOGEL PI IND STRL 6.5 (GLOVE) ×1 IMPLANT
GLOVE BIOGEL PI IND STRL 8 (GLOVE) ×1 IMPLANT
GLOVE SURG ORTHO 8.0 STRL STRW (GLOVE) ×2 IMPLANT
GOWN STRL REUS W/ TWL XL LVL3 (GOWN DISPOSABLE) ×2 IMPLANT
GOWN STRL REUS W/TWL XL LVL3 (GOWN DISPOSABLE) ×2
HANDPIECE INTERPULSE COAX TIP (DISPOSABLE)
HEAD BIOLOX HIP 36/+2.5 (Joint) IMPLANT
HIP BIOLOX HD 36/+2.5 (Joint) ×1 IMPLANT
HOLDER FOLEY CATH W/STRAP (MISCELLANEOUS) ×1 IMPLANT
HOOD PEEL AWAY T7 (MISCELLANEOUS) ×3 IMPLANT
INSERT 0 DEGREE 36 (Miscellaneous) IMPLANT
JET LAVAGE IRRISEPT WOUND (IRRIGATION / IRRIGATOR)
KIT BASIN OR (CUSTOM PROCEDURE TRAY) ×1 IMPLANT
KIT TURNOVER KIT A (KITS) IMPLANT
LAVAGE JET IRRISEPT WOUND (IRRIGATION / IRRIGATOR) IMPLANT
MANIFOLD NEPTUNE II (INSTRUMENTS) ×1 IMPLANT
MARKER SKIN DUAL TIP RULER LAB (MISCELLANEOUS) ×1 IMPLANT
NDL HYPO 22X1.5 SAFETY MO (MISCELLANEOUS) IMPLANT
NEEDLE HYPO 22X1.5 SAFETY MO (MISCELLANEOUS) IMPLANT
NS IRRIG 1000ML POUR BTL (IV SOLUTION) ×1 IMPLANT
PACK TOTAL JOINT (CUSTOM PROCEDURE TRAY) ×1 IMPLANT
PRESSURIZER FEMORAL UNIV (MISCELLANEOUS) IMPLANT
PROTECTOR NERVE ULNAR (MISCELLANEOUS) ×1 IMPLANT
RETRIEVER SUT HEWSON (MISCELLANEOUS) ×1 IMPLANT
SCREW HEX LP 6.5X30 (Screw) IMPLANT
SEALER BIPOLAR AQUA 6.0 (INSTRUMENTS) ×1 IMPLANT
SET HNDPC FAN SPRY TIP SCT (DISPOSABLE) IMPLANT
SHELL ACETAB TRIDENT 48 (Shell) IMPLANT
SPIKE FLUID TRANSFER (MISCELLANEOUS) ×3 IMPLANT
STEM FEM ACCOLADE 38X102X30 S3 (Stem) IMPLANT
SUCTION FRAZIER HANDLE 12FR (TUBING) ×1
SUCTION TUBE FRAZIER 12FR DISP (TUBING) ×1 IMPLANT
SUT BONE WAX W31G (SUTURE) ×1 IMPLANT
SUT ETHIBOND #5 BRAIDED 30INL (SUTURE) ×1 IMPLANT
SUT MNCRL AB 3-0 PS2 18 (SUTURE) ×1 IMPLANT
SUT STRATAFIX 0 PDS 27 VIOLET (SUTURE) ×1
SUT STRATAFIX PDO 1 14 VIOLET (SUTURE) ×1
SUT STRATFX PDO 1 14 VIOLET (SUTURE) ×1
SUT VIC AB 2-0 CT2 27 (SUTURE) ×2 IMPLANT
SUTURE STRATFX 0 PDS 27 VIOLET (SUTURE) ×1 IMPLANT
SUTURE STRATFX PDO 1 14 VIOLET (SUTURE) ×1 IMPLANT
SYR 20ML LL LF (SYRINGE) ×2 IMPLANT
TOWEL OR 17X26 10 PK STRL BLUE (TOWEL DISPOSABLE) ×1 IMPLANT
TRAY FOLEY MTR SLVR 16FR STAT (SET/KITS/TRAYS/PACK) ×1 IMPLANT
UNDERPAD 30X36 HEAVY ABSORB (UNDERPADS AND DIAPERS) ×1 IMPLANT
WATER STERILE IRR 1000ML POUR (IV SOLUTION) ×2 IMPLANT

## 2022-06-20 NOTE — Transfer of Care (Signed)
Immediate Anesthesia Transfer of Care Note  Patient: Cynthia Hess  Procedure(s) Performed: Procedure(s): TOTAL HIP ARTHROPLASTY (Left)  Patient Location: PACU  Anesthesia Type:MAC and Spinal  Level of Consciousness: Patient easily awoken, sedated, comfortable, cooperative, following commands, responds to stimulation.   Airway & Oxygen Therapy: Patient spontaneously breathing, ventilating well, oxygen via simple oxygen mask.  Post-op Assessment: Report given to PACU RN, vital signs reviewed and stable.   Post vital signs: Reviewed and stable.  Complications: No apparent anesthesia complications  Last Vitals:  Vitals Value Taken Time  BP    Temp    Pulse 94 06/20/22 1447  Resp 17 06/20/22 1447  SpO2 100 % 06/20/22 1447  Vitals shown include unvalidated device data.  Last Pain:  Vitals:   06/20/22 1009  TempSrc: Oral         Complications: No notable events documented.

## 2022-06-20 NOTE — Evaluation (Signed)
Physical Therapy Evaluation Patient Details Name: Cynthia Hess MRN: [id number] DOB: [birth year] Today's Date: 06/20/2022  History of Present Illness  67 yo female presents to therapy s/p L THA, posteriorlateral approach on 06/20/2022 with no posterior hip precuations. Pt has PMH including but not limited to: chronic back pain s/p lumbar fusion, cervical  DDD with neuropathy, tobacco abuse, HTN and HDL.  Clinical Impression    Cynthia Hess is a 67 y.o. female POD 0 s/p L THA. Patient reports INd with mobility at baseline. Patient is now limited by functional impairments (see PT problem list below) and requires min A for bed mobility and min guard for transfers. Patient was able to ambulate 18 feet with RW and min guard and cues level of assist. Patient instructed in exercise to facilitate ROM and circulation to manage edema. Patient will benefit from continued skilled PT interventions to address impairments and progress towards PLOF. Acute PT will follow to progress mobility and stair training in preparation for safe discharge home.      Recommendations for follow up therapy are one component of a multi-disciplinary discharge planning process, led by the attending physician.  Recommendations may be updated based on patient status, additional functional criteria and insurance authorization.  Follow Up Recommendations Outpatient PT      Assistance Recommended at Discharge    Patient can return home with the following  A little help with walking and/or transfers;A little help with bathing/dressing/bathroom;Assistance with cooking/housework;Assist for transportation;Help with stairs or ramp for entrance    Equipment Recommendations Rolling walker (2 wheels)  Recommendations for Other Services       Functional Status Assessment Patient has had a recent decline in their functional status and demonstrates the ability to make significant improvements in function in a reasonable and predictable amount  of time.     Precautions / Restrictions Precautions Precautions: Fall Precaution Comments: No posterior hip precuations' Restrictions Weight Bearing Restrictions: No      Mobility  Bed Mobility Overal bed mobility: Needs Assistance Bed Mobility: Supine to Sit     Supine to sit: HOB elevated, Min assist (increased time)     General bed mobility comments: use of R bed rail    Transfers Overall transfer level: Needs assistance Equipment used: Rolling walker (2 wheels) Transfers: Sit to/from Stand Sit to Stand: Min assist           General transfer comment: cues for proper UE placement    Ambulation/Gait Ambulation/Gait assistance: Min guard Gait Distance (Feet): 18 Feet Assistive device: Rolling walker (2 wheels) Gait Pattern/deviations: Step-to pattern, Antalgic, Trunk flexed Gait velocity: decreased     General Gait Details: cues for maintaining proper body position inside RW  Stairs            Wheelchair Mobility    Modified Rankin (Stroke Patients Only)       Balance Overall balance assessment: Needs assistance Sitting-balance support: Single extremity supported, Feet supported Sitting balance-Leahy Scale: Fair     Standing balance support: During functional activity, Reliant on assistive device for balance Standing balance-Leahy Scale: Poor                               Pertinent Vitals/Pain Pain Assessment Pain Assessment: 0-10 Pain Score: 8  Pain Location: L hip Pain Descriptors / Indicators: Burning, Aching, Constant, Operative site guarding Pain Intervention(s): Limited activity within patient's tolerance, Monitored during session, Premedicated before session, Ice applied  Home Living Family/patient expects to be discharged to:: Private residence Living Arrangements: Alone Available Help at Discharge: Family;Available 24 hours/day Type of Home: Apartment Home Access: Elevator;Level entry       Home Layout: One  level Home Equipment: Cane - single point      Prior Function Prior Level of Function : Independent/Modified Independent             Mobility Comments: IND with all ADLs, self care tasks and  family assists with transportation and IADLs       Hand Dominance        Extremity/Trunk Assessment        Lower Extremity Assessment Lower Extremity Assessment: LLE deficits/detail LLE Deficits / Details: ankle DF/PF 4+/5 LLE Sensation: WNL       Communication   Communication: No difficulties  Cognition Arousal/Alertness: Awake/alert Behavior During Therapy: WFL for tasks assessed/performed Overall Cognitive Status: Within Functional Limits for tasks assessed                                          General Comments      Exercises Total Joint Exercises Ankle Circles/Pumps: AROM, Both, 20 reps   Assessment/Plan    PT Assessment Patient needs continued PT services  PT Problem List Decreased strength;Decreased range of motion;Decreased activity tolerance;Decreased balance;Decreased mobility;Decreased coordination;Decreased knowledge of use of DME;Pain       PT Treatment Interventions DME instruction;Gait training;Stair training;Functional mobility training;Therapeutic activities;Therapeutic exercise;Balance training;Neuromuscular re-education;Patient/family education;Modalities    PT Goals (Current goals can be found in the Care Plan section)  Acute Rehab PT Goals Patient Stated Goal: to be able to walk downtown PT Goal Formulation: With patient Time For Goal Achievement: 07/04/22 Potential to Achieve Goals: Good    Frequency 7X/week     Co-evaluation               AM-PAC PT "6 Clicks" Mobility  Outcome Measure Help needed turning from your back to your side while in a flat bed without using bedrails?: A Little Help needed moving from lying on your back to sitting on the side of a flat bed without using bedrails?: A Little Help needed  moving to and from a bed to a chair (including a wheelchair)?: A Little Help needed standing up from a chair using your arms (e.g., wheelchair or bedside chair)?: A Little Help needed to walk in hospital room?: A Little Help needed climbing 3-5 steps with a railing? : Total 6 Click Score: 16    End of Session Equipment Utilized During Treatment: Gait belt Activity Tolerance: Patient tolerated treatment well Patient left: in chair;with call bell/phone within reach Nurse Communication: Mobility status PT Visit Diagnosis: Unsteadiness on feet (R26.81);Other abnormalities of gait and mobility (R26.89);Muscle weakness (generalized) (M62.81);Pain Pain - Right/Left: Left Pain - part of body: Hip;Leg    Time: [id number] PT Time Calculation (min) (ACUTE ONLY): 36 min   Charges:   PT Evaluation $PT Eval Low Complexity: 1 Low PT Treatments $Therapeutic Activity: 8-22 mins        Baird Lyons, PT   Adair Patter 06/20/2022, 7:23 PM

## 2022-06-20 NOTE — Progress Notes (Signed)
Pacu RN Report to floor given  Gave report to  [employer]. Room: 1331  Discussed surgery, meds given in OR and Pacu, VS, IV fluids given, EBL, urine output, pain and other pertinent information. Also discussed if pt had any family or friends here or belongings with them.   Discussed VSS, pain and meds given, +2 DP pulse on L foot, wiggling toes, knees bent, moving bottom. Pt's daughter will come to see pt in an hour. Dinner was ordered. Pt was a bit tearful, but feels better now.   Pt exits my care.

## 2022-06-20 NOTE — Anesthesia Postprocedure Evaluation (Signed)
Anesthesia Post Note  Patient: Cynthia Hess  Procedure(s) Performed: TOTAL HIP ARTHROPLASTY (Left: Hip)     Patient location during evaluation: PACU Anesthesia Type: Spinal Level of consciousness: oriented and awake and alert Pain management: pain level controlled Vital Signs Assessment: post-procedure vital signs reviewed and stable Respiratory status: spontaneous breathing, respiratory function stable, patient connected to nasal cannula oxygen and nonlabored ventilation Cardiovascular status: blood pressure returned to baseline and stable Postop Assessment: no headache, no backache, no apparent nausea or vomiting, spinal receding and patient able to bend at knees Anesthetic complications: no   No notable events documented.  Last Vitals:  Vitals:   06/20/22 1445 06/20/22 1500  BP:  (!) 169/149  Pulse: 95 65  Resp: (!) 26 15  Temp: 36.4 C   SpO2: 100% 100%    Last Pain:  Vitals:   06/20/22 1500  TempSrc:   PainSc: 8    Pain Goal: Patients Stated Pain Goal: 3 (06/20/22 1500)                 Teresa Nicodemus A.

## 2022-06-20 NOTE — Anesthesia Procedure Notes (Signed)
Procedure Name: MAC Date/Time: 06/20/2022 12:11 PM  Performed by: Deliah Boston, CRNAPre-anesthesia Checklist: Patient identified, Emergency Drugs available, Suction available and Patient being monitored Patient Re-evaluated:Patient Re-evaluated prior to induction Oxygen Delivery Method: Simple face mask Preoxygenation: Pre-oxygenation with 100% oxygen Induction Type: IV induction Placement Confirmation: positive ETCO2 and breath sounds checked- equal and bilateral

## 2022-06-20 NOTE — Anesthesia Procedure Notes (Addendum)
Spinal  Patient location during procedure: OR Start time: 06/20/2022 12:08 PM End time: 06/20/2022 12:13 PM Reason for block: surgical anesthesia Staffing Performed: anesthesiologist  Anesthesiologist: Suzette Battiest, MD Performed by: Suzette Battiest, MD Authorized by: Suzette Battiest, MD   Preanesthetic Checklist Completed: patient identified, IV checked, site marked, risks and benefits discussed, surgical consent, monitors and equipment checked, pre-op evaluation and timeout performed Spinal Block Patient position: sitting Prep: DuraPrep Patient monitoring: heart rate, cardiac monitor, continuous pulse ox and blood pressure Approach: midline Location: L3-4 Injection technique: single-shot Needle Needle type: Pencan  Needle gauge: 24 G Needle length: 9 cm Assessment Sensory level: T4 Events: CSF return

## 2022-06-20 NOTE — Discharge Instructions (Signed)

## 2022-06-20 NOTE — Op Note (Addendum)
06/20/2022  1:53 PM  PATIENT:  Cynthia Hess   MRN: [id number]  PRE-OPERATIVE DIAGNOSIS:  End-stage left hip osteoarthritis  POST-OPERATIVE DIAGNOSIS:  same  PROCEDURE:  Procedure(s): TOTAL HIP ARTHROPLASTY  PREOPERATIVE INDICATIONS:    Cynthia Hess is an 67 y.o. female who has a diagnosis of End-stage left hip osteoarthritis and elected for surgical management after failing conservative treatment.  The risks benefits and alternatives were discussed with the patient including but not limited to the risks of nonoperative treatment, versus surgical intervention including infection, bleeding, nerve injury, periprosthetic fracture, the need for revision surgery, dislocation, leg length discrepancy, blood clots, cardiopulmonary complications, morbidity, mortality, among others, and they were willing to proceed.     OPERATIVE REPORT     SURGEON:  Charlies Constable, MD    ASSISTANT: Dorise Bullion, PA-C, (Present throughout the entire procedure,  necessary for completion of procedure in a timely manner, assisting with retraction, instrumentation, and closure)     ANESTHESIA: Spinal  ESTIMATED BLOOD LOSS: [id number]    COMPLICATIONS:  None.      COMPONENTS:   [employer] Trident 2 TriTanium 48 mm acetabular shell, 6.5 hex screws x 1, Trident X.3 polyneutral liner, Accolade 2 with 127 degree neck angle size #3, 36+2.5 ceramic head Implant Name Type Inv. Item Serial No. Manufacturer Lot No. LRB No. Used Action  SHELL ACETAB TRIDENT 48 - [id number] Shell SHELL ACETAB TRIDENT 48  [employer] [id number] A Left 1 Implanted  SCREW HEX LP 6.5X30 - [id number] Screw SCREW HEX LP 6.5X30  [employer] [id number] Left 1 Implanted  INSERT 0 DEGREE 36 - [id number] Miscellaneous INSERT 0 DEGREE 36  [employer] [id number] Left 1 Implanted  STEM FEM ACCOLADE [id number] S3 - [id number] Stem STEM FEM ACCOLADE [id number] S3  [employer] [id number] A Left 1 Implanted  HIP BIOLOX HD 36/+2.5 -  [id number] Joint HIP BIOLOX HD 36/+2.5  [employer] [id number] Left 1 Implanted      PROCEDURE IN DETAIL:   The patient was met in the holding area and  identified.  The appropriate hip was identified and marked at the operative site.  The patient was then transported to the OR  and  placed under anesthesia.  At that point, the patient was  placed in the lateral decubitus position with the operative side up and  secured to the operating room table  and all bony prominences padded. A subaxillary role was also placed.    The operative lower extremity was prepped from the iliac crest to the distal leg.  Sterile draping was performed.  Preoperative antibiotics, 2 gm of ancef,1 gm of Tranexamic Acid, and 8 mg of Decadron administered. Time out was performed prior to incision.      A routine posterolateral approach was utilized via sharp dissection  carried down to the subcutaneous tissue.  Gross bleeders were Bovie coagulated.  The iliotibial band was identified and incised along the length of the skin incision through the glute max fascia.  Charnley retractor was placed with care to protect the sciatic nerve posteriorly.  With the hip internally rotated, the piriformis tendon was identified and released from the femoral insertion and tagged with a #5 Ethibond.  A capsulotomy was then performed off the femoral insertion and also tagged with a #5 Ethibond.    The femoral neck was exposed, and I resected the femoral neck based on preoperative templating relative to the lesser trochanter.    I then exposed the deep acetabulum, cleared out any tissue including the  ligamentum teres.  After adequate visualization, I excised the labrum.  I then started reaming with a 44 mm reamer, first medializing to the floor of the cotyloid fossa, and then in the position of the cup aiming towards the greater sciatic notch, matching the version of the transverse acetabular ligament and tucked under the anterior wall. I  reamed up to 48 mm reamer with good bony bed preparation and a 76mm cup was chosen.  The real cup was then impacted into place.  Appropriate version and inclination was confirmed clinically matching their bony anatomy, and also with the use of the jig.  I placed 1 screw in the posterior superior quadrant to augment fixation.  A neutral liner was placed and impacted. It was confirmed to be appropriately seated and the acetabular retractors were removed.    I then prepared the proximal femur using the box cutter, Charnley awl, and then sequentially broached starting with 0 up to a size 2.  A trial broach, neck, and head was utilized, and I reduced the hip and it was found to have excellent stability.  There was no impingement with full extension and 90 degrees external rotation.  The hip was stable at the position of sleep and with 90 degrees flexion and 90 degrees of internal rotation.  Leg lengths were also clinically assessed in the lateral position and felt to be equal. Intra-Op flatplate was obtained and confirmed appropriate component positions.  Good restoration of leg length and offset. No evidence or concern for fracture. Size 2 broach appeared a bit undersized on the fluoroscopy and some varus was able to work laterally and upsized to a #3 which had excellent fit and stability.  A final femoral prosthesis size 3 was selected. I then impacted the real femoral prosthesis into place.I again trialed and selected a 36+ 2.25mm ball. The hip was then reduced and taken through a range of motion. There was no impingement with full extension and 90 degrees external rotation.  The hip was stable at the position of sleep and with 90 degrees flexion and 90 degrees of internal rotation. Leg lengths were  again assessed and felt to be restored.  We then opened, and I impacted the real head ball into place.  The posterior capsule was then closed with #5 Ethibond.  The piriformis was repaired through the base of  the abductor tendon using a [place] suture passer.  I then irrigated the hip copiously with dilute Betadine and with normal saline pulse lavage. Periarticular injection was then performed with Exparel.   We repaired the fascia #1 barbed suture, followed by 0 barbed suture for the subcutaneous fat.  Skin was closed with 2-0 Vicryl and 3-0 Monocryl.  Dermabond and Aquacel dressing were applied. The patient was then awakened and returned to PACU in stable and satisfactory condition.  Leg lengths in the supine position were assessed and felt to be clinically equal. There were no complications.  Post op recs: WB: WBAT LLE, No formal hip precautions Abx: ancef Imaging: PACU pelvis Xray Dressing: Aquacell, keep intact until follow up DVT prophylaxis: Aspirin 81BID starting POD1 Follow up: 2 weeks after surgery for a wound check with Dr. Zachery Dakins at [facility].  Address: [address], [place], [place] [address]  Office Phone: [phone]   Charlies Constable, MD Orthopedic Surgeon

## 2022-06-20 NOTE — Interval H&P Note (Signed)
The patient has been re-examined, and the chart reviewed, and there have been no interval changes to the documented history and physical.    Plan for L THA for Left hip OA  The operative side was examined and the patient was confirmed to have sensation to DPN, SPN, TN intact, Motor EHL, ext, flex 5/5, and DP 2+, PT 2+, No significant edema.   The risks, benefits, and alternatives have been discussed at length with patient, and the patient is willing to proceed.  Left hip marked. Consent has been signed.

## 2022-06-20 NOTE — Anesthesia Preprocedure Evaluation (Signed)
Anesthesia Evaluation  Patient identified by MRN, date of birth, ID band Patient awake    Reviewed: Allergy & Precautions, NPO status , Patient's Chart, lab work & pertinent test results  Airway Mallampati: II  TM Distance: <3 FB Neck ROM: Full    Dental   Pulmonary asthma , Current Smoker and Patient abstained from smoking.   breath sounds clear to auscultation       Cardiovascular hypertension, Pt. on medications  Rhythm:Regular Rate:Normal     Neuro/Psych  Neuromuscular disease    GI/Hepatic negative GI ROS, Neg liver ROS,,,  Endo/Other  negative endocrine ROS    Renal/GU negative Renal ROS     Musculoskeletal   Abdominal   Peds  Hematology   Anesthesia Other Findings   Reproductive/Obstetrics                              Lab Results  Component Value Date   WBC 7.9 06/11/2022   HGB 12.6 06/11/2022   HCT 38.3 06/11/2022   MCV 98.2 06/11/2022   PLT 329 06/11/2022   Lab Results  Component Value Date   CREATININE 0.66 06/11/2022   BUN 10 06/11/2022   NA 142 06/11/2022   K 3.2 (L) 06/11/2022   CL 108 06/11/2022   CO2 25 06/11/2022    Anesthesia Physical Anesthesia Plan  ASA: 3  Anesthesia Plan: Spinal   Post-op Pain Management: Tylenol PO (pre-op)*   Induction:   PONV Risk Score and Plan: 1 and Propofol infusion, Dexamethasone, Ondansetron and Treatment may vary due to age or medical condition  Airway Management Planned: Natural Airway and Simple Face Mask  Additional Equipment: None  Intra-op Plan:   Post-operative Plan:   Informed Consent: I have reviewed the patients History and Physical, chart, labs and discussed the procedure including the risks, benefits and alternatives for the proposed anesthesia with the patient or authorized representative who has indicated his/her understanding and acceptance.       Plan Discussed with: CRNA  Anesthesia Plan  Comments:          Anesthesia Quick Evaluation

## 2022-06-21 DIAGNOSIS — M1612 Unilateral primary osteoarthritis, left hip: Secondary | ICD-10-CM | POA: Diagnosis not present

## 2022-06-21 LAB — BASIC METABOLIC PANEL
Anion gap: 11 (ref 5–15)
BUN: 15 mg/dL (ref 8–23)
CO2: 22 mmol/L (ref 22–32)
Calcium: 8.7 mg/dL — ABNORMAL LOW (ref 8.9–10.3)
Chloride: 103 mmol/L (ref 98–111)
Creatinine, Ser: 0.63 mg/dL (ref 0.44–1.00)
GFR, Estimated: >60 mL/min (ref >60)
Glucose, Bld: 143 mg/dL — ABNORMAL HIGH (ref 70–99)
Potassium: 3.1 mmol/L — ABNORMAL LOW (ref 3.5–5.1)
Sodium: 136 mmol/L (ref 135–145)

## 2022-06-21 LAB — CBC
HCT: 33 % — ABNORMAL LOW (ref 36.0–46.0)
Hemoglobin: 11.2 g/dL — ABNORMAL LOW (ref 12.0–15.0)
MCH: 33.2 pg (ref 26.0–34.0)
MCHC: 33.9 g/dL (ref 30.0–36.0)
MCV: 97.9 fL (ref 80.0–100.0)
Platelets: 305 10*3/uL (ref 150–400)
RBC: 3.37 MIL/uL — ABNORMAL LOW (ref 3.87–5.11)
RDW: 12.8 % (ref 11.5–15.5)
WBC: 12.6 10*3/uL — ABNORMAL HIGH (ref 4.0–10.5)
nRBC: 0 % (ref 0.0–0.2)

## 2022-06-21 NOTE — Progress Notes (Signed)
Patient has passed PT but so far has had two emesis episodes during this shift. Also one emesis episode during the night shift. IV Reglan helped patient's nausea prior to this morning's PT. Attempted to give patient her morning meds and then she threw them back up. Notified Aggie Moats, PA to inform about patient's nausea/vomiting episodes. PA coming to round on this patient in a bit.

## 2022-06-21 NOTE — Progress Notes (Signed)
Physical Therapy Treatment Patient Details Name: Cynthia Hess MRN: [id number] DOB: 27-Jul-1955 Today's Date: 06/21/2022   History of Present Illness 67 yo female presents to therapy s/p L THA, posteriorlateral approach on 06/20/2022 with no posterior hip precuations. Pt has PMH including but not limited to: chronic back pain s/p lumbar fusion, cervical  DDD with neuropathy, tobacco abuse, HTN and HDL.    PT Comments    Pt ambulated in hallway and performed LE exercises.  Pt provided with HEP handout and had no further questions.  Pt has elevator to apartment and then only one level so no stairs/steps.  Pt ready for d/c home today.   Recommendations for follow up therapy are one component of a multi-disciplinary discharge planning process, led by the attending physician.  Recommendations may be updated based on patient status, additional functional criteria and insurance authorization.  Follow Up Recommendations  Follow physician's recommendations for discharge plan and follow up therapies     Assistance Recommended at Discharge    Patient can return home with the following A little help with walking and/or transfers;A little help with bathing/dressing/bathroom;Assistance with cooking/housework;Assist for transportation;Help with stairs or ramp for entrance   Equipment Recommendations  Rolling walker (2 wheels)    Recommendations for Other Services       Precautions / Restrictions Precautions Precautions: Fall Precaution Comments: No posterior hip precuations Restrictions LLE Weight Bearing: Weight bearing as tolerated     Mobility  Bed Mobility               General bed mobility comments: pt in recliner    Transfers Overall transfer level: Needs assistance Equipment used: Rolling walker (2 wheels) Transfers: Sit to/from Stand Sit to Stand: Min guard           General transfer comment: cues for hand placement    Ambulation/Gait Ambulation/Gait assistance:  Min guard Gait Distance (Feet): 100 Feet Assistive device: Rolling walker (2 wheels) Gait Pattern/deviations: Step-to pattern, Antalgic, Trunk flexed, Decreased stance time - left Gait velocity: decreased     General Gait Details: verbal cues for sequence, RW positioning, step length, posture   Stairs             Wheelchair Mobility    Modified Rankin (Stroke Patients Only)       Balance                                            Cognition Arousal/Alertness: Awake/alert Behavior During Therapy: WFL for tasks assessed/performed Overall Cognitive Status: Within Functional Limits for tasks assessed                                          Exercises Total Joint Exercises Ankle Circles/Pumps: AROM, Both, 20 reps Quad Sets: AROM, Both Heel Slides: AAROM, Left, 10 reps Hip ABduction/ADduction: AAROM, AROM, Left, Standing, Supine, 10 reps Long Arc Quad: AROM, Left, 10 reps, Seated Knee Flexion: AROM, Left, 10 reps, Standing Marching in Standing: AROM, Standing, Left, 10 reps    General Comments        Pertinent Vitals/Pain Pain Assessment Pain Assessment: 0-10 Pain Score: 6  Pain Location: L hip Pain Descriptors / Indicators: Burning, Aching, Constant, Sore Pain Intervention(s): Repositioned, Monitored during session (wanted to do therapy and then get  pain meds if needed after)    Home Living                          Prior Function            PT Goals (current goals can now be found in the care plan section) Progress towards PT goals: Progressing toward goals    Frequency    7X/week      PT Plan Current plan remains appropriate    Co-evaluation              AM-PAC PT "6 Clicks" Mobility   Outcome Measure  Help needed turning from your back to your side while in a flat bed without using bedrails?: A Little Help needed moving from lying on your back to sitting on the side of a flat bed without  using bedrails?: A Little Help needed moving to and from a bed to a chair (including a wheelchair)?: A Little Help needed standing up from a chair using your arms (e.g., wheelchair or bedside chair)?: A Little Help needed to walk in hospital room?: A Little Help needed climbing 3-5 steps with a railing? : A Little 6 Click Score: 18    End of Session Equipment Utilized During Treatment: Gait belt Activity Tolerance: Patient tolerated treatment well Patient left: in chair;with call bell/phone within reach;with chair alarm set;with family/visitor present Nurse Communication: Mobility status PT Visit Diagnosis: Other abnormalities of gait and mobility (R26.89)     Time: [id number] PT Time Calculation (min) (ACUTE ONLY): 18 min  Jannette Spanner PT, DPT Physical Therapist Acute Rehabilitation Services Preferred contact method: Secure Chat Weekend Pager Only: [phone] Office: [phone]  Myrtis Hopping [place] 06/21/2022, 3:36 PM

## 2022-06-21 NOTE — Plan of Care (Signed)
  Problem: Education: Goal: Knowledge of General Education information will improve Description Including pain rating scale, medication(s)/side effects and non-pharmacologic comfort measures Outcome: Progressing   

## 2022-06-21 NOTE — TOC Transition Note (Addendum)
Transition of Care [facility]) - CM/SW Discharge Note   Patient Details  Name: Cynthia Hess MRN: [id number] Date of Birth: [birth year]  [facility] CM/SW Contact:  Roseanne Kaufman, RN Phone Number: 06/21/2022, 2:05 PM   Clinical Narrative:    Per chart review patient treated for osteoarthritis of left hip. PT recommends rolling walker, BSC. Patient needs outpatient ambulatory referral which has been initiated awaiting cosign. This RNCM notified MD for DME orders. This RNCM spoke with Jasmine with [employer] re: DME: RW, BSC, who can deliver DME to bedside once DME orders are in. Awaiting MD cosign.  Transportation: patient's family   TOC will continue to follow.    - 2:50PM This RNCM spoke with PA advise of PT recommendation for RW and patient request for [facility]. This RNCM placed OPPT referral. PA will review.   - 3:20pm RN contacted the RNCM that DME is being delivered.  No additional TOC needs at this time  Final next level of care: [place] (DME: BSC, RW) Barriers to Discharge:  (awaiting DME orders)   Patient Goals and CMS Choice CMS Medicare.gov Compare Post Acute Care list provided to:: Patient Choice offered to / list presented to : Patient  Discharge Placement                         Discharge Plan and Services Additional resources added to the After Visit Summary for                  DME Arranged: Bedside commode, Walker rolling DME Agency: [employer] Date DME Agency Contacted: 06/21/22 Time DME Agency Contacted: [id number] Representative spoke with at DME Agency: [place]: NA [place] Agency: NA        Social Determinants of Health ([place]) Interventions SDOH Screenings   Food Insecurity: No Food Insecurity (06/20/2022)  Housing: Low Risk  (06/20/2022)  Transportation Needs: No Transportation Needs (06/20/2022)  Utilities: Not At Risk (06/20/2022)  Alcohol Screen: Low Risk  (09/25/2020)  Depression (PHQ2-9): Low Risk  (11/16/2020)   Financial Resource Strain: Low Risk  (09/25/2020)  Physical Activity: Inactive (09/25/2020)  Social Connections: Socially Isolated (09/25/2020)  Stress: Stress Concern Present (09/25/2020)  Tobacco Use: High Risk (06/20/2022)     Readmission Risk Interventions     No data to display

## 2022-06-21 NOTE — Progress Notes (Signed)
Subjective: Patient reports pain as mild to moderate.  Tolerating ginger ale and crackers right now.  Urinating.   No CP, SOB.  Has mobilized well OOB with PT.   Objective:   VITALS:   Vitals:   06/20/22 2008 06/21/22 0154 06/21/22 0513 06/21/22 0930  BP: (!) 162/74 136/82 139/73 (!) 145/74  Pulse: 88 65 89 74  Resp: 18 17 16 16   Temp: 97.8 F (36.6 C) 97.6 F (36.4 C) 97.8 F (36.6 C) 98 F (36.7 C)  TempSrc: Oral Oral Oral Oral  SpO2: 99% 96% 90% 99%  Weight:      Height:          Latest Ref Rng & Units 06/21/2022    3:31 AM 06/11/2022   10:12 AM 11/18/2018    3:11 PM  CBC  WBC 4.0 - 10.5 K/uL 12.6  7.9  7.9   Hemoglobin 12.0 - 15.0 g/dL 11.2  12.6  12.9   Hematocrit 36.0 - 46.0 % 33.0  38.3  35.7   Platelets 150 - 400 K/uL 305  329  342       Latest Ref Rng & Units 06/21/2022    3:31 AM 06/11/2022   10:12 AM 10/15/2020   11:25 AM  BMP  Glucose 70 - 99 mg/dL 143  99  104   BUN 8 - 23 mg/dL 15  10  13    Creatinine 0.44 - 1.00 mg/dL 0.63  0.66  0.57   BUN/Creat Ratio 12 - 28   23   Sodium 135 - 145 mmol/L 136  142  141   Potassium 3.5 - 5.1 mmol/L 3.1  3.2  3.6   Chloride 98 - 111 mmol/L 103  108  106   CO2 22 - 32 mmol/L 22  25  22    Calcium 8.9 - 10.3 mg/dL 8.7  8.6  9.3    Intake/Output      03/22 0701 03/23 0700 03/23 0701 03/24 0700   P.O. 720    I.V. (mL/kg) 1900 (23.8)    IV Piggyback 200    Total Intake(mL/kg) 2820 (35.3)    Urine (mL/kg/hr) 1725    Emesis/NG output 0    Blood 350    Total Output 2075    Net +745         Urine Occurrence 1 x 2 x   Emesis Occurrence 2 x 2 x      Physical Exam: General: NAD.  Sitting up in bedside chair, calm, comfortable, not currently nauseous anymore Resp: No increased wob Cardio: regular rate and rhythm ABD soft Neurologically intact MSK Neurovascularly intact Sensation intact distally Intact pulses distally Dorsiflexion/Plantar flexion intact Incision: dressing C/D/I   Assessment: 1 Day  Post-Op  S/P Procedure(s) (LRB): TOTAL HIP ARTHROPLASTY (Left) by Dr. Zachery Dakins on 06/20/22  Principal Problem:   Primary osteoarthritis of left hip Active Problems:   Osteoarthritis of left hip   Plan: Continue to control N/V  Advance diet Up with therapy Incentive Spirometry Elevate and Apply ice  Weightbearing: WBAT LLE Insicional and dressing care: Reinforce dressings as needed Orthopedic device(s): None Showering: Keep dressing dry VTE prophylaxis: Aspirin 81mg  BID  x 30 days , SCDs, ambulation Pain control: limit narcotics as able to help decrease N/V Follow - up plan: 2 weeks   Dispo: Home hopefully later today if can continue to control the N/V episodes.Will wait to hear from her RN on how she is feeling.      Cynthia Hess  [place]ie Pa[place] Office [phone] 06/21/2022, 11:27 AM

## 2022-06-21 NOTE — Discharge Summary (Signed)
Physician Discharge Summary  Patient ID: CALIROSE DUBUISSON MRN: [id number] DOB/AGE: [birth year] 67 y.o.  Admit date: 06/20/2022 Discharge date: 06/21/2022  Admission Diagnoses: left hip OA  Discharge Diagnoses:  Principal Problem:   Primary osteoarthritis of left hip Active Problems:   Osteoarthritis of left hip   Discharged Condition: fair  Hospital Course: Patient underwent a left THA PA by Dr. Zachery Dakins on [id number] without complications. She spent the night in observation for pain control and mobilization. She had some issues with episodes of N/V that seemed to correlate with receiving IV narcotics. She feels better now.    Discharge Exam: Blood pressure (!) 145/74, pulse 74, temperature 98 F (36.7 C), temperature source Oral, resp. rate 16, height 5\' 2"  (1.575 m), weight 79.8 kg, SpO2 99 %. General appearance: alert, cooperative, and no distress Resp: clear to auscultation bilaterally Extremities: extremities normal, atraumatic, no cyanosis or edema Pulses:  L brachial 2+ R brachial 2+  L radial 2+ R radial 2+  L inguinal 2+ R inguinal 2+  L popliteal 2+ R popliteal 2+  L posterior tibial 2+ R posterior tibial 2+  L dorsalis pedis 2+ R dorsalis pedis 2+   Neurologic: Grossly normal Incision/Wound: c/d/i  Disposition: Discharge disposition: 01-Home or Self Care       Discharge Instructions     Call MD / Call 911   Complete by: As directed    If you experience chest pain or shortness of breath, CALL 911 and be transported to the hospital emergency room.  If you develope a fever above 101 F, pus (white drainage) or increased drainage or redness at the wound, or calf pain, call your surgeon's office.   Call MD / Call 911   Complete by: As directed    If you experience chest pain or shortness of breath, CALL 911 and be transported to the hospital emergency room.  If you develope a fever above 101 F, pus (white drainage) or increased drainage or redness at the wound, or  calf pain, call your surgeon's office.   Constipation Prevention   Complete by: As directed    Drink plenty of fluids.  Prune juice may be helpful.  You may use a stool softener, such as Colace (over the counter) 100 mg twice a day.  Use MiraLax (over the counter) for constipation as needed.   DO NOT drive, shower or take a tub bath until instructed by your physician   Complete by: As directed    Diet - low sodium heart healthy   Complete by: As directed    Diet - low sodium heart healthy   Complete by: As directed    Discharge wound care:   Complete by: As directed    If you have a hip bandage, keep it clean and dry.  Change your bandage as instructed by your health care providers.  If your bandage has been discontinued, keep your incision clean and dry.  Pat dry after bathing.  DO NOT put lotion or powder on your incision.   Do not sit on low chairs, stoools or toilet seats, as it may be difficult to get up from low surfaces   Complete by: As directed    Increase activity slowly as tolerated   Complete by: As directed    Post-operative opioid taper instructions:   Complete by: As directed    POST-OPERATIVE OPIOID TAPER INSTRUCTIONS: It is important to wean off of your opioid medication as soon as possible. If you do not  need pain medication after your surgery it is ok to stop day one. Opioids include: Codeine, Hydrocodone(Norco, Vicodin), Oxycodone(Percocet, oxycontin) and hydromorphone amongst others.  Long term and even short term use of opiods can cause: Increased pain response Dependence Constipation Depression Respiratory depression And more.  Withdrawal symptoms can include Flu like symptoms Nausea, vomiting And more Techniques to manage these symptoms Hydrate well Eat regular healthy meals Stay active Use relaxation techniques(deep breathing, meditating, yoga) Do Not substitute Alcohol to help with tapering If you have been on opioids for less than two weeks and do  not have pain than it is ok to stop all together.  Plan to wean off of opioids This plan should start within one week post op of your joint replacement. Maintain the same interval or time between taking each dose and first decrease the dose.  Cut the total daily intake of opioids by one tablet each day Next start to increase the time between doses. The last dose that should be eliminated is the evening dose.      Post-operative opioid taper instructions:   Complete by: As directed    POST-OPERATIVE OPIOID TAPER INSTRUCTIONS: It is important to wean off of your opioid medication as soon as possible. If you do not need pain medication after your surgery it is ok to stop day one. Opioids include: Codeine, Hydrocodone(Norco, Vicodin), Oxycodone(Percocet, oxycontin) and hydromorphone amongst others.  Long term and even short term use of opiods can cause: Increased pain response Dependence Constipation Depression Respiratory depression And more.  Withdrawal symptoms can include Flu like symptoms Nausea, vomiting And more Techniques to manage these symptoms Hydrate well Eat regular healthy meals Stay active Use relaxation techniques(deep breathing, meditating, yoga) Do Not substitute Alcohol to help with tapering If you have been on opioids for less than two weeks and do not have pain than it is ok to stop all together.  Plan to wean off of opioids This plan should start within one week post op of your joint replacement. Maintain the same interval or time between taking each dose and first decrease the dose.  Cut the total daily intake of opioids by one tablet each day Next start to increase the time between doses. The last dose that should be eliminated is the evening dose.      Weight bearing as tolerated   Complete by: As directed       Allergies as of 06/21/2022       Reactions   Latex Itching, Swelling   Other Hives   Black Hair dye, hair loss   Banana Swelling         Medication List     TAKE these medications    acetaminophen 500 MG tablet Commonly known as: TYLENOL Take 2 tablets (1,000 mg total) by mouth every 6 (six) hours as needed for moderate pain. What changed:  how much to take when to take this   amLODipine 5 MG tablet Commonly known as: NORVASC Take 5 mg by mouth daily.   atorvastatin 40 MG tablet Commonly known as: LIPITOR TAKE 1 TABLET(40 MG) BY MOUTH DAILY   celecoxib 100 MG capsule Commonly known as: CeleBREX Take 1 capsule (100 mg total) by mouth 2 (two) times daily for 14 days.   fluticasone 50 MCG/ACT nasal spray Commonly known as: FLONASE SHAKE LIQUID AND USE 2 SPRAYS IN EACH NOSTRIL DAILY   lisinopril 40 MG tablet Commonly known as: ZESTRIL TAKE 1 TABLET(40 MG) BY MOUTH DAILY   methocarbamol  500 MG tablet Commonly known as: ROBAXIN Take 1 tablet (500 mg total) by mouth every 8 (eight) hours as needed for up to 10 days for muscle spasms.   ondansetron 4 MG tablet Commonly known as: Zofran Take 1 tablet (4 mg total) by mouth every 8 (eight) hours as needed for up to 14 days for nausea or vomiting.   oxyCODONE-acetaminophen 5-325 MG tablet Commonly known as: PERCOCET/ROXICET Take 1 tablet by mouth every 4 (four) hours as needed for severe pain.               Discharge Care Instructions  (From admission, onward)           Start     Ordered   06/21/22 0000  Weight bearing as tolerated        06/21/22 1132   06/20/22 0000  Discharge wound care:       Comments: If you have a hip bandage, keep it clean and dry.  Change your bandage as instructed by your health care providers.  If your bandage has been discontinued, keep your incision clean and dry.  Pat dry after bathing.  DO NOT put lotion or powder on your incision.   06/20/22 1021            Follow-up Information     Willaim Sheng, MD Follow up in 2 week(s).   Specialty: Orthopedic Surgery Contact information: [address] [address] [phone]                 Signed: Alisa Graff 06/21/2022, 11:33 AM

## 2022-06-21 NOTE — Progress Notes (Signed)
The patient is alert and oriented and has been seen by her physician. The orders for discharge were written. IV has been removed. Aquacel dressing is clean, dry, and intact. Went over discharge instructions with patient and family. She is being discharged via wheelchair with all of her belongings.  

## 2022-06-23 ENCOUNTER — Other Ambulatory Visit: Payer: Self-pay

## 2022-06-23 ENCOUNTER — Encounter (HOSPITAL_COMMUNITY): Payer: Self-pay | Admitting: Orthopedic Surgery

## 2022-06-23 ENCOUNTER — Ambulatory Visit: Payer: Medicare HMO | Attending: Orthopedic Surgery

## 2022-06-23 DIAGNOSIS — R2689 Other abnormalities of gait and mobility: Secondary | ICD-10-CM | POA: Diagnosis present

## 2022-06-23 DIAGNOSIS — M6281 Muscle weakness (generalized): Secondary | ICD-10-CM

## 2022-06-23 DIAGNOSIS — M25552 Pain in left hip: Secondary | ICD-10-CM | POA: Diagnosis not present

## 2022-06-23 NOTE — Therapy (Signed)
OUTPATIENT PHYSICAL THERAPY LOWER EXTREMITY EVALUATION   Patient Name: Cynthia Hess MRN: [id number] DOB:March 26, 1956, 67 y.o., female Today's Date: 06/23/2022  END OF SESSION:  PT End of Session - 06/23/22 1352     Visit Number 1    Number of Visits 21    Date for PT Re-Evaluation 09/15/22    Authorization Type [employer] MCR    PT Start Time 1355    PT Stop Time 1435    PT Time Calculation (min) 40 min    Activity Tolerance Patient tolerated treatment well    Behavior During Therapy [facility] for tasks assessed/performed             Past Medical History:  Diagnosis Date   Allergic rhinitis 10/03/2016   Arthritis    in low back   Asthma 2009   CLOSED FRACTURE OF METATARSAL BONE 12/29/2008   Annotation: minimally displaced fracture through the fifth metatarsal Qualifier: Diagnosis of  By: Hassell Done FNP, [place]     COSTOCHONDRITIS 09/04/2008   Qualifier: Diagnosis of  By: Hassell Done FNP, [place] Quervain's tenosynovitis, left 10/03/2016   DEGENERATIVE DISC DISEASE, CERVICAL SPINE 10/18/2007   Qualifier: Diagnosis of  By: Hassell Done FNP, [facility]     DENTAL CARIES 02/04/2008   Qualifier: Diagnosis of  By: Hassell Done FNP, [place]     Depression    meds in the past/ denies 06/16/13   HERNIATED LUMBOSACRAL [place] 05/02/2008   Qualifier: Diagnosis of  By: [place], [place]     History of kidney stones    Hyperlipidemia 2013   Hypertension 2009   Left hip pain 11/02/2013   Lumbosacral spondylosis without myelopathy 10/03/2011   Microscopic hematuria 04/22/2007   Qualifier: Diagnosis of  By: Isla Pence     Mood changes 11/18/2018   NEPHROLITHIASIS 05/21/2007   Annotation: bilateral Qualifier: Diagnosis of  By: Hassell Done FNP, [place]     Neuropathy, cervical (radicular) 02/03/2018   Obesity (BMI 30-39.9) 06/03/2013   Osteoarthritis of spine 02/09/2008   Qualifier: Diagnosis of  By: Hassell Done FNP, [place]     PAIN IN SOFT TISSUES OF LIMB 04/15/2007   Qualifier: Diagnosis of  By: Hassell Done  FNP, [place]     Palpitations 11/18/2018   Primary hypertension 09/01/2013   Recurrent incisional hernia with incarceration s/p lap repair w mesh 06/24/2013 05/21/2007   Qualifier: Diagnosis of  By: Hassell Done FNP, [place]     S/P hernia repair 06/24/2013   S/P lumbar spinal fusion 09/29/2014   Sciatica 10/03/2011   SUBACROMIAL BURSITIS, LEFT 10/30/2007   Annotation: per MRI Qualifier: Diagnosis of  By: Hassell Done FNP, [place]     Tobacco use disorder 06/03/2013   TRANSAMINASES, SERUM, ELEVATED 02/19/2010   Qualifier: Diagnosis of  By: Hassell Done FNP, [facility]     TRICHOMONIASIS 04/22/2007   Qualifier: History of  By: Hassell Done FNP, [place]     Vaginal dryness 05/19/2016   WRIST PAIN, BILATERAL 01/09/2010   Qualifier: Diagnosis of  By: Hassell Done FNP, [facility] Milks     Past Surgical History:  Procedure Laterality Date   ABDOMINAL HYSTERECTOMY  04/01/1987   still gets pap smears   BLADDER REPAIR  04/01/1987   ?bladder injury   DILATION AND CURETTAGE OF UTERUS     HERNIA REPAIR  1990s   reports total of 3 surgeries, most recently 2007   INSERTION OF MESH N/A 06/24/2013   Procedure: INSERTION OF MESH;  Surgeon: Adin Hector, MD;  Location: [facility];  Service: General;  Laterality: N/A;  MAXIMUM ACCESS (MAS)POSTERIOR LUMBAR INTERBODY FUSION (PLIF) 2 LEVEL N/A 09/29/2014   Procedure: FOR MAXIMUM ACCESS (MAS) POSTERIOR LUMBAR INTERBODY FUSION  LUMBAR FOUR-FIVE,LUMBAR FIVE-SACRAL ONE;  Surgeon: Eustace Moore, MD;  Location: [place] [facility];  Service: Neurosurgery;  Laterality: N/A;   MULTIPLE TOOTH EXTRACTIONS     SMALL INTESTINE SURGERY  04/01/1987   for "blockage"   TONSILLECTOMY  03/31/1965   TOTAL HIP ARTHROPLASTY Left 06/20/2022   Procedure: TOTAL HIP ARTHROPLASTY;  Surgeon: Willaim Sheng, MD;  Location: [facility];  Service: Orthopedics;  Laterality: Left;   TUBAL LIGATION  [id number]   UMBILICAL HERNIA REPAIR  04/01/2003   open w suture   VENTRAL HERNIA REPAIR  03/31/2005   open w mesh Dr  Rise Patience   VENTRAL HERNIA REPAIR N/A 06/24/2013   Procedure: LAPAROSCOPIC VENTRAL WALL HERNIA REPAIR;  Surgeon: Adin Hector, MD;  Location: [facility];  Service: General;  Laterality: N/A;   Patient Active Problem List   Diagnosis Date Noted   Primary osteoarthritis of left hip 06/20/2022   Osteoarthritis of left hip 06/20/2022   Allergic dermatitis 04/03/2020   Dyspepsia 11/10/2019   Chronic back pain 11/10/2019   Hyperlipidemia 11/10/2019   Palpitations 11/18/2018   Mood changes 11/18/2018   Neuropathy, cervical (radicular) 02/03/2018   Allergic rhinitis 10/03/2016   Vaginal dryness 05/19/2016   S/P lumbar spinal fusion 09/29/2014   Primary hypertension 09/01/2013   S/P hernia repair 06/24/2013   Obesity (BMI 30-39.9) 06/03/2013   Tobacco use disorder 06/03/2013   Lumbosacral spondylosis without myelopathy 10/03/2011   TRANSAMINASES, SERUM, ELEVATED 02/19/2010   Osteoarthritis of spine 02/09/2008   SUBACROMIAL BURSITIS, LEFT 10/30/2007   DEGENERATIVE DISC DISEASE, CERVICAL SPINE 10/18/2007   Recurrent incisional hernia with incarceration s/p lap repair w mesh 06/24/2013 05/21/2007   MICROSCOPIC HEMATURIA 04/22/2007    PCP: Cleta Alberts, MD  REFERRING PROVIDER: Willaim Sheng, MD  REFERRING DIAG: [phone] (ICD-10-CM) - Primary osteoarthritis of left hip   THERAPY DIAG:  Pain in left hip  Muscle weakness (generalized)  Other abnormalities of gait and mobility  Rationale for Evaluation and Treatment: Rehabilitation  ONSET DATE: 06/20/2022  SUBJECTIVE:   SUBJECTIVE STATEMENT: Pt presents to PT s/p L THA posterolateral approach performed by Dr. Zachery Dakins on 06/20/2022. Pt discharged home and has been doing fairly well, does have significant pain in L hip today. Prior to surgery she was an avid walker and would like to get back to doing this pain free. Was not using an AD prior to surgery. Has had a lot of trouble post surgery getting out of bed and  car.  PERTINENT HISTORY: HTN, Depression PAIN:  Are you having pain?  Yes: NPRS scale: 10/10 Worst: 10/10 Pain location: L hip Pain description: sharp, post surgical  Aggravating factors: walking, standing, Relieving factors: rest, medication  PRECAUTIONS: Posterior hip  WEIGHT BEARING RESTRICTIONS: No  FALLS:  Has patient fallen in last 6 months? No  LIVING ENVIRONMENT: Lives with: lives alone Lives in: House/apartment Stairs:  elevator access Has following equipment at home: Single point cane and Environmental consultant - 2 wheeled  OCCUPATION: Not working  PLOF: Independent  PATIENT GOALS: Get back to walking with less pain in hip  OBJECTIVE:   DIAGNOSTIC FINDINGS:   See imaging  PATIENT SURVEYS:  FOTO: 36% function; 69% predicted  COGNITION: Overall cognitive status: Within functional limits for tasks assessed     SENSATION: WFL  EDEMA:  DNT  POSTURE: rounded shoulders and forward head  PALPATION: DNT  LOWER EXTREMITY MMT:  MMT Right eval Left eval  Hip flexion 4/5 DNT  Hip extension    Hip abduction 4/5 DNT  Hip adduction    Hip internal rotation    Hip external rotation    Knee flexion    Knee extension    Ankle dorsiflexion    Ankle plantarflexion    Ankle inversion    Ankle eversion     (Blank rows = not tested)  LOWER EXTREMITY SPECIAL TESTS:  DNT  FUNCTIONAL TESTS:  30 Second Sit to Stand: 2 reps TUG: 45 seconds with FWW  GAIT: Distance walked: 38ft Assistive device utilized: Environmental consultant - 2 wheeled Level of assistance: Modified independence Comments: antalgic gait L   TREATMENT: [facility] Treatment:                                                DATE: 06/23/2022 Therapeutic Exercise: Ankle pumps x 10 Seated glute set x 10 - supine increased pain Review of other HEP  PATIENT EDUCATION:  Education details: eval findings, FOTO, HEP, POC Person educated: Patient Education method: Explanation, Demonstration, and Handouts Education  comprehension: verbalized understanding and returned demonstration  HOME EXERCISE PROGRAM: Access Code: [email or web address] URL: [email or web address] Date: 06/23/2022 Prepared by: Octavio Manns  Exercises - Supine Ankle Pumps  - 8 x daily - 7 x weekly - 2 sets - 20 reps - Supine Gluteal Sets  - 5 x daily - 7 x weekly - 2 sets - 10 reps - 3 sec hold - Supine Hip Abduction  - 5 x daily - 7 x weekly - 2 sets - 10 reps - Standing Hip Abduction with Counter Support  - 5 x daily - 7 x weekly - 10 reps - Standing Hip Extension with Counter Support  - 5 x daily - 7 x weekly - 10 reps  ASSESSMENT:  CLINICAL IMPRESSION: Patient is a 67 y.o. F who was seen today for physical therapy evaluation and treatment s/p L THA posterolateral approach performed on 06/20/2022. Physical findings are consistent with surgery and recover timeline as pt has decrease in left hip strength and functional mobility. Her FOTO score demonstrates decrease in functional ability below PLOF. Pt would benefit from skilled PT services post op and will continue to be seen and progressed as able per POC.   OBJECTIVE IMPAIRMENTS: Abnormal gait, decreased activity tolerance, decreased balance, decreased mobility, difficulty walking, decreased ROM, decreased strength, and pain.   ACTIVITY LIMITATIONS: bending, sitting, standing, squatting, stairs, transfers, and locomotion level  PARTICIPATION LIMITATIONS: meal prep, cleaning, laundry, driving, shopping, community activity, and yard work  PERSONAL FACTORS: Fitness and 1-2 comorbidities: HTN, Depression  are also affecting patient's functional outcome.   REHAB POTENTIAL: Excellent  CLINICAL DECISION MAKING: Evolving/moderate complexity  EVALUATION COMPLEXITY: Moderate   GOALS: Goals reviewed with patient? No  SHORT TERM GOALS: Target date: 07/14/2022   Pt will be compliant and knowledgeable with initial HEP for improved comfort and carryover Baseline: initial HEP given   Goal status: INITIAL  2.  Pt will self report left hip pain no greater than 6/10 for improved comfort and functional ability Baseline: 10/10 at worst Goal status: INITIAL   LONG TERM GOALS: Target date: 09/15/2022   Pt will improve FOTO function score to no less than 69% as proxy for functional improvement Baseline: 36% function Goal status: INITIAL  2.  Pt will self report left hip pain no greater than 3/10 for improved comfort and functional ability Baseline: 10/10 at worst Goal status: INITIAL   3.  Pt will increase 30 Second Sit to Stand rep count to no less than 8 reps for improved balance, strength, and functional mobility Baseline: 2 reps  Goal status: INITIAL   4.  Pt will be able to ambulate 1 mile not limited by pain for return to desired recreational activity post surgery Baseline: unable Goal status: INITIAL   PLAN:  PT FREQUENCY: 2x/week  PT DURATION: 12 weeks  PLANNED INTERVENTIONS: Therapeutic exercises, Therapeutic activity, Neuromuscular re-education, Balance training, Gait training, Patient/Family education, Self Care, and Joint mobilization  PLAN FOR NEXT SESSION: assess HEP response, progress strength and functional mobility post surgery  Referring diagnosis? M16.12 (ICD-10-CM) - Primary osteoarthritis of left hip  Treatment diagnosis? (if different than referring diagnosis)  Pain in left hip Muscle weakness (generalized) Other abnormalities of gait and mobility What was this (referring dx) caused by? [x]  Surgery []  Fall []  Ongoing issue []  Arthritis []  Other: ____________  Laterality: []  Rt [x]  Lt []  Both  Check all possible CPT codes:  *CHOOSE 10 OR LESS*    [x]  97110 (Therapeutic Exercise)  []  92507 (SLP Treatment)  [x]  97112 (Neuro Re-ed)   []  92526 (Swallowing Treatment)   [x]  97116 (Gait Training)   []  [id number] (Cognitive Training, 1st 15 minutes) [x]  97140 (Manual Therapy)   []  97130 (Cognitive Training, each add'l 15 minutes)  [x]   97164 (Re-evaluation)                              []  Other, List CPT Code ____________  [x]  97530 (Therapeutic Activities)     [x]  97535 (Self Care)   []  All codes above (97110 - 97535)  []  97012 (Mechanical Traction)  [x]  97014 (E-stim Unattended)  [x]  97032 (E-stim manual)  []  97033 (Ionto)  []  97035 (Ultrasound) []  97750 (Physical Performance Training) [x]  [id number] (Aquatic Therapy) []  97016 (Vasopneumatic Device) []  [id number] (Paraffin) []  97034 (Contrast Bath) []  97597 (Wound Care 1st 20 sq cm) []  97598 (Wound Care each add'l 20 sq cm) []  97760 (Orthotic Fabrication, Fitting, Training Initial) []  [id number] (Prosthetic Management and Training Initial) []  [id number] (Orthotic or Prosthetic Training/ Modification Subsequent)   Ward Chatters, PT 06/23/2022, 2:44 PM

## 2022-06-26 ENCOUNTER — Ambulatory Visit: Payer: Medicare HMO

## 2022-06-26 DIAGNOSIS — M25552 Pain in left hip: Secondary | ICD-10-CM | POA: Diagnosis not present

## 2022-06-26 DIAGNOSIS — R2689 Other abnormalities of gait and mobility: Secondary | ICD-10-CM

## 2022-06-26 DIAGNOSIS — M6281 Muscle weakness (generalized): Secondary | ICD-10-CM

## 2022-06-26 NOTE — Therapy (Signed)
OUTPATIENT PHYSICAL THERAPY TREATMENT NOTE   Patient Name: Cynthia Hess MRN: [id number] DOB:1955-11-01, 67 y.o., female Today's Date: 06/26/2022  PCP: Cleta Alberts, MD  REFERRING PROVIDER: Willaim Sheng, MD   END OF SESSION:   PT End of Session - 06/26/22 1406     Visit Number 2    Number of Visits 21    Date for PT Re-Evaluation 09/15/22    Authorization Type [employer] MCR    PT Start Time 1406   arrived late   PT Stop Time 1444    PT Time Calculation (min) 38 min    Activity Tolerance Patient tolerated treatment well    Behavior During Therapy [facility] for tasks assessed/performed             Past Medical History:  Diagnosis Date   Allergic rhinitis 10/03/2016   Arthritis    in low back   Asthma 2009   CLOSED FRACTURE OF METATARSAL BONE 12/29/2008   Annotation: minimally displaced fracture through the fifth metatarsal Qualifier: Diagnosis of  By: Hassell Done FNP, [place]     COSTOCHONDRITIS 09/04/2008   Qualifier: Diagnosis of  By: Hassell Done FNP, [place] Quervain's tenosynovitis, left 10/03/2016   DEGENERATIVE DISC DISEASE, CERVICAL SPINE 10/18/2007   Qualifier: Diagnosis of  By: Hassell Done FNP, [place]     DENTAL CARIES 02/04/2008   Qualifier: Diagnosis of  By: Hassell Done FNP, [place]     Depression    meds in the past/ denies 06/16/13   HERNIATED LUMBOSACRAL [place] 05/02/2008   Qualifier: Diagnosis of  By: [place], [place]     History of kidney stones    Hyperlipidemia 2013   Hypertension 2009   Left hip pain 11/02/2013   Lumbosacral spondylosis without myelopathy 10/03/2011   Microscopic hematuria 04/22/2007   Qualifier: Diagnosis of  By: Isla Pence     Mood changes 11/18/2018   NEPHROLITHIASIS 05/21/2007   Annotation: bilateral Qualifier: Diagnosis of  By: Hassell Done FNP, [place]     Neuropathy, cervical (radicular) 02/03/2018   Obesity (BMI 30-39.9) 06/03/2013   Osteoarthritis of spine 02/09/2008   Qualifier: Diagnosis of  By: Hassell Done FNP, [place]      PAIN IN SOFT TISSUES OF LIMB 04/15/2007   Qualifier: Diagnosis of  By: Hassell Done FNP, Nykedtra     Palpitations 11/18/2018   Primary hypertension 09/01/2013   Recurrent incisional hernia with incarceration s/p lap repair w mesh 06/24/2013 05/21/2007   Qualifier: Diagnosis of  By: Hassell Done FNP, [place]ked[place]     S/P hernia repair 06/24/2013   S/P lumbar spinal fusion 09/29/2014   Sciatica 10/03/2011   SUBACROMIAL BURSITIS, LEFT 10/30/2007   Annotation: per MRI Qualifier: Diagnosis of  By: Hassell Done FNP, [place]     Tobacco use disorder 06/03/2013   TRANSAMINASES, SERUM, ELEVATED 02/19/2010   Qualifier: Diagnosis of  By: Hassell Done FNP, Tori Milks     TRICHOMONIASIS 04/22/2007   Qualifier: History of  By: Hassell Done FNP, [place]     Vaginal dryness 05/19/2016   WRIST PAIN, BILATERAL 01/09/2010   Qualifier: Diagnosis of  By: Hassell Done FNP, Tori Milks     Past Surgical History:  Procedure Laterality Date   ABDOMINAL HYSTERECTOMY  04/01/1987   still gets pap smears   BLADDER REPAIR  04/01/1987   ?bladder injury   [place]   reports total of 3 surgeries, most recently 2007   INSERTION OF MESH N/A 06/24/2013   Procedure: INSERTION OF MESH;  Surgeon:  Adin Hector, MD;  Location: [facility];  Service: General;  Laterality: N/A;   MAXIMUM ACCESS (MAS)POSTERIOR LUMBAR INTERBODY FUSION (PLIF) 2 LEVEL N/A 09/29/2014   Procedure: FOR MAXIMUM ACCESS (MAS) POSTERIOR LUMBAR INTERBODY FUSION  LUMBAR FOUR-FIVE,LUMBAR FIVE-SACRAL ONE;  Surgeon: Eustace Moore, MD;  Location: [place] [facility];  Service: Neurosurgery;  Laterality: N/A;   MULTIPLE TOOTH EXTRACTIONS     SMALL INTESTINE SURGERY  04/01/1987   for "blockage"   TONSILLECTOMY  03/31/1965   TOTAL HIP ARTHROPLASTY Left 06/20/2022   Procedure: TOTAL HIP ARTHROPLASTY;  Surgeon: Willaim Sheng, MD;  Location: [facility];  Service: Orthopedics;  Laterality: Left;   TUBAL LIGATION  [id number]   UMBILICAL HERNIA REPAIR   04/01/2003   open w suture   VENTRAL HERNIA REPAIR  03/31/2005   open w mesh Dr Rise Patience   VENTRAL HERNIA REPAIR N/A 06/24/2013   Procedure: LAPAROSCOPIC [place];  Surgeon: Adin Hector, MD;  Location: [facility];  Service: General;  Laterality: N/A;   Patient Active Problem List   Diagnosis Date Noted   Primary osteoarthritis of left hip 06/20/2022   Osteoarthritis of left hip 06/20/2022   Allergic dermatitis 04/03/2020   Dyspepsia 11/10/2019   Chronic back pain 11/10/2019   Hyperlipidemia 11/10/2019   Palpitations 11/18/2018   Mood changes 11/18/2018   Neuropathy, cervical (radicular) 02/03/2018   Allergic rhinitis 10/03/2016   Vaginal dryness 05/19/2016   S/P lumbar spinal fusion 09/29/2014   Primary hypertension 09/01/2013   S/P hernia repair 06/24/2013   Obesity (BMI 30-39.9) 06/03/2013   Tobacco use disorder 06/03/2013   Lumbosacral spondylosis without myelopathy 10/03/2011   TRANSAMINASES, SERUM, ELEVATED 02/19/2010   Osteoarthritis of spine 02/09/2008   SUBACROMIAL BURSITIS, LEFT 10/30/2007   DEGENERATIVE DISC DISEASE, CERVICAL SPINE 10/18/2007   Recurrent incisional hernia with incarceration s/p lap repair w mesh 06/24/2013 05/21/2007   MICROSCOPIC HEMATURIA 04/22/2007    REFERRING DIAG: M16.12 (ICD-10-CM) - Primary osteoarthritis of left hip   THERAPY DIAG:  Pain in left hip  Muscle weakness (generalized)  Other abnormalities of gait and mobility  Rationale for Evaluation and Treatment Rehabilitation  PERTINENT HISTORY: HTN, Depression   PRECAUTIONS: Posterior hip   SUBJECTIVE:                                                                                                                                                                                      SUBJECTIVE STATEMENT:  Pt presents to PT with reports of continued pain in L hip. Has been compliant with initial HEP and seems to be tolerating it better.    PAIN:  Are you having  pain?  Yes: NPRS scale: 10/10 Worst: 10/10 Pain location: L hip Pain description: sharp, post surgical  Aggravating factors: walking, standing, Relieving factors: rest, medication   OBJECTIVE: (objective measures completed at initial evaluation unless otherwise dated)  DIAGNOSTIC FINDINGS:             See imaging   PATIENT SURVEYS:  FOTO: 36% function; 69% predicted   COGNITION: Overall cognitive status: Within functional limits for tasks assessed                         SENSATION: WFL   EDEMA:  DNT   POSTURE: rounded shoulders and forward head   PALPATION: DNT   LOWER EXTREMITY MMT:   MMT Right eval Left eval  Hip flexion 4/5 DNT  Hip extension      Hip abduction 4/5 DNT  Hip adduction      Hip internal rotation      Hip external rotation      Knee flexion      Knee extension      Ankle dorsiflexion      Ankle plantarflexion      Ankle inversion      Ankle eversion       (Blank rows = not tested)   LOWER EXTREMITY SPECIAL TESTS:  DNT   FUNCTIONAL TESTS:  30 Second Sit to Stand: 2 reps TUG: 45 seconds with FWW   GAIT: Distance walked: 73ft Assistive device utilized: Environmental consultant - 2 wheeled Level of assistance: Modified independence Comments: antalgic gait L    TREATMENT: [facility] Adult PT Treatment:                                                DATE: 06/26/2022 Therapeutic Exercise: NuStep lvl 4 UE/LE x 5 min while taking subjective LAQ 2x10 L Seated ball squeeze 2x10 Seated clamshell 3x10 RTB Seated march x 10 Supine glute set x 10  Supine heel slide x 10 L Gait rolled into therex amb step-through pattern x 115ft Standing heel-toe raises x 15 Standing hip abduction L 2x10  [facility] Adult PT Treatment:                                                DATE: 06/23/2022 Therapeutic Exercise: Ankle pumps x 10 Seated glute set x 10 - supine increased pain Review of other HEP   PATIENT EDUCATION:  Education details: continue HEP Person educated:  Patient Education method: Explanation, Demonstration, and Handouts Education comprehension: verbalized understanding and returned demonstration   HOME EXERCISE PROGRAM: Access Code: [email or web address] URL: [email or web address] Date: 06/23/2022 Prepared by: Octavio Manns   Exercises - Supine Ankle Pumps  - 8 x daily - 7 x weekly - 2 sets - 20 reps - Supine Gluteal Sets  - 5 x daily - 7 x weekly - 2 sets - 10 reps - 3 sec hold - Supine Hip Abduction  - 5 x daily - 7 x weekly - 2 sets - 10 reps - Standing Hip Abduction with Counter Support  - 5 x daily - 7 x weekly - 10 reps - Standing Hip Extension with Counter Support  - 5 x daily - 7 x weekly - 10 reps  ASSESSMENT:   CLINICAL IMPRESSION: Pt was able to complete all prescribed exercises with no adverse effect and noted decreased pain post session. Therapy today focused on improving hip strength, gait, and functional mobility post op. She is still unable to perform SLR on L LE but was able to progress to step-through gait pattern today. She continues to benefit from skilled PT services, will continue to progress post op as tolerated.    OBJECTIVE IMPAIRMENTS: Abnormal gait, decreased activity tolerance, decreased balance, decreased mobility, difficulty walking, decreased ROM, decreased strength, and pain.    ACTIVITY LIMITATIONS: bending, sitting, standing, squatting, stairs, transfers, and locomotion level   PARTICIPATION LIMITATIONS: meal prep, cleaning, laundry, driving, shopping, community activity, and yard work   PERSONAL FACTORS: Fitness and 1-2 comorbidities: HTN, Depression  are also affecting patient's functional outcome.     GOALS: Goals reviewed with patient? No   SHORT TERM GOALS: Target date: 07/14/2022   Pt will be compliant and knowledgeable with initial HEP for improved comfort and carryover Baseline: initial HEP given  Goal status: INITIAL   2.  Pt will self report left hip pain no greater than 6/10 for  improved comfort and functional ability Baseline: 10/10 at worst Goal status: INITIAL    LONG TERM GOALS: Target date: 09/15/2022   Pt will improve FOTO function score to no less than 69% as proxy for functional improvement Baseline: 36% function Goal status: INITIAL    2.  Pt will self report left hip pain no greater than 3/10 for improved comfort and functional ability Baseline: 10/10 at worst Goal status: INITIAL    3.  Pt will increase 30 Second Sit to Stand rep count to no less than 8 reps for improved balance, strength, and functional mobility Baseline: 2 reps  Goal status: INITIAL    4.  Pt will be able to ambulate 1 mile not limited by pain for return to desired recreational activity post surgery Baseline: unable Goal status: INITIAL     PLAN:   PT FREQUENCY: 2x/week   PT DURATION: 12 weeks   PLANNED INTERVENTIONS: Therapeutic exercises, Therapeutic activity, Neuromuscular re-education, Balance training, Gait training, Patient/Family education, Self Care, and Joint mobilization   PLAN FOR NEXT SESSION: assess HEP response, progress strength and functional mobility post surgery   Ward Chatters, PT 06/26/2022, 2:52 PM

## 2022-06-30 ENCOUNTER — Other Ambulatory Visit: Payer: Self-pay | Admitting: Student

## 2022-06-30 DIAGNOSIS — I10 Essential (primary) hypertension: Secondary | ICD-10-CM

## 2022-07-01 ENCOUNTER — Ambulatory Visit: Payer: Medicare HMO | Attending: Orthopedic Surgery

## 2022-07-01 DIAGNOSIS — M6281 Muscle weakness (generalized): Secondary | ICD-10-CM | POA: Diagnosis present

## 2022-07-01 DIAGNOSIS — R2689 Other abnormalities of gait and mobility: Secondary | ICD-10-CM | POA: Diagnosis present

## 2022-07-01 DIAGNOSIS — M25552 Pain in left hip: Secondary | ICD-10-CM | POA: Diagnosis present

## 2022-07-01 NOTE — Therapy (Signed)
OUTPATIENT PHYSICAL THERAPY TREATMENT NOTE   Patient Name: Cynthia Hess MRN: [id number] DOB:21-Jun-1955, 67 y.o., female Today's Date: 07/01/2022  PCP: Cleta Alberts, MD  REFERRING PROVIDER: Willaim Sheng, MD   END OF SESSION:   PT End of Session - 07/01/22 1451     Visit Number 3    Number of Visits 21    Date for PT Re-Evaluation 09/15/22    Authorization Type [employer] MCR    Authorization Time Period 12 visits approved 06/26/22-08/09/22    Authorization - Visit Number 2    Authorization - Number of Visits 12    PT Start Time 1451    PT Stop Time 1530    PT Time Calculation (min) 39 min    Activity Tolerance Patient tolerated treatment well    Behavior During Therapy [facility] for tasks assessed/performed              Past Medical History:  Diagnosis Date   Allergic rhinitis 10/03/2016   Arthritis    in low back   Asthma 2009   CLOSED FRACTURE OF METATARSAL BONE 12/29/2008   Annotation: minimally displaced fracture through the fifth metatarsal Qualifier: Diagnosis of  By: Hassell Done FNP, [place]     COSTOCHONDRITIS 09/04/2008   Qualifier: Diagnosis of  By: Hassell Done FNP, [place] Quervain's tenosynovitis, left 10/03/2016   DEGENERATIVE DISC DISEASE, CERVICAL SPINE 10/18/2007   Qualifier: Diagnosis of  By: Hassell Done FNP, [facility]     DENTAL CARIES 02/04/2008   Qualifier: Diagnosis of  By: Hassell Done FNP, Nyked[place]     Depression    meds in the past/ denies 06/16/13   HERNIATED LUMBOSACRAL [place] 05/02/2008   Qualifier: Diagnosis of  By: [place], [place]     History of kidney stones    Hyperlipidemia 2013   Hypertension 2009   Left hip pain 11/02/2013   Lumbosacral spondylosis without myelopathy 10/03/2011   Microscopic hematuria 04/22/2007   Qualifier: Diagnosis of  By: Isla Pence     Mood changes 11/18/2018   NEPHROLITHIASIS 05/21/2007   Annotation: bilateral Qualifier: Diagnosis of  By: Hassell Done FNP, Nykedtra     Neuropathy, cervical (radicular) 02/03/2018    Obesity (BMI 30-39.9) 06/03/2013   Osteoarthritis of spine 02/09/2008   Qualifier: Diagnosis of  By: Hassell Done FNP, Nykedtra     PAIN IN SOFT TISSUES OF LIMB 04/15/2007   Qualifier: Diagnosis of  By: Hassell Done FNP, Nykedtra     Palpitations 11/18/2018   Primary hypertension 09/01/2013   Recurrent incisional hernia with incarceration s/p lap repair w mesh 06/24/2013 05/21/2007   Qualifier: Diagnosis of  By: Hassell Done FNP, Nykedtra     S/P hernia repair 06/24/2013   S/P lumbar spinal fusion 09/29/2014   Sciatica 10/03/2011   SUBACROMIAL BURSITIS, LEFT 10/30/2007   Annotation: per MRI Qualifier: Diagnosis of  By: Hassell Done FNP, Nykedtra     Tobacco use disorder 06/03/2013   TRANSAMINASES, SERUM, ELEVATED 02/19/2010   Qualifier: Diagnosis of  By: Hassell Done FNP, Tori Milks     TRICHOMONIASIS 04/22/2007   Qualifier: History of  By: Hassell Done FNP, Nykedtra     Vaginal dryness 05/19/2016   WRIST PAIN, BILATERAL 01/09/2010   Qualifier: Diagnosis of  By: Hassell Done FNP, Tori Milks     Past Surgical History:  Procedure Laterality Date   ABDOMINAL HYSTERECTOMY  04/01/1987   still gets pap smears   BLADDER REPAIR  04/01/1987   ?bladder injury   [place]  reports total of 3 surgeries, most recently 2007   [place] N/A 06/24/2013   Procedure: INSERTION OF MESH;  Surgeon: Adin Hector, MD;  Location: [facility];  Service: General;  Laterality: N/A;   MAXIMUM ACCESS (MAS)POSTERIOR LUMBAR INTERBODY FUSION (PLIF) 2 LEVEL N/A 09/29/2014   Procedure: FOR MAXIMUM ACCESS (MAS) POSTERIOR LUMBAR INTERBODY FUSION  LUMBAR FOUR-FIVE,LUMBAR FIVE-SACRAL ONE;  Surgeon: Eustace Moore, MD;  Location: [place] [facility];  Service: Neurosurgery;  Laterality: N/A;   MULTIPLE TOOTH EXTRACTIONS     SMALL INTESTINE SURGERY  04/01/1987   for "blockage"   TONSILLECTOMY  03/31/1965   TOTAL HIP ARTHROPLASTY Left 06/20/2022   Procedure: TOTAL HIP ARTHROPLASTY;  Surgeon: Willaim Sheng, MD;   Location[facility];  Service: Orthopedics;  Laterality: Left;   TUBAL LIGATION  [id number]   UMBILICAL HERNIA REPAIR  04/01/2003   open w suture   VENTRAL HERNIA REPAIR  03/31/2005   open w mesh Dr Rise Patience   VENTRAL HERNIA REPAIR N/A 06/24/2013   Procedure: LAPAROSCOPIC [place];  Surgeon: Adin Hector, MD;  Location[facility];  Service: General;  Laterality: N/A;   Patient Active Problem List   Diagnosis Date Noted   Primary osteoarthritis of left hip 06/20/2022   Osteoarthritis of left hip 06/20/2022   Allergic dermatitis 04/03/2020   Dyspepsia 11/10/2019   Chronic back pain 11/10/2019   Hyperlipidemia 11/10/2019   Palpitations 11/18/2018   Mood changes 11/18/2018   Neuropathy, cervical (radicular) 02/03/2018   Allergic rhinitis 10/03/2016   Vaginal dryness 05/19/2016   S/P lumbar spinal fusion 09/29/2014   Primary hypertension 09/01/2013   S/P hernia repair 06/24/2013   Obesity (BMI 30-39.9) 06/03/2013   Tobacco use disorder 06/03/2013   Lumbosacral spondylosis without myelopathy 10/03/2011   TRANSAMINASES, SERUM, ELEVATED 02/19/2010   Osteoarthritis of spine 02/09/2008   SUBACROMIAL BURSITIS, LEFT 10/30/2007   DEGENERATIVE DISC DISEASE, CERVICAL SPINE 10/18/2007   Recurrent incisional hernia with incarceration s/p lap repair w mesh 06/24/2013 05/21/2007   MICROSCOPIC HEMATURIA 04/22/2007    REFERRING DIAG: M16.12 (ICD-10-CM) - Primary osteoarthritis of left hip   THERAPY DIAG:  Pain in left hip  Muscle weakness (generalized)  Other abnormalities of gait and mobility  Rationale for Evaluation and Treatment Rehabilitation  PERTINENT HISTORY: HTN, Depression   PRECAUTIONS: Posterior hip   SUBJECTIVE:                                                                                                                                                                                      SUBJECTIVE STATEMENT:  Pt presents to PT with reports of continued  pain in L  hip, though lessened today. Has been compliant with initial HEP.   PAIN:  Are you having pain?  Yes: NPRS scale: 7/10 Worst: 10/10 Pain location: L hip Pain description: sharp, post surgical  Aggravating factors: walking, standing, Relieving factors: rest, medication   OBJECTIVE: (objective measures completed at initial evaluation unless otherwise dated)  DIAGNOSTIC FINDINGS:             See imaging   PATIENT SURVEYS:  FOTO: 36% function; 69% predicted   COGNITION: Overall cognitive status: Within functional limits for tasks assessed                         SENSATION: WFL   EDEMA:  DNT   POSTURE: rounded shoulders and forward head   PALPATION: DNT   LOWER EXTREMITY MMT:   MMT Right eval Left eval  Hip flexion 4/5 DNT  Hip extension      Hip abduction 4/5 DNT  Hip adduction      Hip internal rotation      Hip external rotation      Knee flexion      Knee extension      Ankle dorsiflexion      Ankle plantarflexion      Ankle inversion      Ankle eversion       (Blank rows = not tested)   LOWER EXTREMITY SPECIAL TESTS:  DNT   FUNCTIONAL TESTS:  30 Second Sit to Stand: 2 reps TUG: 45 seconds with FWW   GAIT: Distance walked: 3ft Assistive device utilized: Environmental consultant - 2 wheeled Level of assistance: Modified independence Comments: antalgic gait L    TREATMENT: [facility] Adult PT Treatment:                                                DATE: 07/01/22 Therapeutic Exercise: NuStep lvl 4 UE/LE x 5 min while taking subjective Supine marching 2x30" LAQ 2x10 L Seated ball squeeze 5" hold x10 (pain in anterior hip) Seated clamshell 3x10 RTB Seated hamstring curl RTB x10 Lt Gait rolled into therex amb step-through pattern x 118ft STS x10 - cues to WS onto LLE Standing heel-toe raises x 15 Standing hip abduction L 2x10 Step ups 4" Lt leading 2x10 fwd/lat   [facility] Adult PT Treatment:                                                DATE:  06/26/2022 Therapeutic Exercise: NuStep lvl 4 UE/LE x 5 min while taking subjective LAQ 2x10 L Seated ball squeeze 2x10 Seated clamshell 3x10 RTB Seated march x 10 Supine glute set x 10  Supine heel slide x 10 L Gait rolled into therex amb step-through pattern x 159ft Standing heel-toe raises x 15 Standing hip abduction L 2x10  [facility] Adult PT Treatment:                                                DATE: 06/23/2022 Therapeutic Exercise: Ankle pumps x 10 Seated glute set x 10 - supine increased pain Review of other  HEP   PATIENT EDUCATION:  Education details: continue HEP Person educated: Patient Education method: Explanation, Demonstration, and Handouts Education comprehension: verbalized understanding and returned demonstration   HOME EXERCISE PROGRAM: Access Code: [email or web address] URL: [email or web address] Date: 06/23/2022 Prepared by: Octavio Manns   Exercises - Supine Ankle Pumps  - 8 x daily - 7 x weekly - 2 sets - 20 reps - Supine Gluteal Sets  - 5 x daily - 7 x weekly - 2 sets - 10 reps - 3 sec hold - Supine Hip Abduction  - 5 x daily - 7 x weekly - 2 sets - 10 reps - Standing Hip Abduction with Counter Support  - 5 x daily - 7 x weekly - 10 reps - Standing Hip Extension with Counter Support  - 5 x daily - 7 x weekly - 10 reps   ASSESSMENT:   CLINICAL IMPRESSION: Patient presents to PT with continued Lt hip pain, though lessened today, and reports HEP compliance. Session today continued to focus on LE strengthening and functional mobility. Introduced step ups this session to good effect, no increase in pain. She still exhibits quad and hip flexor weakness with inability to perform SLR. Patient was able to tolerate all prescribed exercises with no adverse effects. Patient continues to benefit from skilled PT services and should be progressed as able to improve functional independence.     OBJECTIVE IMPAIRMENTS: Abnormal gait, decreased activity tolerance,  decreased balance, decreased mobility, difficulty walking, decreased ROM, decreased strength, and pain.    ACTIVITY LIMITATIONS: bending, sitting, standing, squatting, stairs, transfers, and locomotion level   PARTICIPATION LIMITATIONS: meal prep, cleaning, laundry, driving, shopping, community activity, and yard work   PERSONAL FACTORS: Fitness and 1-2 comorbidities: HTN, Depression  are also affecting patient's functional outcome.     GOALS: Goals reviewed with patient? No   SHORT TERM GOALS: Target date: 07/14/2022   Pt will be compliant and knowledgeable with initial HEP for improved comfort and carryover Baseline: initial HEP given  Goal status: MET Pt reports adherence 07/01/22   2.  Pt will self report left hip pain no greater than 6/10 for improved comfort and functional ability Baseline: 10/10 at worst Goal status: INITIAL    LONG TERM GOALS: Target date: 09/15/2022   Pt will improve FOTO function score to no less than 69% as proxy for functional improvement Baseline: 36% function Goal status: INITIAL    2.  Pt will self report left hip pain no greater than 3/10 for improved comfort and functional ability Baseline: 10/10 at worst Goal status: INITIAL    3.  Pt will increase 30 Second Sit to Stand rep count to no less than 8 reps for improved balance, strength, and functional mobility Baseline: 2 reps  Goal status: INITIAL    4.  Pt will be able to ambulate 1 mile not limited by pain for return to desired recreational activity post surgery Baseline: unable Goal status: INITIAL     PLAN:   PT FREQUENCY: 2x/week   PT DURATION: 12 weeks   PLANNED INTERVENTIONS: Therapeutic exercises, Therapeutic activity, Neuromuscular re-education, Balance training, Gait training, Patient/Family education, Self Care, and Joint mobilization   PLAN FOR NEXT SESSION: assess HEP response, progress strength and functional mobility post surgery   Margarette Canada, PTA 07/01/2022, 3:28  PM

## 2022-07-03 ENCOUNTER — Ambulatory Visit: Payer: Medicare HMO

## 2022-07-08 ENCOUNTER — Ambulatory Visit: Payer: Medicare HMO

## 2022-07-08 DIAGNOSIS — M6281 Muscle weakness (generalized): Secondary | ICD-10-CM

## 2022-07-08 DIAGNOSIS — R2689 Other abnormalities of gait and mobility: Secondary | ICD-10-CM

## 2022-07-08 DIAGNOSIS — M25552 Pain in left hip: Secondary | ICD-10-CM | POA: Diagnosis not present

## 2022-07-08 NOTE — Therapy (Signed)
OUTPATIENT PHYSICAL THERAPY TREATMENT NOTE   Patient Name: Cynthia Hess MRN: [id number] DOB:1955/05/11, 66 y.o., female Today's Date: 07/08/2022  PCP: Elayne Guerin, MD  REFERRING PROVIDER: Joen Laura, MD   END OF SESSION:   PT End of Session - 07/08/22 1359     Visit Number 4    Number of Visits 21    Date for PT Re-Evaluation 09/15/22    Authorization Type [employer] M[employer]    Authorization Time Period 12 visits approved 06/26/22-08/09/22    Authorization - Number of Visits 12    PT Start Time 1400    PT Stop Time 1440    PT Time Calculation (min) 40 min    Activity Tolerance Patient tolerated treatment well    Behavior During Therapy [facility] for tasks assessed/performed               Past Medical History:  Diagnosis Date   Allergic rhinitis 10/03/2016   Arthritis    in low back   Asthma 2009   CLOSED FRACTURE OF METATARSAL BONE 12/29/2008   Annotation: minimally displaced fracture through the fifth metatarsal Qualifier: Diagnosis of  By: Daphine Deutscher FNP, [employer]     COSTOCHONDRITIS 09/04/2008   Qualifier: Diagnosis of  By: Daphine Deutscher FNP, Fernande Boyden Quervain's tenosynovitis, left 10/03/2016   DEGENERATIVE DISC DISEASE, CERVICAL SPINE 10/18/2007   Qualifier: Diagnosis of  By: Daphine Deutscher FNP, [facility]     DENTAL CARIES 02/04/2008   Qualifier: Diagnosis of  By: Daphine Deutscher FNP, [employer]     Depression    meds in the past/ denies 06/16/13   HERNIATED LUMBOSACRAL DISC 05/02/2008   Qualifier: Diagnosis of  By: [place], [place]     History of kidney stones    Hyperlipidemia 2013   Hypertension 2009   Left hip pain 11/02/2013   Lumbosacral spondylosis without myelopathy 10/03/2011   Microscopic hematuria 04/22/2007   Qualifier: Diagnosis of  By: [name]     Mood changes 11/18/2018   NEPHROLITHIASIS 05/21/2007   Annotation: bilateral Qualifier: Diagnosis of  By: Daphine Deutscher FNP, [employer]     Neuropathy, cervical (radicular) 02/03/2018   Obesity (BMI 30-39.9) 06/03/2013    Osteoarthritis of spine 02/09/2008   Qualifier: Diagnosis of  By: Daphine Deutscher FNP, [employer]     PAIN IN SOFT TISSUES OF LIMB 04/15/2007   Qualifier: Diagnosis of  By: Daphine Deutscher FNP, [employer]     Palpitations 11/18/2018   Primary hypertension 09/01/2013   Recurrent incisional hernia with incarceration s/p lap repair w mesh 06/24/2013 05/21/2007   Qualifier: Diagnosis of  By: Daphine Deutscher FNP, [employer]     S/P hernia repair 06/24/2013   S/P lumbar spinal fusion 09/29/2014   Sciatica 10/03/2011   SUBACROMIAL BURSITIS, LEFT 10/30/2007   Annotation: per MRI Qualifier: Diagnosis of  By: Daphine Deutscher FNP, [employer]     Tobacco use disorder 06/03/2013   TRANSAMINASES, SERUM, ELEVATED 02/19/2010   Qualifier: Diagnosis of  By: Daphine Deutscher FNP, Zena Amos     TRICHOMONIASIS 04/22/2007   Qualifier: History of  By: Daphine Deutscher FNP, [employer]     Vaginal dryness 05/19/2016   WRIST PAIN, BILATERAL 01/09/2010   Qualifier: Diagnosis of  By: Daphine Deutscher FNP, Zena Amos     Past Surgical History:  Procedure Laterality Date   ABDOMINAL HYSTERECTOMY  04/01/1987   still gets pap smears   BLADDER REPAIR  04/01/1987   ?bladder injury   DILATION AND CURETTAGE OF UTERUS     HERNIA REPAIR  1990s   reports total of 3 surgeries,  most recently 2007   INSERTION OF MESH N/A 06/24/2013   Procedure: INSERTION OF MESH;  Surgeon: Ardeth Sportsman, MD;  Location: [facility];  Service: General;  Laterality: N/A;   MAXIMUM ACCESS (MAS)POSTERIOR LUMBAR INTERBODY FUSION (PLIF) 2 LEVEL N/A 09/29/2014   Procedure: FOR MAXIMUM ACCESS (MAS) POSTERIOR LUMBAR INTERBODY FUSION  LUMBAR FOUR-FIVE,LUMBAR FIVE-SACRAL ONE;  Surgeon: Tia Alert, MD;  Location: [facility];  Service: Neurosurgery;  Laterality: N/A;   MULTIPLE TOOTH EXTRACTIONS     SMALL INTESTINE SURGERY  04/01/1987   for "blockage"   TONSILLECTOMY  03/31/1965   TOTAL HIP ARTHROPLASTY Left 06/20/2022   Procedure: TOTAL HIP ARTHROPLASTY;  Surgeon: Joen Laura, MD;  Location: [facility];  Service:  Orthopedics;  Laterality: Left;   TUBAL LIGATION  03/31/1977   UMBILICAL HERNIA REPAIR  04/01/2003   open w suture   VENTRAL HERNIA REPAIR  03/31/2005   open w mesh Dr Zachery Dakins   VENTRAL HERNIA REPAIR N/A 06/24/2013   Procedure: LAPAROSCOPIC VENTRAL WALL HERNIA REPAIR;  Surgeon: Ardeth Sportsman, MD;  Location: [facility];  Service: General;  Laterality: N/A;   Patient Active Problem List   Diagnosis Date Noted   Primary osteoarthritis of left hip 06/20/2022   Osteoarthritis of left hip 06/20/2022   Allergic dermatitis 04/03/2020   Dyspepsia 11/10/2019   Chronic back pain 11/10/2019   Hyperlipidemia 11/10/2019   Palpitations 11/18/2018   Mood changes 11/18/2018   Neuropathy, cervical (radicular) 02/03/2018   Allergic rhinitis 10/03/2016   Vaginal dryness 05/19/2016   S/P lumbar spinal fusion 09/29/2014   Primary hypertension 09/01/2013   S/P hernia repair 06/24/2013   Obesity (BMI 30-39.9) 06/03/2013   Tobacco use disorder 06/03/2013   Lumbosacral spondylosis without myelopathy 10/03/2011   TRANSAMINASES, SERUM, ELEVATED 02/19/2010   Osteoarthritis of spine 02/09/2008   SUBACROMIAL BURSITIS, LEFT 10/30/2007   DEGENERATIVE DISC DISEASE, CERVICAL SPINE 10/18/2007   Recurrent incisional hernia with incarceration s/p lap repair w mesh 06/24/2013 05/21/2007   MICROSCOPIC HEMATURIA 04/22/2007    REFERRING DIAG: M16.12 (ICD-10-CM) - Primary osteoarthritis of left hip   THERAPY DIAG:  Pain in left hip  Muscle weakness (generalized)  Other abnormalities of gait and mobility  Rationale for Evaluation and Treatment Rehabilitation  PERTINENT HISTORY: HTN, Depression   PRECAUTIONS: Posterior hip   SUBJECTIVE:                                                                                                                                                                                      SUBJECTIVE STATEMENT:  Pt presents to PT with reports of no current L hip pain. Had bandage  removed last  week. Has been compliant with HEP with no adverse effect.    PAIN:  Are you having pain?  Yes: NPRS scale: 7/10 Worst: 10/10 Pain location: L hip Pain description: sharp, post surgical  Aggravating factors: walking, standing, Relieving factors: rest, medication   OBJECTIVE: (objective measures completed at initial evaluation unless otherwise dated)  DIAGNOSTIC FINDINGS:             See imaging   PATIENT SURVEYS:  FOTO: 36% function; 69% predicted   COGNITION: Overall cognitive status: Within functional limits for tasks assessed                         SENSATION: WFL   EDEMA:  DNT   POSTURE: rounded shoulders and forward head   PALPATION: DNT   LOWER EXTREMITY MMT:   MMT Right eval Left eval  Hip flexion 4/5 DNT  Hip extension      Hip abduction 4/5 DNT  Hip adduction      Hip internal rotation      Hip external rotation      Knee flexion      Knee extension      Ankle dorsiflexion      Ankle plantarflexion      Ankle inversion      Ankle eversion       (Blank rows = not tested)   LOWER EXTREMITY SPECIAL TESTS:  DNT   FUNCTIONAL TESTS:  30 Second Sit to Stand: 2 reps TUG: 45 seconds with FWW   GAIT: Distance walked: 9750ft Assistive device utilized: Environmental consultantWalker - 2 wheeled Level of assistance: Modified independence Comments: antalgic gait L    TREATMENT: [facility] Adult PT Treatment:                                                DATE: 07/08/22 Therapeutic Exercise: NuStep lvl 4 UE/LE x 5 min while taking subjective Supine quad set x 10 L - 5"hold PT assisted SLR x 5 - L  Bridge 2x10 Supine clamshell 2x15 GTB Supine march x 20 GTB STS 2x10 - no UE Lateral walk at counter YTB x 3 laps  Standing hip abd/ext 2x10 YTB Mini squat with B UE support 2x10 Gait training: Amb with SPC in R hand x 16985ft with CGA; cues   [facility] Adult PT Treatment:                                                DATE: 07/01/22 Therapeutic Exercise: NuStep lvl 4 UE/LE x  5 min while taking subjective Supine marching 2x30" LAQ 2x10 L Seated ball squeeze 5" hold x10 (pain in anterior hip) Seated clamshell 3x10 RTB Seated hamstring curl RTB x10 Lt Gait rolled into therex amb step-through pattern x 16885ft STS x10 - cues to WS onto LLE Standing heel-toe raises x 15 Standing hip abduction L 2x10 Step ups 4" Lt leading 2x10 fwd/lat   [facility] Adult PT Treatment:  DATE: 06/26/2022 Therapeutic Exercise: NuStep lvl 4 UE/LE x 5 min while taking subjective LAQ 2x10 L Seated ball squeeze 2x10 Seated clamshell 3x10 RTB Seated march x 10 Supine glute set x 10  Supine heel slide x 10 L Gait rolled into therex amb step-through pattern x 122ft Standing heel-toe raises x 15 Standing hip abduction L 2x10  [facility] Adult PT Treatment:                                                DATE: 06/23/2022 Therapeutic Exercise: Ankle pumps x 10 Seated glute set x 10 - supine increased pain Review of other HEP   PATIENT EDUCATION:  Education details: continue HEP Person educated: Patient Education method: Explanation, Demonstration, and Handouts Education comprehension: verbalized understanding and returned demonstration   HOME EXERCISE PROGRAM: Access Code: [email or web address] URL: [email or web address] Date: 06/23/2022 Prepared by: Edwinna Areola   Exercises - Supine Ankle Pumps  - 8 x daily - 7 x weekly - 2 sets - 20 reps - Supine Gluteal Sets  - 5 x daily - 7 x weekly - 2 sets - 10 reps - 3 sec hold - Supine Hip Abduction  - 5 x daily - 7 x weekly - 2 sets - 10 reps - Standing Hip Abduction with Counter Support  - 5 x daily - 7 x weekly - 10 reps - Standing Hip Extension with Counter Support  - 5 x daily - 7 x weekly - 10 reps   ASSESSMENT:   CLINICAL IMPRESSION: Pt was able to complete all prescribed exercises with no adverse effect.Therapy today focused on improving hip strength, gait, and functional mobility post op.  She was able to progress today to using SPC with step through pattern but is still unable to perform SLR on L LE. HEP updated for continued progression. She continues to benefit from skilled PT services, will continue to progress post op as tolerated.     OBJECTIVE IMPAIRMENTS: Abnormal gait, decreased activity tolerance, decreased balance, decreased mobility, difficulty walking, decreased ROM, decreased strength, and pain.    ACTIVITY LIMITATIONS: bending, sitting, standing, squatting, stairs, transfers, and locomotion level   PARTICIPATION LIMITATIONS: meal prep, cleaning, laundry, driving, shopping, community activity, and yard work   PERSONAL FACTORS: Fitness and 1-2 comorbidities: HTN, Depression  are also affecting patient's functional outcome.     GOALS: Goals reviewed with patient? No   SHORT TERM GOALS: Target date: 07/14/2022   Pt will be compliant and knowledgeable with initial HEP for improved comfort and carryover Baseline: initial HEP given  Goal status: MET Pt reports adherence 07/01/22   2.  Pt will self report left hip pain no greater than 6/10 for improved comfort and functional ability Baseline: 10/10 at worst Goal status: INITIAL    LONG TERM GOALS: Target date: 09/15/2022   Pt will improve FOTO function score to no less than 69% as proxy for functional improvement Baseline: 36% function Goal status: INITIAL    2.  Pt will self report left hip pain no greater than 3/10 for improved comfort and functional ability Baseline: 10/10 at worst Goal status: INITIAL    3.  Pt will increase 30 Second Sit to Stand rep count to no less than 8 reps for improved balance, strength, and functional mobility Baseline: 2 reps  Goal status: INITIAL    4.  Pt will be  able to ambulate 1 mile not limited by pain for return to desired recreational activity post surgery Baseline: unable Goal status: INITIAL     PLAN:   PT FREQUENCY: 2x/week   PT DURATION: 12 weeks   PLANNED  INTERVENTIONS: Therapeutic exercises, Therapeutic activity, Neuromuscular re-education, Balance training, Gait training, Patient/Family education, Self Care, and Joint mobilization   PLAN FOR NEXT SESSION: assess HEP response, progress strength and functional mobility post surgery   Eloy End, PT 07/08/2022, 2:43 PM

## 2022-07-10 ENCOUNTER — Ambulatory Visit: Payer: Medicare HMO

## 2022-07-10 DIAGNOSIS — R2689 Other abnormalities of gait and mobility: Secondary | ICD-10-CM

## 2022-07-10 DIAGNOSIS — M25552 Pain in left hip: Secondary | ICD-10-CM | POA: Diagnosis not present

## 2022-07-10 DIAGNOSIS — M6281 Muscle weakness (generalized): Secondary | ICD-10-CM

## 2022-07-10 NOTE — Therapy (Signed)
OUTPATIENT PHYSICAL THERAPY TREATMENT NOTE   Patient Name: Cynthia Hess MRN: [id number] DOB:09-Dec-1955, 67 y.o., female Today's Date: 07/10/2022  PCP: Elayne Guerin, MD  REFERRING PROVIDER: Joen Laura, MD   END OF SESSION:   PT End of Session - 07/10/22 1528     Visit Number 5    Number of Visits 21    Date for PT Re-Evaluation 09/15/22    Authorization Type [facility]    Authorization Time Period 12 visits approved 06/26/22-08/09/22    Authorization - Number of Visits 12    PT Start Time 1530    PT Stop Time 1610    PT Time Calculation (min) 40 min    Activity Tolerance Patient tolerated treatment well    Behavior During Therapy [facility] for tasks assessed/performed                Past Medical History:  Diagnosis Date   Allergic rhinitis 10/03/2016   Arthritis    in low back   Asthma 2009   CLOSED FRACTURE OF METATARSAL BONE 12/29/2008   Annotation: minimally displaced fracture through the fifth metatarsal Qualifier: Diagnosis of  By: Daphine Deutscher FNP, [place]     COSTOCHONDRITIS 09/04/2008   Qualifier: Diagnosis of  By: Daphine Deutscher FNP, Fernande Boyden Quervain's tenosynovitis, left 10/03/2016   DEGENERATIVE DISC DISEASE, CERVICAL SPINE 10/18/2007   Qualifier: Diagnosis of  By: Daphine Deutscher FNP, [facility]     DENTAL CARIES 02/04/2008   Qualifier: Diagnosis of  By: Daphine Deutscher FNP, [place]     Depression    meds in the past/ denies 06/16/13   HERNIATED LUMBOSACRAL DISC 05/02/2008   Qualifier: Diagnosis of  By: Shannon, [place]     History of kidney stones    Hyperlipidemia 2013   Hypertension 2009   Left hip pain 11/02/2013   Lumbosacral spondylosis without myelopathy 10/03/2011   Microscopic hematuria 04/22/2007   Qualifier: Diagnosis of  By: [name]     Mood changes 11/18/2018   NEPHROLITHIASIS 05/21/2007   Annotation: bilateral Qualifier: Diagnosis of  By: Daphine Deutscher FNP, [employer]     Neuropathy, cervical (radicular) 02/03/2018   Obesity (BMI 30-39.9) 06/03/2013    Osteoarthritis of spine 02/09/2008   Qualifier: Diagnosis of  By: Daphine Deutscher FNP, [employer]     PAIN IN SOFT TISSUES OF LIMB 04/15/2007   Qualifier: Diagnosis of  By: Daphine Deutscher FNP, [employer]     Palpitations 11/18/2018   Primary hypertension 09/01/2013   Recurrent incisional hernia with incarceration s/p lap repair w mesh 06/24/2013 05/21/2007   Qualifier: Diagnosis of  By: Daphine Deutscher FNP, [place]     S/P hernia repair 06/24/2013   S/P lumbar spinal fusion 09/29/2014   Sciatica 10/03/2011   SUBACROMIAL BURSITIS, LEFT 10/30/2007   Annotation: per MRI Qualifier: Diagnosis of  By: Daphine Deutscher FNP, [place]     Tobacco use disorder 06/03/2013   TRANSAMINASES, SERUM, ELEVATED 02/19/2010   Qualifier: Diagnosis of  By: Daphine Deutscher FNP, Zena Amos     TRICHOMONIASIS 04/22/2007   Qualifier: History of  By: Daphine Deutscher FNP, [place]     Vaginal dryness 05/19/2016   WRIST PAIN, BILATERAL 01/09/2010   Qualifier: Diagnosis of  By: Daphine Deutscher FNP, Zena Amos     Past Surgical History:  Procedure Laterality Date   ABDOMINAL HYSTERECTOMY  04/01/1987   still gets pap smears   BLADDER REPAIR  04/01/1987   ?bladder injury   DILATION AND CURETTAGE OF UTERUS     HERNIA REPAIR  1990s   reports total of 3  surgeries, most recently 2007   INSERTION OF MESH N/A 06/24/2013   Procedure: INSERTION OF MESH;  Surgeon: Ardeth Sportsman, MD;  Location: [facility];  Service: General;  Laterality: N/A;   MAXIMUM ACCESS (MAS)POSTERIOR LUMBAR INTERBODY FUSION (PLIF) 2 LEVEL N/A 09/29/2014   Procedure: FOR MAXIMUM ACCESS (MAS) POSTERIOR LUMBAR INTERBODY FUSION  LUMBAR FOUR-FIVE,LUMBAR FIVE-SACRAL ONE;  Surgeon: Tia Alert, MD;  Location: [facility];  Service: Neurosurgery;  Laterality: N/A;   MULTIPLE TOOTH EXTRACTIONS     SMALL INTESTINE SURGERY  04/01/1987   for "blockage"   TONSILLECTOMY  03/31/1965   TOTAL HIP ARTHROPLASTY Left 06/20/2022   Procedure: TOTAL HIP ARTHROPLASTY;  Surgeon: Joen Laura, MD;  Location: [facility];  Service:  Orthopedics;  Laterality: Left;   TUBAL LIGATION  03/31/1977   UMBILICAL HERNIA REPAIR  04/01/2003   open w suture   VENTRAL HERNIA REPAIR  03/31/2005   open w mesh Dr Zachery Dakins   VENTRAL HERNIA REPAIR N/A 06/24/2013   Procedure: LAPAROSCOPIC VENTRAL WALL HERNIA REPAIR;  Surgeon: Ardeth Sportsman, MD;  Location: [facility];  Service: General;  Laterality: N/A;   Patient Active Problem List   Diagnosis Date Noted   Primary osteoarthritis of left hip 06/20/2022   Osteoarthritis of left hip 06/20/2022   Allergic dermatitis 04/03/2020   Dyspepsia 11/10/2019   Chronic back pain 11/10/2019   Hyperlipidemia 11/10/2019   Palpitations 11/18/2018   Mood changes 11/18/2018   Neuropathy, cervical (radicular) 02/03/2018   Allergic rhinitis 10/03/2016   Vaginal dryness 05/19/2016   S/P lumbar spinal fusion 09/29/2014   Primary hypertension 09/01/2013   S/P hernia repair 06/24/2013   Obesity (BMI 30-39.9) 06/03/2013   Tobacco use disorder 06/03/2013   Lumbosacral spondylosis without myelopathy 10/03/2011   TRANSAMINASES, SERUM, ELEVATED 02/19/2010   Osteoarthritis of spine 02/09/2008   SUBACROMIAL BURSITIS, LEFT 10/30/2007   DEGENERATIVE DISC DISEASE, CERVICAL SPINE 10/18/2007   Recurrent incisional hernia with incarceration s/p lap repair w mesh 06/24/2013 05/21/2007   MICROSCOPIC HEMATURIA 04/22/2007    REFERRING DIAG: M16.12 (ICD-10-CM) - Primary osteoarthritis of left hip   THERAPY DIAG:  Pain in left hip  Muscle weakness (generalized)  Other abnormalities of gait and mobility  Rationale for Evaluation and Treatment Rehabilitation  PERTINENT HISTORY: HTN, Depression   PRECAUTIONS: Posterior hip   SUBJECTIVE:                                                                                                                                                                                      SUBJECTIVE STATEMENT:  Pt presents to PT with reports of continued pain in L hip. Pt has a quad  cane she was given for free and would liek to trial it today. Has been compliant with no adverse effect.    PAIN:  Are you having pain?  Yes: NPRS scale: 3/10 Worst: 10/10 Pain location: L hip Pain description: sharp, post surgical  Aggravating factors: walking, standing, Relieving factors: rest, medication   OBJECTIVE: (objective measures completed at initial evaluation unless otherwise dated)  DIAGNOSTIC FINDINGS:             See imaging   PATIENT SURVEYS:  FOTO: 36% function; 69% predicted   COGNITION: Overall cognitive status: Within functional limits for tasks assessed                         SENSATION: WFL   EDEMA:  DNT   POSTURE: rounded shoulders and forward head   PALPATION: DNT   LOWER EXTREMITY MMT:   MMT Right eval Left eval  Hip flexion 4/5 DNT  Hip extension      Hip abduction 4/5 DNT  Hip adduction      Hip internal rotation      Hip external rotation      Knee flexion      Knee extension      Ankle dorsiflexion      Ankle plantarflexion      Ankle inversion      Ankle eversion       (Blank rows = not tested)   LOWER EXTREMITY SPECIAL TESTS:  DNT   FUNCTIONAL TESTS:  30 Second Sit to Stand: 2 reps TUG: 45 seconds with FWW   GAIT: Distance walked: 65ft Assistive device utilized: Environmental consultant - 2 wheeled Level of assistance: Modified independence Comments: antalgic gait L    TREATMENT: [facility] Adult PT Treatment:                                                DATE: 07/10/22 Therapeutic Exercise: NuStep lvl 5 UE/LE x 5 min while taking subjective STS 2x10 - no UE support Supine quad set x 10 - 5" hold Bridge 2x10 PT assisted L SLR eccentric lowering x 5 Supine clamshell 2x15 BTB Standing hip abd/ext 2x10 RTB Lateral walk at counter RTB x 3 laps  Standing hip abd/ext 2x10 YTB LAQ 3x10 2.5# Gait training: Amb with quad cane in R hand x 155ft with CGA; cues   [facility] Adult PT Treatment:                                                DATE:  07/08/22 Therapeutic Exercise: NuStep lvl 4 UE/LE x 5 min while taking subjective Supine quad set x 10 L - 5"hold PT assisted SLR x 5 - L  Bridge 2x10 Supine clamshell 2x15 GTB Supine march x 20 GTB STS 2x10 - no UE Lateral walk at counter YTB x 3 laps  Standing hip abd/ext 2x10 YTB Mini squat with B UE support 2x10 Gait training: Amb with SPC in R hand x 165ft with CGA; cues   [facility] Adult PT Treatment:  DATE: 07/01/22 Therapeutic Exercise: NuStep lvl 4 UE/LE x 5 min while taking subjective Supine marching 2x30" LAQ 2x10 L Seated ball squeeze 5" hold x10 (pain in anterior hip) Seated clamshell 3x10 RTB Seated hamstring curl RTB x10 Lt Gait rolled into therex amb step-through pattern x 12385ft STS x10 - cues to WS onto LLE Standing heel-toe raises x 15 Standing hip abduction L 2x10 Step ups 4" Lt leading 2x10 fwd/lat   [facility] Adult PT Treatment:                                                DATE: 06/26/2022 Therapeutic Exercise: NuStep lvl 4 UE/LE x 5 min while taking subjective LAQ 2x10 L Seated ball squeeze 2x10 Seated clamshell 3x10 RTB Seated march x 10 Supine glute set x 10  Supine heel slide x 10 L Gait rolled into therex amb step-through pattern x 15925ft Standing heel-toe raises x 15 Standing hip abduction L 2x10  [facility] Adult PT Treatment:                                                DATE: 06/23/2022 Therapeutic Exercise: Ankle pumps x 10 Seated glute set x 10 - supine increased pain Review of other HEP   PATIENT EDUCATION:  Education details: continue HEP Person educated: Patient Education method: Explanation, Demonstration, and Handouts Education comprehension: verbalized understanding and returned demonstration   HOME EXERCISE PROGRAM: Access Code: [email or web address] URL: [email or web address] Date: 06/23/2022 Prepared by: Edwinna Areolaavid Shequila Neglia   Exercises - Supine Ankle Pumps  - 8 x daily - 7 x weekly - 2 sets - 20  reps - Supine Gluteal Sets  - 5 x daily - 7 x weekly - 2 sets - 10 reps - 3 sec hold - Supine Hip Abduction  - 5 x daily - 7 x weekly - 2 sets - 10 reps - Standing Hip Abduction with Counter Support  - 5 x daily - 7 x weekly - 10 reps - Standing Hip Extension with Counter Support  - 5 x daily - 7 x weekly - 10 reps   ASSESSMENT:   CLINICAL IMPRESSION: Pt was able to complete all prescribed exercises with no adverse effect.Therapy today focused on improving hip strength, gait, and functional mobility post op. After trial of quad cane PT recommended using a SPC if she can acquire one and start to transition to that inside of home vs FWW. She continues to benefit from skilled PT services, will continue to progress post op as tolerated.     OBJECTIVE IMPAIRMENTS: Abnormal gait, decreased activity tolerance, decreased balance, decreased mobility, difficulty walking, decreased ROM, decreased strength, and pain.    ACTIVITY LIMITATIONS: bending, sitting, standing, squatting, stairs, transfers, and locomotion level   PARTICIPATION LIMITATIONS: meal prep, cleaning, laundry, driving, shopping, community activity, and yard work   PERSONAL FACTORS: Fitness and 1-2 comorbidities: HTN, Depression  are also affecting patient's functional outcome.     GOALS: Goals reviewed with patient? No   SHORT TERM GOALS: Target date: 07/14/2022   Pt will be compliant and knowledgeable with initial HEP for improved comfort and carryover Baseline: initial HEP given  Goal status: MET Pt reports adherence 07/01/22   2.  Pt will self report  left hip pain no greater than 6/10 for improved comfort and functional ability Baseline: 10/10 at worst Goal status: MET   LONG TERM GOALS: Target date: 09/15/2022   Pt will improve FOTO function score to no less than 69% as proxy for functional improvement Baseline: 36% function Goal status: INITIAL    2.  Pt will self report left hip pain no greater than 3/10 for improved  comfort and functional ability Baseline: 10/10 at worst Goal status: INITIAL    3.  Pt will increase 30 Second Sit to Stand rep count to no less than 8 reps for improved balance, strength, and functional mobility Baseline: 2 reps  Goal status: INITIAL    4.  Pt will be able to ambulate 1 mile not limited by pain for return to desired recreational activity post surgery Baseline: unable Goal status: INITIAL     PLAN:   PT FREQUENCY: 2x/week   PT DURATION: 12 weeks   PLANNED INTERVENTIONS: Therapeutic exercises, Therapeutic activity, Neuromuscular re-education, Balance training, Gait training, Patient/Family education, Self Care, and Joint mobilization   PLAN FOR NEXT SESSION: assess HEP response, progress strength and functional mobility post surgery   Eloy End, PT 07/10/2022, 4:14 PM

## 2022-07-15 ENCOUNTER — Ambulatory Visit
Admission: RE | Admit: 2022-07-15 | Discharge: 2022-07-15 | Disposition: A | Discharge status: Home / Self Care | Payer: Medicare HMO | Source: Ambulatory Visit | Attending: Family Medicine | Admitting: Family Medicine

## 2022-07-15 DIAGNOSIS — E2839 Other primary ovarian failure: Secondary | ICD-10-CM

## 2022-07-22 ENCOUNTER — Ambulatory Visit: Payer: Medicare HMO

## 2022-07-22 DIAGNOSIS — M6281 Muscle weakness (generalized): Secondary | ICD-10-CM

## 2022-07-22 DIAGNOSIS — R2689 Other abnormalities of gait and mobility: Secondary | ICD-10-CM

## 2022-07-22 DIAGNOSIS — M25552 Pain in left hip: Secondary | ICD-10-CM | POA: Diagnosis not present

## 2022-07-22 NOTE — Therapy (Addendum)
OUTPATIENT PHYSICAL THERAPY TREATMENT NOTE/DISCHARGE  PHYSICAL THERAPY DISCHARGE SUMMARY  Visits from Start of Care: 6  Current functional level related to goals / functional outcomes: See goals/objective   Remaining deficits: Unable to assess   Education / Equipment: HEP   Patient agrees to discharge. Patient goals were unable to assess. Patient is being discharged due to not returning since the last visit.     Patient Name: Cynthia Hess MRN: [id number] DOB:Apr 15, 1955, 67 y.o., female Today's Date: 07/22/2022  PCP: Elayne Guerin, MD  REFERRING PROVIDER: Joen Laura, MD   END OF SESSION:   PT End of Session - 07/22/22 1500     Visit Number 6    Number of Visits 21    Date for PT Re-Evaluation 09/15/22    Authorization Type [employer] MCR    Authorization Time Period 12 visits approved 06/26/22-08/09/22    Authorization - Number of Visits 12    PT Start Time 1500   arrived late   PT Stop Time 1528    PT Time Calculation (min) 28 min    Activity Tolerance Patient tolerated treatment well    Behavior During Therapy [facility] for tasks assessed/performed                 Past Medical History:  Diagnosis Date   Allergic rhinitis 10/03/2016   Arthritis    in low back   Asthma 2009   CLOSED FRACTURE OF METATARSAL BONE 12/29/2008   Annotation: minimally displaced fracture through the fifth metatarsal Qualifier: Diagnosis of  By: Daphine Deutscher FNP, [employer]     COSTOCHONDRITIS 09/04/2008   Qualifier: Diagnosis of  By: Daphine Deutscher FNP, Fernande Boyden Quervain's tenosynovitis, left 10/03/2016   DEGENERATIVE DISC DISEASE, CERVICAL SPINE 10/18/2007   Qualifier: Diagnosis of  By: Daphine Deutscher FNP, [facility]     DENTAL CARIES 02/04/2008   Qualifier: Diagnosis of  By: Daphine Deutscher FNP, [employer]     Depression    meds in the past/ denies 06/16/13   HERNIATED LUMBOSACRAL DISC 05/02/2008   Qualifier: Diagnosis of  By: Shannon, [place]     History of kidney stones    Hyperlipidemia 2013    Hypertension 2009   Left hip pain 11/02/2013   Lumbosacral spondylosis without myelopathy 10/03/2011   Microscopic hematuria 04/22/2007   Qualifier: Diagnosis of  By: [name]     Mood changes 11/18/2018   NEPHROLITHIASIS 05/21/2007   Annotation: bilateral Qualifier: Diagnosis of  By: Daphine Deutscher FNP, [employer]     Neuropathy, cervical (radicular) 02/03/2018   Obesity (BMI 30-39.9) 06/03/2013   Osteoarthritis of spine 02/09/2008   Qualifier: Diagnosis of  By: Daphine Deutscher FNP, [employer]     PAIN IN SOFT TISSUES OF LIMB 04/15/2007   Qualifier: Diagnosis of  By: Daphine Deutscher FNP, [employer]     Palpitations 11/18/2018   Primary hypertension 09/01/2013   Recurrent incisional hernia with incarceration s/p lap repair w mesh 06/24/2013 05/21/2007   Qualifier: Diagnosis of  By: Daphine Deutscher FNP, [employer]     S/P hernia repair 06/24/2013   S/P lumbar spinal fusion 09/29/2014   Sciatica 10/03/2011   SUBACROMIAL BURSITIS, LEFT 10/30/2007   Annotation: per MRI Qualifier: Diagnosis of  By: Daphine Deutscher FNP, [place]     Tobacco use disorder 06/03/2013   TRANSAMINASES, SERUM, ELEVATED 02/19/2010   Qualifier: Diagnosis of  By: Daphine Deutscher FNP, Zena Amos     TRICHOMONIASIS 04/22/2007   Qualifier: History of  By: Daphine Deutscher FNP, [employer]     Vaginal dryness 05/19/2016   WRIST  PAIN, BILATERAL 01/09/2010   Qualifier: Diagnosis of  By: Daphine Deutscher FNP, Zena Amos     Past Surgical History:  Procedure Laterality Date   ABDOMINAL HYSTERECTOMY  04/01/1987   still gets pap smears   BLADDER REPAIR  04/01/1987   ?bladder injury   DILATION AND CURETTAGE OF UTERUS     HERNIA REPAIR  1990s   reports total of 3 surgeries, most recently 2007   INSERTION OF MESH N/A 06/24/2013   Procedure: INSERTION OF MESH;  Surgeon: Ardeth Sportsman, MD;  Location: [facility];  Service: General;  Laterality: N/A;   MAXIMUM ACCESS (MAS)POSTERIOR LUMBAR INTERBODY FUSION (PLIF) 2 LEVEL N/A 09/29/2014   Procedure: FOR MAXIMUM ACCESS (MAS) POSTERIOR LUMBAR INTERBODY  FUSION  LUMBAR FOUR-FIVE,LUMBAR FIVE-SACRAL ONE;  Surgeon: Tia Alert, MD;  Location: [facility];  Service: Neurosurgery;  Laterality: N/A;   MULTIPLE TOOTH EXTRACTIONS     SMALL INTESTINE SURGERY  04/01/1987   for "blockage"   TONSILLECTOMY  03/31/1965   TOTAL HIP ARTHROPLASTY Left 06/20/2022   Procedure: TOTAL HIP ARTHROPLASTY;  Surgeon: Joen Laura, MD;  Location: [facility];  Service: Orthopedics;  Laterality: Left;   TUBAL LIGATION  03/31/1977   UMBILICAL HERNIA REPAIR  04/01/2003   open w suture   VENTRAL HERNIA REPAIR  03/31/2005   open w mesh Dr Zachery Dakins   VENTRAL HERNIA REPAIR N/A 06/24/2013   Procedure: LAPAROSCOPIC VENTRAL WALL HERNIA REPAIR;  Surgeon: Ardeth Sportsman, MD;  Location: [facility];  Service: General;  Laterality: N/A;   Patient Active Problem List   Diagnosis Date Noted   Primary osteoarthritis of left hip 06/20/2022   Osteoarthritis of left hip 06/20/2022   Allergic dermatitis 04/03/2020   Dyspepsia 11/10/2019   Chronic back pain 11/10/2019   Hyperlipidemia 11/10/2019   Palpitations 11/18/2018   Mood changes 11/18/2018   Neuropathy, cervical (radicular) 02/03/2018   Allergic rhinitis 10/03/2016   Vaginal dryness 05/19/2016   S/P lumbar spinal fusion 09/29/2014   Primary hypertension 09/01/2013   S/P hernia repair 06/24/2013   Obesity (BMI 30-39.9) 06/03/2013   Tobacco use disorder 06/03/2013   Lumbosacral spondylosis without myelopathy 10/03/2011   TRANSAMINASES, SERUM, ELEVATED 02/19/2010   Osteoarthritis of spine 02/09/2008   SUBACROMIAL BURSITIS, LEFT 10/30/2007   DEGENERATIVE DISC DISEASE, CERVICAL SPINE 10/18/2007   Recurrent incisional hernia with incarceration s/p lap repair w mesh 06/24/2013 05/21/2007   MICROSCOPIC HEMATURIA 04/22/2007    REFERRING DIAG: M16.12 (ICD-10-CM) - Primary osteoarthritis of left hip   THERAPY DIAG:  Pain in left hip  Muscle weakness (generalized)  Other abnormalities of gait and mobility  Rationale  for Evaluation and Treatment Rehabilitation  PERTINENT HISTORY: HTN, Depression   PRECAUTIONS: Posterior hip   SUBJECTIVE:  SUBJECTIVE STATEMENT:  Pt presents to PT with no current pain. Notes HEP has been going well.    PAIN:  Are you having pain?  Yes: NPRS scale: 3/10 Worst: 10/10 Pain location: L hip Pain description: sharp, post surgical  Aggravating factors: walking, standing, Relieving factors: rest, medication   OBJECTIVE: (objective measures completed at initial evaluation unless otherwise dated)  DIAGNOSTIC FINDINGS:             See imaging   PATIENT SURVEYS:  FOTO: 69% function; 69% predicted - 07/22/22   COGNITION: Overall cognitive status: Within functional limits for tasks assessed                         SENSATION: WFL   EDEMA:  DNT   POSTURE: rounded shoulders and forward head   PALPATION: DNT   LOWER EXTREMITY MMT:   MMT Right eval Left eval  Hip flexion 4/5 DNT  Hip extension      Hip abduction 4/5 DNT  Hip adduction      Hip internal rotation      Hip external rotation      Knee flexion      Knee extension      Ankle dorsiflexion      Ankle plantarflexion      Ankle inversion      Ankle eversion       (Blank rows = not tested)   LOWER EXTREMITY SPECIAL TESTS:  DNT   FUNCTIONAL TESTS:  30 Second Sit to Stand: 2 reps TUG: 45 seconds with FWW   GAIT: Distance walked: 19ft Assistive device utilized: Environmental consultant - 2 wheeled Level of assistance: Modified independence Comments: antalgic gait L    TREATMENT: [facility] Adult PT Treatment:                                                DATE: 07/22/22 Therapeutic Exercise: NuStep lvl 5 UE/LE x 3 min while taking subjective Supine SLR 2x10 STS 2x10 - no UE support Standing hip abd/ext 2x10 RTB Lateral walk at  counter RTB x 3 laps  Step ups 8in x 10 L leading Amb with SPC in R hand x 161ft with CGA; cues   [facility] Adult PT Treatment:                                                DATE: 07/10/22 Therapeutic Exercise: NuStep lvl 5 UE/LE x 5 min while taking subjective STS 2x10 - no UE support Supine quad set x 10 - 5" hold Bridge 2x10 PT assisted L SLR eccentric lowering x 5 Supine clamshell 2x15 BTB Standing hip abd/ext 2x10 RTB Lateral walk at counter RTB x 3 laps  Standing hip abd/ext 2x10 YTB LAQ 3x10 2.5# Gait training: Amb with quad cane in R hand x 173ft with CGA; cues   [facility] Adult PT Treatment:                                                DATE: 07/08/22 Therapeutic Exercise: NuStep lvl 4 UE/LE x 5  min while taking subjective Supine quad set x 10 L - 5"hold PT assisted SLR x 5 - L  Bridge 2x10 Supine clamshell 2x15 GTB Supine march x 20 GTB STS 2x10 - no UE Lateral walk at counter YTB x 3 laps  Standing hip abd/ext 2x10 YTB Mini squat with B UE support 2x10 Gait training: Amb with SPC in R hand x 139ft with CGA; cues   [facility] Adult PT Treatment:                                                DATE: 07/01/22 Therapeutic Exercise: NuStep lvl 4 UE/LE x 5 min while taking subjective Supine marching 2x30" LAQ 2x10 L Seated ball squeeze 5" hold x10 (pain in anterior hip) Seated clamshell 3x10 RTB Seated hamstring curl RTB x10 Lt Gait rolled into therex amb step-through pattern x 146ft STS x10 - cues to WS onto LLE Standing heel-toe raises x 15 Standing hip abduction L 2x10 Step ups 4" Lt leading 2x10 fwd/lat  PATIENT EDUCATION:  Education details: continue HEP Person educated: Patient Education method: Explanation, Demonstration, and Handouts Education comprehension: verbalized understanding and returned demonstration   HOME EXERCISE PROGRAM: Access Code: [email or web address] URL: [email or web address] Date: 07/22/2022 Prepared by: Edwinna Areola  Exercises - Active  Straight Leg Raise with Quad Set  - 1 x daily - 7 x weekly - 2 sets - 10 reps - Supine Bridge  - 1 x daily - 7 x weekly - 3 sets - 10 reps - Mini Squat with Counter Support  - 1 x daily - 7 x weekly - 2 sets - 10 reps - Standing Hip Abduction with Resistance at Ankles and Counter Support  - 1 x daily - 7 x weekly - 2 sets - 10 reps - yellow band hold - Standing Hip Extension with Resistance at Ankles and Counter Support  - 1 x daily - 7 x weekly - 2 sets - 10 reps - yellow band hold   ASSESSMENT:   CLINICAL IMPRESSION: Pt was able to complete prescribed exercises with no adverse effect. Noted subjective functional improvement with met FOTO score and completed LTG. Pt is progressing well with therapy, continued to focus on improving LE strength and gait post surgery. Will continue to progress per POC.    OBJECTIVE IMPAIRMENTS: Abnormal gait, decreased activity tolerance, decreased balance, decreased mobility, difficulty walking, decreased ROM, decreased strength, and pain.    ACTIVITY LIMITATIONS: bending, sitting, standing, squatting, stairs, transfers, and locomotion level   PARTICIPATION LIMITATIONS: meal prep, cleaning, laundry, driving, shopping, community activity, and yard work   PERSONAL FACTORS: Fitness and 1-2 comorbidities: HTN, Depression  are also affecting patient's functional outcome.     GOALS: Goals reviewed with patient? No   SHORT TERM GOALS: Target date: 07/14/2022   Pt will be compliant and knowledgeable with initial HEP for improved comfort and carryover Baseline: initial HEP given  Goal status: MET Pt reports adherence 07/01/22   2.  Pt will self report left hip pain no greater than 6/10 for improved comfort and functional ability Baseline: 10/10 at worst Goal status: MET   LONG TERM GOALS: Target date: 09/15/2022   Pt will improve FOTO function score to no less than 69% as proxy for functional improvement Baseline: 36% function 07/22/2022: 69% function  Goal  status: MET   2.  Pt will self  report left hip pain no greater than 3/10 for improved comfort and functional ability Baseline: 10/10 at worst Goal status: INITIAL    3.  Pt will increase 30 Second Sit to Stand rep count to no less than 8 reps for improved balance, strength, and functional mobility Baseline: 2 reps  Goal status: INITIAL    4.  Pt will be able to ambulate 1 mile not limited by pain for return to desired recreational activity post surgery Baseline: unable Goal status: INITIAL     PLAN:   PT FREQUENCY: 2x/week   PT DURATION: 12 weeks   PLANNED INTERVENTIONS: Therapeutic exercises, Therapeutic activity, Neuromuscular re-education, Balance training, Gait training, Patient/Family education, Self Care, and Joint mobilization   PLAN FOR NEXT SESSION: assess HEP response, progress strength and functional mobility post surgery   Eloy End, PT 07/22/2022, 3:59 PM

## 2022-07-24 ENCOUNTER — Ambulatory Visit: Payer: Medicare HMO

## 2022-07-29 ENCOUNTER — Ambulatory Visit: Payer: Medicare HMO

## 2022-07-31 ENCOUNTER — Ambulatory Visit: Payer: Medicare HMO

## 2022-08-05 ENCOUNTER — Ambulatory Visit: Payer: Medicare HMO | Attending: Orthopedic Surgery

## 2022-08-07 ENCOUNTER — Ambulatory Visit: Payer: Medicare HMO

## 2022-08-12 ENCOUNTER — Ambulatory Visit: Payer: Medicare HMO

## 2022-08-14 ENCOUNTER — Ambulatory Visit: Payer: Medicare HMO

## 2022-09-23 ENCOUNTER — Other Ambulatory Visit: Payer: Self-pay | Admitting: Family Medicine

## 2022-09-23 DIAGNOSIS — J301 Allergic rhinitis due to pollen: Secondary | ICD-10-CM

## 2022-11-13 LAB — COLOGUARD

## 2022-12-12 LAB — COLOGUARD: COLOGUARD: NEGATIVE

## 2023-05-18 ENCOUNTER — Emergency Department (HOSPITAL_COMMUNITY): Payer: 59

## 2023-05-18 ENCOUNTER — Inpatient Hospital Stay (HOSPITAL_COMMUNITY): Payer: 59

## 2023-05-18 ENCOUNTER — Inpatient Hospital Stay (HOSPITAL_COMMUNITY)
Admission: EM | Admit: 2023-05-18 | Discharge: 2023-05-30 | DRG: 871 | Disposition: E | Payer: 59 | Attending: Internal Medicine | Admitting: Internal Medicine

## 2023-05-18 ENCOUNTER — Other Ambulatory Visit: Payer: Self-pay

## 2023-05-18 DIAGNOSIS — J9601 Acute respiratory failure with hypoxia: Principal | ICD-10-CM | POA: Diagnosis present

## 2023-05-18 DIAGNOSIS — Z8249 Family history of ischemic heart disease and other diseases of the circulatory system: Secondary | ICD-10-CM | POA: Diagnosis not present

## 2023-05-18 DIAGNOSIS — Z825 Family history of asthma and other chronic lower respiratory diseases: Secondary | ICD-10-CM

## 2023-05-18 DIAGNOSIS — J1100 Influenza due to unidentified influenza virus with unspecified type of pneumonia: Secondary | ICD-10-CM | POA: Diagnosis present

## 2023-05-18 DIAGNOSIS — Z90710 Acquired absence of both cervix and uterus: Secondary | ICD-10-CM

## 2023-05-18 DIAGNOSIS — J189 Pneumonia, unspecified organism: Secondary | ICD-10-CM

## 2023-05-18 DIAGNOSIS — R578 Other shock: Secondary | ICD-10-CM | POA: Diagnosis not present

## 2023-05-18 DIAGNOSIS — A4189 Other specified sepsis: Secondary | ICD-10-CM | POA: Diagnosis present

## 2023-05-18 DIAGNOSIS — R042 Hemoptysis: Secondary | ICD-10-CM | POA: Diagnosis not present

## 2023-05-18 DIAGNOSIS — Z820 Family history of epilepsy and other diseases of the nervous system: Secondary | ICD-10-CM

## 2023-05-18 DIAGNOSIS — R6521 Severe sepsis with septic shock: Secondary | ICD-10-CM | POA: Diagnosis present

## 2023-05-18 DIAGNOSIS — Z96642 Presence of left artificial hip joint: Secondary | ICD-10-CM | POA: Diagnosis present

## 2023-05-18 DIAGNOSIS — J9602 Acute respiratory failure with hypercapnia: Secondary | ICD-10-CM | POA: Diagnosis present

## 2023-05-18 DIAGNOSIS — Z981 Arthrodesis status: Secondary | ICD-10-CM | POA: Diagnosis not present

## 2023-05-18 DIAGNOSIS — K7200 Acute and subacute hepatic failure without coma: Secondary | ICD-10-CM | POA: Diagnosis present

## 2023-05-18 DIAGNOSIS — G9340 Encephalopathy, unspecified: Secondary | ICD-10-CM | POA: Diagnosis present

## 2023-05-18 DIAGNOSIS — I469 Cardiac arrest, cause unspecified: Secondary | ICD-10-CM | POA: Diagnosis present

## 2023-05-18 DIAGNOSIS — F17210 Nicotine dependence, cigarettes, uncomplicated: Secondary | ICD-10-CM | POA: Diagnosis present

## 2023-05-18 DIAGNOSIS — N170 Acute kidney failure with tubular necrosis: Secondary | ICD-10-CM | POA: Diagnosis present

## 2023-05-18 DIAGNOSIS — I468 Cardiac arrest due to other underlying condition: Secondary | ICD-10-CM | POA: Diagnosis present

## 2023-05-18 DIAGNOSIS — I10 Essential (primary) hypertension: Secondary | ICD-10-CM | POA: Diagnosis present

## 2023-05-18 DIAGNOSIS — Z833 Family history of diabetes mellitus: Secondary | ICD-10-CM

## 2023-05-18 DIAGNOSIS — D689 Coagulation defect, unspecified: Secondary | ICD-10-CM | POA: Diagnosis present

## 2023-05-18 DIAGNOSIS — E872 Acidosis, unspecified: Secondary | ICD-10-CM | POA: Diagnosis present

## 2023-05-18 DIAGNOSIS — I214 Non-ST elevation (NSTEMI) myocardial infarction: Secondary | ICD-10-CM | POA: Diagnosis present

## 2023-05-18 DIAGNOSIS — N179 Acute kidney failure, unspecified: Secondary | ICD-10-CM

## 2023-05-18 DIAGNOSIS — Z7401 Bed confinement status: Secondary | ICD-10-CM

## 2023-05-18 DIAGNOSIS — Z79899 Other long term (current) drug therapy: Secondary | ICD-10-CM

## 2023-05-18 DIAGNOSIS — Z66 Do not resuscitate: Secondary | ICD-10-CM | POA: Diagnosis not present

## 2023-05-18 DIAGNOSIS — R609 Edema, unspecified: Secondary | ICD-10-CM

## 2023-05-18 DIAGNOSIS — J111 Influenza due to unidentified influenza virus with other respiratory manifestations: Secondary | ICD-10-CM | POA: Diagnosis present

## 2023-05-18 DIAGNOSIS — E785 Hyperlipidemia, unspecified: Secondary | ICD-10-CM | POA: Diagnosis present

## 2023-05-18 DIAGNOSIS — R579 Shock, unspecified: Secondary | ICD-10-CM

## 2023-05-18 DIAGNOSIS — Z555 Less than a high school diploma: Secondary | ICD-10-CM

## 2023-05-18 DIAGNOSIS — E162 Hypoglycemia, unspecified: Secondary | ICD-10-CM | POA: Diagnosis present

## 2023-05-18 LAB — RENAL FUNCTION PANEL
Albumin: 1.9 g/dL — ABNORMAL LOW (ref 3.5–5.0)
Anion gap: 38 — ABNORMAL HIGH (ref 5–15)
BUN: 41 mg/dL — ABNORMAL HIGH (ref 8–23)
CO2: 13 mmol/L — ABNORMAL LOW (ref 22–32)
Calcium: 6.4 mg/dL — CL (ref 8.9–10.3)
Chloride: 92 mmol/L — ABNORMAL LOW (ref 98–111)
Creatinine, Ser: 5.86 mg/dL — ABNORMAL HIGH (ref 0.44–1.00)
GFR, Estimated: 7 mL/min — ABNORMAL LOW (ref >60)
Glucose, Bld: 58 mg/dL — ABNORMAL LOW (ref 70–99)
Phosphorus: >30 mg/dL — ABNORMAL HIGH (ref 2.5–4.6)
Potassium: 4.7 mmol/L (ref 3.5–5.1)
Sodium: 143 mmol/L (ref 135–145)

## 2023-05-18 LAB — PROCALCITONIN: Procalcitonin: >150 ng/mL

## 2023-05-18 LAB — CBC WITH DIFFERENTIAL/PLATELET
Abs Immature Granulocytes: 0 10*3/uL (ref 0.00–0.07)
Abs Immature Granulocytes: 0.1 10*3/uL — ABNORMAL HIGH (ref 0.00–0.07)
Band Neutrophils: 1 %
Basophils Absolute: 0 10*3/uL (ref 0.0–0.1)
Basophils Absolute: 0 10*3/uL (ref 0.0–0.1)
Basophils Relative: 0 %
Basophils Relative: 0 %
Eosinophils Absolute: 0 10*3/uL (ref 0.0–0.5)
Eosinophils Absolute: 0 10*3/uL (ref 0.0–0.5)
Eosinophils Relative: 0 %
Eosinophils Relative: 3 %
HCT: 39.4 % (ref 36.0–46.0)
HCT: 36.1 % (ref 36.0–46.0)
HCT: 33.2 % — ABNORMAL LOW (ref 36.0–46.0)
Hemoglobin: 12.1 g/dL (ref 12.0–15.0)
Hemoglobin: 10.8 g/dL — ABNORMAL LOW (ref 12.0–15.0)
Hemoglobin: 9.8 g/dL — ABNORMAL LOW (ref 12.0–15.0)
Lymphocytes Relative: 15 %
Lymphocytes Relative: 73 %
Lymphs Abs: 1 10*3/uL (ref 0.7–4.0)
Lymphs Abs: 0.9 10*3/uL (ref 0.7–4.0)
MCH: 32.6 pg (ref 26.0–34.0)
MCH: 32 pg (ref 26.0–34.0)
MCH: 32.2 pg (ref 26.0–34.0)
MCHC: 30.7 g/dL (ref 30.0–36.0)
MCHC: 29.9 g/dL — ABNORMAL LOW (ref 30.0–36.0)
MCHC: 29.5 g/dL — ABNORMAL LOW (ref 30.0–36.0)
MCV: 106.2 fL — ABNORMAL HIGH (ref 80.0–100.0)
MCV: 107.1 fL — ABNORMAL HIGH (ref 80.0–100.0)
MCV: 109.2 fL — ABNORMAL HIGH (ref 80.0–100.0)
Metamyelocytes Relative: 4 %
Monocytes Absolute: 0.6 10*3/uL (ref 0.1–1.0)
Monocytes Absolute: 0 10*3/uL — ABNORMAL LOW (ref 0.1–1.0)
Monocytes Relative: 9 %
Monocytes Relative: 1 %
Myelocytes: 3 %
Neutro Abs: 5.2 10*3/uL (ref 1.7–7.7)
Neutro Abs: 0.2 10*3/uL — CL (ref 1.7–7.7)
Neutrophils Relative %: 75 %
Neutrophils Relative %: 16 %
Platelets: 191 10*3/uL (ref 150–400)
Platelets: 145 10*3/uL — ABNORMAL LOW (ref 150–400)
Platelets: 131 10*3/uL — ABNORMAL LOW (ref 150–400)
RBC: 3.71 MIL/uL — ABNORMAL LOW (ref 3.87–5.11)
RBC: 3.37 MIL/uL — ABNORMAL LOW (ref 3.87–5.11)
RBC: 3.04 MIL/uL — ABNORMAL LOW (ref 3.87–5.11)
RDW: 14.9 % (ref 11.5–15.5)
RDW: 15.9 % — ABNORMAL HIGH (ref 11.5–15.5)
RDW: 16.2 % — ABNORMAL HIGH (ref 11.5–15.5)
WBC: 6.8 10*3/uL (ref 4.0–10.5)
WBC: 1.2 10*3/uL — CL (ref 4.0–10.5)
nRBC: 1.2 % — ABNORMAL HIGH (ref 0.0–0.2)
nRBC: 3 /100{WBCs} — ABNORMAL HIGH
nRBC: 13.4 % — ABNORMAL HIGH (ref 0.0–0.2)
nRBC: 40 /100{WBCs} — ABNORMAL HIGH

## 2023-05-18 LAB — I-STAT CHEM 8, ED
BUN: 47 mg/dL — ABNORMAL HIGH (ref 8–23)
Calcium, Ion: 0.72 mmol/L — CL (ref 1.15–1.40)
Chloride: 99 mmol/L (ref 98–111)
Creatinine, Ser: 5.1 mg/dL — ABNORMAL HIGH (ref 0.44–1.00)
Glucose, Bld: 417 mg/dL — ABNORMAL HIGH (ref 70–99)
HCT: 37 % (ref 36.0–46.0)
Hemoglobin: 12.6 g/dL (ref 12.0–15.0)
Potassium: 3.4 mmol/L — ABNORMAL LOW (ref 3.5–5.1)
Sodium: 134 mmol/L — ABNORMAL LOW (ref 135–145)
TCO2: 11 mmol/L — ABNORMAL LOW (ref 22–32)

## 2023-05-18 LAB — RESPIRATORY PANEL BY PCR

## 2023-05-18 LAB — COMPREHENSIVE METABOLIC PANEL
ALT: 4781 U/L — ABNORMAL HIGH (ref 0–44)
ALT: 4232 U/L — ABNORMAL HIGH (ref 0–44)
ALT: 2949 U/L — ABNORMAL HIGH (ref 0–44)
AST: >7000 U/L — ABNORMAL HIGH (ref 15–41)
AST: >7000 U/L — ABNORMAL HIGH (ref 15–41)
AST: >7000 U/L — ABNORMAL HIGH (ref 15–41)
Albumin: 2.8 g/dL — ABNORMAL LOW (ref 3.5–5.0)
Albumin: 2 g/dL — ABNORMAL LOW (ref 3.5–5.0)
Albumin: <1.5 g/dL — ABNORMAL LOW (ref 3.5–5.0)
Alkaline Phosphatase: 183 U/L — ABNORMAL HIGH (ref 38–126)
Alkaline Phosphatase: 190 U/L — ABNORMAL HIGH (ref 38–126)
Alkaline Phosphatase: 147 U/L — ABNORMAL HIGH (ref 38–126)
Anion gap: 37 — ABNORMAL HIGH (ref 5–15)
Anion gap: 29 — ABNORMAL HIGH (ref 5–15)
BUN: 41 mg/dL — ABNORMAL HIGH (ref 8–23)
BUN: 41 mg/dL — ABNORMAL HIGH (ref 8–23)
BUN: 24 mg/dL — ABNORMAL HIGH (ref 8–23)
CO2: <7 mmol/L — ABNORMAL LOW (ref 22–32)
CO2: 12 mmol/L — ABNORMAL LOW (ref 22–32)
CO2: 23 mmol/L (ref 22–32)
Calcium: 6.2 mg/dL — CL (ref 8.9–10.3)
Calcium: 6.6 mg/dL — ABNORMAL LOW (ref 8.9–10.3)
Calcium: >15 mg/dL (ref 8.9–10.3)
Chloride: 95 mmol/L — ABNORMAL LOW (ref 98–111)
Chloride: 93 mmol/L — ABNORMAL LOW (ref 98–111)
Chloride: 98 mmol/L (ref 98–111)
Creatinine, Ser: 5.45 mg/dL — ABNORMAL HIGH (ref 0.44–1.00)
Creatinine, Ser: 5.8 mg/dL — ABNORMAL HIGH (ref 0.44–1.00)
Creatinine, Ser: 3.39 mg/dL — ABNORMAL HIGH (ref 0.44–1.00)
GFR, Estimated: 8 mL/min — ABNORMAL LOW (ref >60)
GFR, Estimated: 8 mL/min — ABNORMAL LOW (ref >60)
GFR, Estimated: 14 mL/min — ABNORMAL LOW (ref >60)
Glucose, Bld: 398 mg/dL — ABNORMAL HIGH (ref 70–99)
Glucose, Bld: 112 mg/dL — ABNORMAL HIGH (ref 70–99)
Glucose, Bld: 30 mg/dL — CL (ref 70–99)
Potassium: 3.5 mmol/L (ref 3.5–5.1)
Potassium: 4.2 mmol/L (ref 3.5–5.1)
Potassium: >7.5 mmol/L (ref 3.5–5.1)
Sodium: 139 mmol/L (ref 135–145)
Sodium: 142 mmol/L (ref 135–145)
Sodium: 150 mmol/L — ABNORMAL HIGH (ref 135–145)
Total Bilirubin: 4.3 mg/dL — ABNORMAL HIGH (ref 0.0–1.2)
Total Bilirubin: 3.9 mg/dL — ABNORMAL HIGH (ref 0.0–1.2)
Total Bilirubin: 3 mg/dL — ABNORMAL HIGH (ref 0.0–1.2)
Total Protein: 5.9 g/dL — ABNORMAL LOW (ref 6.5–8.1)
Total Protein: 4.6 g/dL — ABNORMAL LOW (ref 6.5–8.1)
Total Protein: 3.2 g/dL — ABNORMAL LOW (ref 6.5–8.1)

## 2023-05-18 LAB — POCT I-STAT 7, (LYTES, BLD GAS, ICA,H+H)
Acid-base deficit: 22 mmol/L — ABNORMAL HIGH (ref 0.0–2.0)
Bicarbonate: 10.2 mmol/L — ABNORMAL LOW (ref 20.0–28.0)
Calcium, Ion: 0.9 mmol/L — ABNORMAL LOW (ref 1.15–1.40)
HCT: 30 % — ABNORMAL LOW (ref 36.0–46.0)
Hemoglobin: 10.2 g/dL — ABNORMAL LOW (ref 12.0–15.0)
O2 Saturation: 89 %
Patient temperature: 98.7
Potassium: 4 mmol/L (ref 3.5–5.1)
Sodium: 135 mmol/L (ref 135–145)
TCO2: 12 mmol/L — ABNORMAL LOW (ref 22–32)
pCO2 arterial: 54.4 mm[Hg] — ABNORMAL HIGH (ref 32–48)
pH, Arterial: 6.881 — CL (ref 7.35–7.45)
pO2, Arterial: 95 mm[Hg] (ref 83–108)

## 2023-05-18 LAB — I-STAT VENOUS BLOOD GAS, ED
Acid-base deficit: 25 mmol/L — ABNORMAL HIGH (ref 0.0–2.0)
Bicarbonate: 8.3 mmol/L — ABNORMAL LOW (ref 20.0–28.0)
Calcium, Ion: 0.74 mmol/L — CL (ref 1.15–1.40)
HCT: 36 % (ref 36.0–46.0)
Hemoglobin: 12.2 g/dL (ref 12.0–15.0)
O2 Saturation: 88 %
Potassium: 3.4 mmol/L — ABNORMAL LOW (ref 3.5–5.1)
Sodium: 135 mmol/L (ref 135–145)
TCO2: 10 mmol/L — ABNORMAL LOW (ref 22–32)
pCO2, Ven: 49 mm[Hg] (ref 44–60)
pH, Ven: 6.839 — CL (ref 7.25–7.43)
pO2, Ven: 96 mm[Hg] — ABNORMAL HIGH (ref 32–45)

## 2023-05-18 LAB — HIV ANTIBODY (ROUTINE TESTING W REFLEX): HIV Screen 4th Generation wRfx: NONREACTIVE

## 2023-05-18 LAB — I-STAT ARTERIAL BLOOD GAS, ED
Acid-base deficit: 26 mmol/L — ABNORMAL HIGH (ref 0.0–2.0)
Acid-base deficit: 25 mmol/L — ABNORMAL HIGH (ref 0.0–2.0)
Bicarbonate: 8.2 mmol/L — ABNORMAL LOW (ref 20.0–28.0)
Bicarbonate: 8.3 mmol/L — ABNORMAL LOW (ref 20.0–28.0)
Calcium, Ion: 0.78 mmol/L — CL (ref 1.15–1.40)
Calcium, Ion: 0.85 mmol/L — CL (ref 1.15–1.40)
HCT: 32 % — ABNORMAL LOW (ref 36.0–46.0)
HCT: 30 % — ABNORMAL LOW (ref 36.0–46.0)
Hemoglobin: 10.9 g/dL — ABNORMAL LOW (ref 12.0–15.0)
Hemoglobin: 10.2 g/dL — ABNORMAL LOW (ref 12.0–15.0)
O2 Saturation: 100 %
O2 Saturation: 100 %
Potassium: 3.6 mmol/L (ref 3.5–5.1)
Potassium: 4.1 mmol/L (ref 3.5–5.1)
Sodium: 137 mmol/L (ref 135–145)
Sodium: 137 mmol/L (ref 135–145)
TCO2: 10 mmol/L — ABNORMAL LOW (ref 22–32)
TCO2: 10 mmol/L — ABNORMAL LOW (ref 22–32)
pCO2 arterial: 50.8 mm[Hg] — ABNORMAL HIGH (ref 32–48)
pCO2 arterial: 51.6 mm[Hg] — ABNORMAL HIGH (ref 32–48)
pH, Arterial: 6.813 — CL (ref 7.35–7.45)
pH, Arterial: 6.817 — CL (ref 7.35–7.45)
pO2, Arterial: 375 mm[Hg] — ABNORMAL HIGH (ref 83–108)
pO2, Arterial: 403 mm[Hg] — ABNORMAL HIGH (ref 83–108)

## 2023-05-18 LAB — BLOOD CULTURE ID PANEL (REFLEXED) - BCID2

## 2023-05-18 LAB — ECHOCARDIOGRAM LIMITED
AR max vel: 2.8 cm2
AV Area VTI: 2.73 cm2
AV Area mean vel: 2.62 cm2
AV Mean grad: 6 mm[Hg]
AV Peak grad: 12 mm[Hg]
Ao pk vel: 1.73 m/s
Area-P 1/2: 1.96 cm2
Calc EF: 76.4 %
Height: 62 in
MV VTI: 2.35 cm2
S' Lateral: 2.4 cm
Single Plane A2C EF: 77.3 %
Single Plane A4C EF: 76.8 %
Weight: 2998.26 [oz_av]

## 2023-05-18 LAB — RESP PANEL BY RT-PCR (RSV, FLU A&B, COVID)  RVPGX2
Influenza A by PCR: POSITIVE — AB
Influenza B by PCR: NEGATIVE
Resp Syncytial Virus by PCR: NEGATIVE
SARS Coronavirus 2 by RT PCR: NEGATIVE

## 2023-05-18 LAB — MAGNESIUM
Magnesium: 2.9 mg/dL — ABNORMAL HIGH (ref 1.7–2.4)
Magnesium: 2.9 mg/dL — ABNORMAL HIGH (ref 1.7–2.4)

## 2023-05-18 LAB — APTT: aPTT: 40 s — ABNORMAL HIGH (ref 24–36)

## 2023-05-18 LAB — CBC
HCT: 17.1 % — ABNORMAL LOW (ref 36.0–46.0)
Hemoglobin: 5.1 g/dL — CL (ref 12.0–15.0)
MCH: 33.3 pg (ref 26.0–34.0)
MCHC: 29.8 g/dL — ABNORMAL LOW (ref 30.0–36.0)
MCV: 111.8 fL — ABNORMAL HIGH (ref 80.0–100.0)
Platelets: 15 10*3/uL — CL (ref 150–400)
RBC: 1.53 MIL/uL — ABNORMAL LOW (ref 3.87–5.11)
RDW: 16.5 % — ABNORMAL HIGH (ref 11.5–15.5)
WBC: 0.6 10*3/uL — CL (ref 4.0–10.5)
nRBC: 14.5 % — ABNORMAL HIGH (ref 0.0–0.2)

## 2023-05-18 LAB — PROTIME-INR
INR: 4 — ABNORMAL HIGH (ref 0.8–1.2)
INR: 8.3 (ref 0.8–1.2)
Prothrombin Time: 39.5 s — ABNORMAL HIGH (ref 11.4–15.2)
Prothrombin Time: 68.9 s — ABNORMAL HIGH (ref 11.4–15.2)

## 2023-05-18 LAB — HEMOGLOBIN A1C
Hgb A1c MFr Bld: 6 % — ABNORMAL HIGH (ref 4.8–5.6)
Mean Plasma Glucose: 125.5 mg/dL

## 2023-05-18 LAB — I-STAT CG4 LACTIC ACID, ED
Lactic Acid, Venous: 14 mmol/L (ref 0.5–1.9)
Lactic Acid, Venous: >15 mmol/L (ref 0.5–1.9)

## 2023-05-18 LAB — GLUCOSE, CAPILLARY
Glucose-Capillary: 233 mg/dL — ABNORMAL HIGH (ref 70–99)
Glucose-Capillary: 405 mg/dL — ABNORMAL HIGH (ref 70–99)
Glucose-Capillary: <10 mg/dL — CL (ref 70–99)

## 2023-05-18 LAB — BRAIN NATRIURETIC PEPTIDE: B Natriuretic Peptide: 101.8 pg/mL — ABNORMAL HIGH (ref 0.0–100.0)

## 2023-05-18 LAB — TROPONIN I (HIGH SENSITIVITY)
Troponin I (High Sensitivity): 368 ng/L (ref <18)
Troponin I (High Sensitivity): 1247 ng/L (ref <18)

## 2023-05-18 LAB — PHOSPHORUS: Phosphorus: >30 mg/dL — ABNORMAL HIGH (ref 2.5–4.6)

## 2023-05-18 LAB — CBG MONITORING, ED
Glucose-Capillary: 24 mg/dL — CL (ref 70–99)
Glucose-Capillary: 266 mg/dL — ABNORMAL HIGH (ref 70–99)

## 2023-05-18 LAB — LACTIC ACID, PLASMA
Lactic Acid, Venous: >9 mmol/L (ref 0.5–1.9)
Lactic Acid, Venous: >9 mmol/L (ref 0.5–1.9)
Lactic Acid, Venous: >9 mmol/L (ref 0.5–1.9)

## 2023-05-18 LAB — CORTISOL: Cortisol, Plasma: 31.9 ug/dL

## 2023-05-18 MED ORDER — SODIUM CHLORIDE 0.9 % IV SOLN: 2.0000 g | INTRAVENOUS | Status: DC

## 2023-05-18 MED ORDER — DEXTROSE 5 % IV SOLN
6.2500 mg/kg/h | INTRAVENOUS | Status: DC
  Administered 2023-05-18: 6.25 mg/kg/h via INTRAVENOUS
  Filled 2023-05-18: qty 90

## 2023-05-18 MED ORDER — CALCIUM GLUCONATE-NACL 1-0.675 GM/50ML-% IV SOLN
1.0000 g | Freq: Once | INTRAVENOUS | Status: AC
  Administered 2023-05-18: 1000 mg via INTRAVENOUS

## 2023-05-18 MED ORDER — ORAL CARE MOUTH RINSE: 15.0000 mL | OROMUCOSAL | Status: DC | PRN

## 2023-05-18 MED ORDER — DEXTROSE 50 % IV SOLN
INTRAVENOUS | Status: AC
Start: 2023-05-18 — End: 2023-05-18
  Administered 2023-05-18: 50 mL via INTRAVENOUS
  Filled 2023-05-18: qty 50

## 2023-05-18 MED ORDER — HEPARIN SODIUM (PORCINE) 5000 UNIT/ML IJ SOLN
5000.0000 [IU] | Freq: Three times a day (TID) | INTRAMUSCULAR | Status: DC
  Administered 2023-05-18: 5000 [IU] via SUBCUTANEOUS
  Filled 2023-05-18: qty 1

## 2023-05-18 MED ORDER — PRISMASOL BGK 4/2.5 32-4-2.5 MEQ/L EC SOLN: Status: DC

## 2023-05-18 MED ORDER — DEXTROSE 50 % IV SOLN
1.0000 | Freq: Once | INTRAVENOUS | Status: AC
  Administered 2023-05-18: 50 mL via INTRAVENOUS

## 2023-05-18 MED ORDER — SODIUM CHLORIDE 0.9% FLUSH: 10.0000 mL | INTRAVENOUS | Status: DC | PRN

## 2023-05-18 MED ORDER — PIPERACILLIN-TAZOBACTAM 3.375 G IVPB 30 MIN
3.3750 g | Freq: Four times a day (QID) | INTRAVENOUS | Status: DC
  Filled 2023-05-18 (×2): qty 50

## 2023-05-18 MED ORDER — SODIUM BICARBONATE 8.4 % IV SOLN
100.0000 meq | Freq: Once | INTRAVENOUS | Status: AC
  Administered 2023-05-18: 100 meq via INTRAVENOUS

## 2023-05-18 MED ORDER — NALOXONE HCL 2 MG/2ML IJ SOSY
2.0000 mg | PREFILLED_SYRINGE | Freq: Once | INTRAMUSCULAR | Status: AC
Start: 2023-05-18 — End: 2023-05-18
  Administered 2023-05-18: 2 mg via INTRAVENOUS

## 2023-05-18 MED ORDER — VASOPRESSIN 20 UNITS/100 ML INFUSION FOR SHOCK
0.0400 [IU]/min | INTRAVENOUS | Status: DC
  Administered 2023-05-18 (×2): 0.04 [IU]/min via INTRAVENOUS
  Filled 2023-05-18 (×2): qty 100

## 2023-05-18 MED ORDER — SODIUM BICARBONATE 8.4 % IV SOLN
100.0000 meq | Freq: Once | INTRAVENOUS | Status: AC
  Administered 2023-05-18: 100 meq via INTRAVENOUS
  Filled 2023-05-18: qty 50

## 2023-05-18 MED ORDER — HYDROCORTISONE SOD SUC (PF) 100 MG IJ SOLR
100.0000 mg | Freq: Three times a day (TID) | INTRAMUSCULAR | Status: DC
  Administered 2023-05-18 (×2): 100 mg via INTRAVENOUS
  Filled 2023-05-18 (×2): qty 2

## 2023-05-18 MED ORDER — PANTOPRAZOLE SODIUM 40 MG IV SOLR: 40.0000 mg | Freq: Every day | INTRAVENOUS | Status: DC

## 2023-05-18 MED ORDER — CALCIUM GLUCONATE-NACL 2-0.675 GM/100ML-% IV SOLN
2.0000 g | Freq: Once | INTRAVENOUS | Status: AC
  Administered 2023-05-18: 2000 mg via INTRAVENOUS
  Filled 2023-05-18: qty 100

## 2023-05-18 MED ORDER — SODIUM CHLORIDE 0.9% FLUSH
10.0000 mL | Freq: Two times a day (BID) | INTRAVENOUS | Status: DC
  Administered 2023-05-18: 10 mL

## 2023-05-18 MED ORDER — POLYETHYLENE GLYCOL 3350 17 G PO PACK: 17.0000 g | PACK | Freq: Every day | ORAL | Status: DC | PRN

## 2023-05-18 MED ORDER — SODIUM CHLORIDE 0.9 % IV SOLN
2.0000 g | Freq: Once | INTRAVENOUS | Status: DC
  Administered 2023-05-18: 2 g via INTRAVENOUS
  Filled 2023-05-18: qty 12.5

## 2023-05-18 MED ORDER — DEXTROSE 50 % IV SOLN: 1.0000 | Freq: Once | INTRAVENOUS | Status: AC

## 2023-05-18 MED ORDER — INSULIN ASPART 100 UNIT/ML IJ SOLN: 0.0000 [IU] | INTRAMUSCULAR | Status: DC

## 2023-05-18 MED ORDER — VANCOMYCIN HCL IN DEXTROSE 1-5 GM/200ML-% IV SOLN: 1000.0000 mg | INTRAVENOUS | Status: DC

## 2023-05-18 MED ORDER — EPINEPHRINE HCL 5 MG/250ML IV SOLN IN NS
INTRAVENOUS | Status: AC
  Filled 2023-05-18: qty 250

## 2023-05-18 MED ORDER — SODIUM CHLORIDE 0.9 % FOR CRRT: INTRAVENOUS_CENTRAL | Status: DC | PRN

## 2023-05-18 MED ORDER — NOREPINEPHRINE 16 MG/250ML-% IV SOLN
0.0000 ug/min | INTRAVENOUS | Status: DC
Start: 2023-05-18 — End: 2023-05-19
  Administered 2023-05-18: 40 ug/min via INTRAVENOUS
  Filled 2023-05-18: qty 250

## 2023-05-18 MED ORDER — NOREPINEPHRINE 4 MG/250ML-% IV SOLN
0.0000 ug/min | INTRAVENOUS | Status: DC
  Administered 2023-05-18: 5 ug/min via INTRAVENOUS
  Administered 2023-05-18: 12 ug/min via INTRAVENOUS
  Administered 2023-05-18: 40 ug/min via INTRAVENOUS
  Administered 2023-05-18: 7 ug/min via INTRAVENOUS
  Filled 2023-05-18 (×3): qty 250

## 2023-05-18 MED ORDER — FENTANYL CITRATE PF 50 MCG/ML IJ SOSY: 25.0000 ug | PREFILLED_SYRINGE | INTRAMUSCULAR | Status: DC | PRN

## 2023-05-18 MED ORDER — ORAL CARE MOUTH RINSE
15.0000 mL | OROMUCOSAL | Status: DC
Start: 2023-05-18 — End: 2023-05-19
  Administered 2023-05-18 (×4): 15 mL via OROMUCOSAL

## 2023-05-18 MED ORDER — MAGNESIUM SULFATE 2 GM/50ML IV SOLN: 2.0000 g | Freq: Once | INTRAVENOUS | Status: DC

## 2023-05-18 MED ORDER — METHYLENE BLUE (ANTIDOTE) 1 % IV SOLN
2.0000 mg/kg | Freq: Once | Status: AC
  Administered 2023-05-18: 170 mg via INTRAVENOUS
  Filled 2023-05-18: qty 17

## 2023-05-18 MED ORDER — DOCUSATE SODIUM 50 MG/5ML PO LIQD
100.0000 mg | Freq: Two times a day (BID) | ORAL | Status: DC
  Administered 2023-05-18: 100 mg
  Filled 2023-05-18: qty 10

## 2023-05-18 MED ORDER — POTASSIUM CHLORIDE 10 MEQ/100ML IV SOLN
10.0000 meq | INTRAVENOUS | Status: DC
  Administered 2023-05-18 (×2): 10 meq via INTRAVENOUS
  Filled 2023-05-18: qty 100

## 2023-05-18 MED ORDER — SODIUM BICARBONATE 8.4 % IV SOLN
INTRAVENOUS | Status: AC
  Filled 2023-05-18: qty 100

## 2023-05-18 MED ORDER — OSELTAMIVIR PHOSPHATE 30 MG PO CAPS
30.0000 mg | ORAL_CAPSULE | Freq: Every day | ORAL | Status: DC
  Administered 2023-05-18: 30 mg
  Filled 2023-05-18: qty 1

## 2023-05-18 MED ORDER — VANCOMYCIN HCL 1500 MG/300ML IV SOLN
1500.0000 mg | Freq: Once | INTRAVENOUS | Status: AC
  Administered 2023-05-18: 1500 mg via INTRAVENOUS
  Filled 2023-05-18: qty 300

## 2023-05-18 MED ORDER — POLYETHYLENE GLYCOL 3350 17 G PO PACK
17.0000 g | PACK | Freq: Every day | ORAL | Status: DC
  Administered 2023-05-18: 17 g
  Filled 2023-05-18: qty 1

## 2023-05-18 MED ORDER — LACTATED RINGERS IV BOLUS
1000.0000 mL | Freq: Once | INTRAVENOUS | Status: AC
  Administered 2023-05-18: 1000 mL via INTRAVENOUS

## 2023-05-18 MED ORDER — PIPERACILLIN-TAZOBACTAM IN DEX 2-0.25 GM/50ML IV SOLN
2.2500 g | Freq: Three times a day (TID) | INTRAVENOUS | Status: DC
  Administered 2023-05-18: 2.25 g via INTRAVENOUS
  Filled 2023-05-18 (×3): qty 50

## 2023-05-18 MED ORDER — DEXTROSE 5 % IV SOLN
12.5000 mg/kg/h | INTRAVENOUS | Status: AC
  Administered 2023-05-18: 12.5 mg/kg/h via INTRAVENOUS
  Filled 2023-05-18: qty 90

## 2023-05-18 MED ORDER — ONDANSETRON HCL 4 MG/2ML IJ SOLN: 4.0000 mg | Freq: Four times a day (QID) | INTRAMUSCULAR | Status: DC | PRN

## 2023-05-18 MED ORDER — SODIUM BICARBONATE 8.4 % IV SOLN: INTRAVENOUS | Status: DC

## 2023-05-18 MED ORDER — DOCUSATE SODIUM 100 MG PO CAPS: 100.0000 mg | ORAL_CAPSULE | Freq: Two times a day (BID) | ORAL | Status: DC | PRN

## 2023-05-18 MED ORDER — OSELTAMIVIR PHOSPHATE 75 MG PO CAPS: 75.0000 mg | ORAL_CAPSULE | Freq: Every day | ORAL | Status: DC

## 2023-05-18 MED ORDER — ACETYLCYSTEINE LOAD VIA INFUSION
150.0000 mg/kg | Freq: Once | INTRAVENOUS | Status: AC
  Administered 2023-05-18: 12750 mg via INTRAVENOUS
  Filled 2023-05-18: qty 418

## 2023-05-18 MED ORDER — CHLORHEXIDINE GLUCONATE CLOTH 2 % EX PADS
6.0000 | MEDICATED_PAD | Freq: Every day | CUTANEOUS | Status: DC
Start: 2023-05-18 — End: 2023-05-19
  Administered 2023-05-18: 6 via TOPICAL

## 2023-05-18 MED ORDER — METHYLENE BLUE (ANTIDOTE) 1 % IV SOLN
2.0000 mg/kg | Freq: Once | Status: DC
  Filled 2023-05-18: qty 17

## 2023-05-18 MED ORDER — EPINEPHRINE HCL 5 MG/250ML IV SOLN IN NS
0.5000 ug/min | INTRAVENOUS | Status: DC
  Administered 2023-05-18: 0.5 ug/min via INTRAVENOUS

## 2023-05-18 MED ORDER — VANCOMYCIN VARIABLE DOSE PER UNSTABLE RENAL FUNCTION (PHARMACIST DOSING): Status: DC

## 2023-05-18 MED ORDER — SODIUM BICARBONATE 8.4 % IV SOLN
INTRAVENOUS | Status: AC
  Filled 2023-05-18: qty 50

## 2023-05-18 MED ORDER — PIPERACILLIN-TAZOBACTAM IN DEX 2-0.25 GM/50ML IV SOLN
2.2500 g | Freq: Three times a day (TID) | INTRAVENOUS | Status: DC
  Filled 2023-05-18: qty 50

## 2023-05-18 MED ORDER — STERILE WATER FOR INJECTION IV SOLN
INTRAVENOUS | Status: DC
  Filled 2023-05-18: qty 150
  Filled 2023-05-18 (×2): qty 1000

## 2023-05-18 MED ORDER — SODIUM BICARBONATE 8.4 % IV SOLN
100.0000 meq | Freq: Once | INTRAVENOUS | Status: AC
  Administered 2023-05-18: 100 meq via INTRAVENOUS
  Filled 2023-05-18: qty 100

## 2023-05-18 MED ORDER — HEPARIN SODIUM (PORCINE) 1000 UNIT/ML DIALYSIS: 1000.0000 [IU] | INTRAMUSCULAR | Status: DC | PRN

## 2023-05-20 LAB — CULTURE, BLOOD (ROUTINE X 2): Special Requests: ADEQUATE

## 2023-05-23 LAB — CULTURE, BLOOD (ROUTINE X 2): Culture: NO GROWTH

## 2023-05-30 NOTE — Progress Notes (Signed)
 Trop leak noted almost certainly type II, will check AM and consider heparin in am if keeps rising.  1400 labs stably bad so trying CRRT, family understands not much left to offer after this.

## 2023-05-30 NOTE — Inpatient Diabetes Management (Addendum)
 Inpatient Diabetes Program Recommendations  AACE/ADA: New Consensus Statement on Inpatient Glycemic Control (2015)  Target Ranges:  Prepandial:   less than 140 mg/dL      Peak postprandial:   less than 180 mg/dL (1-2 hours)      Critically ill patients:  140 - 180 mg/dL    Latest Reference Range & Units 05/02/2023 04:32 05/08/2023 04:39 05/11/2023 07:59  Glucose-Capillary 70 - 99 mg/dL 24 (LL) 161 (H) 096 (H)  (LL): Data is critically low (H): Data is abnormally high    Admit Acute encephalopathy Acute respiratory failure w hypercarbia and hypoxia  Multifocal PNA Hemoptysis  Cardiac arrest Shock  Acute renal failure  Acute liver failure Hypoglycemia Cardiac Arrest  No History of Diabetes noted  Current Orders: None    MD- Please consider starting the ICU Glycemic Control order set Either SQ Novolog Q4 hours or IV Insulin Drip      --Will follow patient during hospitalization--  Ambrose Finland RN, MSN, CDCES Diabetes Coordinator Inpatient Glycemic Control Team Team Pager: [phone] (8a-5p)

## 2023-05-30 NOTE — TOC CM/SW Note (Signed)
 Transition of Care [facility]) - Inpatient Brief Assessment   Patient Details  Name: Cynthia Hess MRN: [id number] Date of Birth: [birth year]  [facility] of [facility] CM/SW Contact:    Harriet Masson, RN Phone Number: 05/15/2023, 12:49 PM   Clinical Narrative:  Patient admitted for Multiorgan failure culminating in cardiac arrest.  Transition of Care Asessment:    NCM unable to assess patient due to intubation at this time. Patient not medically stable for discharge.  NCM will continue to follow as patient progresses with care towards discharge.

## 2023-05-30 NOTE — Progress Notes (Signed)
   05/24/2023 1925  Spiritual Encounters  Type of Visit Initial  Care provided to: [facility] partners present during encounter Nurse;Physician  Referral source Code page  Reason for visit Grief/loss  OnCall Visit No   Chaplain responded to a code blue. The patient, Kathyleen, was attended to by the medical team.  I provided support for multiple family members who were distraught over the state of [place].  Support by listening, providing refreshment and hugs. Holding those who were weary with grief.  Checked on family individually for care needs and listened to their stories.   I returned to check on family, they were quietly grieving and spending the last little bit of time they had with their loved one. Most of the family had left only five individuals were present and were supporting one another well.   I returned at the request of the family for prayer. Kiyanna died and family requested prayer for her and them.   Valerie Roys [facility]  [phone]

## 2023-05-30 NOTE — Procedures (Signed)
 Central Venous Catheter Insertion Procedure Note  Cynthia Hess  [id number]  1955/08/21  Date:05/24/2023  Time:3:36 PM   Provider Performing:Krimson Massmann Merrilee Jansky   Procedure: Insertion of Non-tunneled Central Venous [phone]) with US guidance ([id number])   Indication(s) Hemodialysis  Consent Risks of the procedure as well as the alternatives and risks of each were explained to the patient and/or caregiver.  Consent for the procedure was obtained and is signed in the bedside chart  Anesthesia Topical only with 1% lidocaine   Timeout Verified patient identification, verified procedure, site/side was marked, verified correct patient position, special equipment/implants available, medications/allergies/relevant history reviewed, required imaging and test results available.  Sterile Technique Maximal sterile technique including full sterile barrier drape, hand hygiene, sterile gown, sterile gloves, mask, hair covering, sterile ultrasound probe cover (if used).  Procedure Description Area of catheter insertion was cleaned with chlorhexidine and draped in sterile fashion.  With real-time ultrasound guidance a HD catheter was placed into the right internal jugular vein. Nonpulsatile blood flow and easy flushing noted in all ports.  The catheter was sutured in place and sterile dressing applied.  Complications/Tolerance None; patient tolerated the procedure well. Chest X-ray is ordered to verify placement for internal jugular or subclavian cannulation.   Chest x-ray is not ordered for femoral cannulation.  EBL Minimal  Specimen(s) None

## 2023-05-30 NOTE — Progress Notes (Signed)
 Pharmacy Antibiotic Note  Cynthia Hess is a 68 y.o. female admitted on 05/07/2023 with sepsis with multi-organ failure. S/p cardiac arrest in ED. Pharmacy has been consulted for zosyn and vancomycin dosing.  CRRT started this afternoon.   Plan: Adjust Zosyn to 3.375g IV q6h over 30 min Schedule vancomycin 1000mg  IV q24h starting 24h after CRRT start Adjust oseltamivir to 75mg  PO daily   Height: 5\' 2"  (157.5 cm) Weight: 85 kg (187 lb 6.3 oz) IBW/kg (Calculated) : 50.1  Temp (24hrs), Avg:97.6 F (36.4 C), Min:97.1 F (36.2 C), Max:97.9 F (36.6 C)  Recent Labs  Lab 05/13/2023 0437 05/06/2023 0452 05/15/2023 0455 05/23/2023 0705 05/29/2023 0917 05/19/2023 1315 05/05/2023 1532  WBC 6.8  --   --   --   --  QUESTIONABLE RESULTS, RECOMMEND RECOLLECT TO VERIFY 1.2*  CREATININE 5.45*  --  5.10*  --   --  5.80*  --   LATICACIDVEN  --  14.0*  --  >15.0* >9.0* >9.0*  --     Estimated Creatinine Clearance: 9.7 mL/min (A) (by C-G formula based on SCr of 5.8 mg/dL (H)).    Allergies  Allergen Reactions   Latex Itching and Swelling   Other Hives    Black Hair dye, hair loss   Banana Swelling    Antimicrobials this admission: Cefepime 2/17  Vancomycin 2/17> Zosyn 2/17 >  Microbiology results: 2/17 bcx: 2/17 resp panel: 2/17 resp pcr:  2/17 resp cx:   Thank you for allowing pharmacy to be a part of this patient's care.  Fredonia Highland, PharmD, BCPS, [facility] Clinical Pharmacist [phone] Please check AMION for all [facility] Pharmacy numbers 05/29/2023

## 2023-05-30 NOTE — Progress Notes (Signed)
 I responded to a page from the nurse to provide spiritual support for the patient's family. I arrived at the patient's room where two granddaughters were present. I provided spiritual support by reading scripture, sharing words of encouragement, and leading in prayer. I also provided spiritual support for eight family members in the Consult Room.    05/04/2023 0723  Spiritual Encounters  Type of Visit Initial  Care provided to: Pt and family  Conversation partners present during encounter Nurse  Referral source Nurse (RN/NT/LPN)  Reason for visit Urgent spiritual support  OnCall Visit Yes  Interventions  Spiritual Care Interventions Made Established relationship of care and support;Compassionate presence;Prayer;Encouragement    Chaplain Dr Melvyn Novas

## 2023-05-30 NOTE — Progress Notes (Signed)
 Pt transported from ED32 to CT and back with no complications.

## 2023-05-30 NOTE — ED Notes (Signed)
 99 D 50 adm cbg 024 per MD order  0441 0.1 epi adm per MD order 0450 lost pulses 0451 epi adm per MD order  0453 pulse check, zero pulses 0453 D50 adm per MD order  0454 epi adm per MD order 0455 pulses return at 0455  0457 Norepi adm per MD order to start at 5 0459 1 amp bicarb adm per MD order ET tube place 7.5  0501 OG tube placed 14

## 2023-05-30 NOTE — Progress Notes (Signed)
 Pt's yellow ring given to Burna Mortimer pt's daughter.

## 2023-05-30 NOTE — Discharge Summary (Signed)
 Death Summary    Patient details  Name: Cynthia Hess  [id number]  04/14/1955  2023-05-27  1  Date of death: 05-27-2023 Time of Death: 02-Jun-2049   Profile data   Living situation: home Independent with ADLs:  yes Bed bound or WC bound : no Code status  Advanced directives: DNR established during hospitalization   Reason for admission   Initial presentation with altered mental status, hypotension, septic shock, multiorgan failure  Cardiac arrest in ED   Preliminary Cause of death   Multiorgan failure culminating to cardiac arrest   Secondary diagnoses, contributing co-morbidities & complicating factors  PEA Cardiac arrest  Septic Shock  Acute hypoxic respiratory failure  Influenza pneumonia  Acute renal failure  Multiorgan failure  Anion gap metabolic acidosis  NSTEMI  Shock liver  Hypertension  Hyperlipidemia  Condition on arrival (check all that apply)   Cynthia Hess is a 68 y.o. female admitted from home.  Patient did not have advanced directive prior to presentation.  Critically ill, unstable on arrival to ED  Circulatory shock  Respiratory failure  Cardiac arrest  Severe metabolic derangement  Underlying multiple chronic comorbidities    Hospital course   68 year old female with past medical history of HTN, HLD, presented to the emergency department on 05-27-23 with respiratory distress and altered mental status. Symptoms ongoing for 5 days along with decreased PO intake and weakness. Patient had room air sat of 70s with EMS as well as hypotension, hypoglycemia. Shortly after arrival to the emergency department patient became bradycardic, lost pulses and ACLS was started immediately with ROSC achieved after 2 rounds, bicarb and dextrose. She was started on vasopressors, intubated and advanced line CVC and a-line were placed. She had significant derangement with initial pH 6.84, bicarb 8, lactic 14, INR 4, renal failure, shock liver. She was started on broad spectrum  antibiotics, fluid resuscitation and admitted to ICU under CCM service. She had CT showing multifocal pneumonia and respiratory panel +influenza. Due to her renal failure she was also started on CRRT on day of admission.   On evening of admission, patient had repeat PEA cardiac arrest. Again, ROSC was achieved, however patient on 3 vasopressors. CCM attending was present to have GOC discussion with main decision maker. She had poor neurological exam including fixed and dilated pupils, no gag. Family opted to make patient DNR. Patient then decompensated again shortly after with PEA arrest and was allowed to pass.   Cristopher Peru, PA-C [facility] 05/21/23 3:42 PM  Please see [email or web address] for pager details.  From 7A-7P if no response, please call [phone] After hours, please call ELink [phone]

## 2023-05-30 NOTE — Progress Notes (Signed)
 PHARMACY - PHYSICIAN COMMUNICATION CRITICAL VALUE ALERT - BLOOD CULTURE IDENTIFICATION (BCID)  Cynthia Hess is an 68 y.o. female who presented to [facility] on 05/15/2023 with a chief complaint of shock  Assessment:  no changes planned (include suspected source if known)  Name of physician (or Provider) Contacted: Gaynell Face CCM  Current antibiotics: vanco + zosyn  Changes to prescribed antibiotics recommended:  Recommendations declined by provider due to clinical deterioration  Results for orders placed or performed during the hospital encounter of 05/19/2023  Blood Culture ID Panel (Reflexed) (Collected: 05/22/2023  4:40 AM)  Result Value Ref Range   Enterococcus faecalis NOT DETECTED NOT DETECTED   Enterococcus Faecium NOT DETECTED NOT DETECTED   Listeria monocytogenes NOT DETECTED NOT DETECTED   Staphylococcus species DETECTED (A) NOT DETECTED   Staphylococcus aureus (BCID) DETECTED (A) NOT DETECTED   Staphylococcus epidermidis NOT DETECTED NOT DETECTED   Staphylococcus lugdunensis NOT DETECTED NOT DETECTED   Streptococcus species NOT DETECTED NOT DETECTED   Streptococcus agalactiae NOT DETECTED NOT DETECTED   Streptococcus pneumoniae NOT DETECTED NOT DETECTED   Streptococcus pyogenes NOT DETECTED NOT DETECTED   A.calcoaceticus-baumannii NOT DETECTED NOT DETECTED   Bacteroides fragilis NOT DETECTED NOT DETECTED   Enterobacterales NOT DETECTED NOT DETECTED   Enterobacter cloacae complex NOT DETECTED NOT DETECTED   Escherichia coli NOT DETECTED NOT DETECTED   Klebsiella aerogenes NOT DETECTED NOT DETECTED   Klebsiella oxytoca NOT DETECTED NOT DETECTED   Klebsiella pneumoniae NOT DETECTED NOT DETECTED   Proteus species NOT DETECTED NOT DETECTED   Salmonella species NOT DETECTED NOT DETECTED   Serratia marcescens NOT DETECTED NOT DETECTED   Haemophilus influenzae NOT DETECTED NOT DETECTED   Neisseria meningitidis NOT DETECTED NOT DETECTED   Pseudomonas aeruginosa NOT DETECTED NOT  DETECTED   Stenotrophomonas maltophilia NOT DETECTED NOT DETECTED   Candida albicans NOT DETECTED NOT DETECTED   Candida auris NOT DETECTED NOT DETECTED   Candida glabrata NOT DETECTED NOT DETECTED   Candida krusei NOT DETECTED NOT DETECTED   Candida parapsilosis NOT DETECTED NOT DETECTED   Candida tropicalis NOT DETECTED NOT DETECTED   Cryptococcus neoformans/gattii NOT DETECTED NOT DETECTED   Meth resistant mecA/C and MREJ NOT DETECTED NOT DETECTED    [name] 05/12/2023  8:01 PM

## 2023-05-30 NOTE — Progress Notes (Signed)
 Patient PEA arrest at 1917, epi x2, 1 amp of bicarb, calcium gluconate given. ROSC achieved at 1923. Large family at bedside before and during the code. Code team arrived as well as CCM at bedside. Labs sent, CBG gotten, glucose reading "low", 1 amp of dextrose given, recheck 405.  Blood pressure steadily deceasing. Family at bedside and aware that patient is on complete support and there is no other medication to be given for blood pressure. Family extensively educated on all the interventions. Patients heart rate with frequent pauses. Family aware it may not be long before she passes. More family to bedside. TOD 2051. No heart tones or lung sounds auscultated. Chaplain at bedside with Burna Mortimer and other family.

## 2023-05-30 NOTE — Progress Notes (Signed)
 RT assisted wiith transport of this patient from ED to 35M while on full vent. Support. No complications 35M RT given report.

## 2023-05-30 NOTE — Progress Notes (Signed)
 ETT pulled back 2 cm per MD.  ETT secured at 23 at the lips

## 2023-05-30 NOTE — ED Provider Notes (Signed)
 [place] EMERGENCY DEPARTMENT AT [facility] Provider Note  CSN: [id number] Arrival date & time: 05/10/2023 0425  Chief Complaint(s) Respiratory Distress  HPI Cynthia Hess is a 68 y.o. female with PMH HTN, HLD, herniated lumbar disks status post spinal fusion who presents emergency department for evaluation of altered mental status and respiratory distress.  History obtained from EMS and patient's family who states that over the last 5 days she has had upper respiratory symptoms and has been primarily bedbound and weak taking very little p.o.  Family states they have been trying to encourage her to go to the hospital because she has been breathing quickly and been short of breath but patient stopped responding to them and they called EMS today.  On EMS arrival, patient with sats in the 70s, hypotensive and hypoglycemic with initial blood sugars in the 60s.  On arrival, patient is altered not responding to commands on a nonrebreather.  Additional history unable to be obtained as patient is critically ill and altered.   Past Medical History Past Medical History:  Diagnosis Date   Allergic rhinitis 10/03/2016   Arthritis    in low back   Asthma 2009   CLOSED FRACTURE OF METATARSAL BONE 12/29/2008   Annotation: minimally displaced fracture through the fifth metatarsal Qualifier: Diagnosis of  By: Daphine Deutscher FNP, [employer]     COSTOCHONDRITIS 09/04/2008   Qualifier: Diagnosis of  By: Daphine Deutscher FNP, Fernande Boyden Quervain's tenosynovitis, left 10/03/2016   DEGENERATIVE DISC DISEASE, CERVICAL SPINE 10/18/2007   Qualifier: Diagnosis of  By: Daphine Deutscher FNP, [facility]     DENTAL CARIES 02/04/2008   Qualifier: Diagnosis of  By: Daphine Deutscher FNP, [employer]     Depression    meds in the past/ denies 06/16/13   HERNIATED LUMBOSACRAL DISC 05/02/2008   Qualifier: Diagnosis of  By: Shannon, [place]     History of kidney stones    Hyperlipidemia 2013   Hypertension 2009   Left hip pain 11/02/2013    Lumbosacral spondylosis without myelopathy 10/03/2011   Microscopic hematuria 04/22/2007   Qualifier: Diagnosis of  By: [name]     Mood changes 11/18/2018   NEPHROLITHIASIS 05/21/2007   Annotation: bilateral Qualifier: Diagnosis of  By: Daphine Deutscher FNP, [employer]     Neuropathy, cervical (radicular) 02/03/2018   Obesity (BMI 30-39.9) 06/03/2013   Osteoarthritis of spine 02/09/2008   Qualifier: Diagnosis of  By: Daphine Deutscher FNP, [employer]     PAIN IN SOFT TISSUES OF LIMB 04/15/2007   Qualifier: Diagnosis of  By: Daphine Deutscher FNP, [employer]     Palpitations 11/18/2018   Primary hypertension 09/01/2013   Recurrent incisional hernia with incarceration s/p lap repair w mesh 06/24/2013 05/21/2007   Qualifier: Diagnosis of  By: Daphine Deutscher FNP, [employer]     S/P hernia repair 06/24/2013   S/P lumbar spinal fusion 09/29/2014   Sciatica 10/03/2011   SUBACROMIAL BURSITIS, LEFT 10/30/2007   Annotation: per MRI Qualifier: Diagnosis of  By: Daphine Deutscher FNP, [employer]     Tobacco use disorder 06/03/2013   TRANSAMINASES, SERUM, ELEVATED 02/19/2010   Qualifier: Diagnosis of  By: Daphine Deutscher FNP, Zena Amos     TRICHOMONIASIS 04/22/2007   Qualifier: History of  By: Daphine Deutscher FNP, [employer]     Vaginal dryness 05/19/2016   WRIST PAIN, BILATERAL 01/09/2010   Qualifier: Diagnosis of  By: Daphine Deutscher FNP, Zena Amos     Patient Active Problem List   Diagnosis Date Noted   Primary osteoarthritis of left hip 06/20/2022   Osteoarthritis of left  hip 06/20/2022   Allergic dermatitis 04/03/2020   Dyspepsia 11/10/2019   Chronic back pain 11/10/2019   Hyperlipidemia 11/10/2019   Palpitations 11/18/2018   Mood changes 11/18/2018   Neuropathy, cervical (radicular) 02/03/2018   Allergic rhinitis 10/03/2016   Vaginal dryness 05/19/2016   S/P lumbar spinal fusion 09/29/2014   Primary hypertension 09/01/2013   S/P hernia repair 06/24/2013   Obesity (BMI 30-39.9) 06/03/2013   Tobacco use disorder 06/03/2013   Lumbosacral spondylosis without  myelopathy 10/03/2011   TRANSAMINASES, SERUM, ELEVATED 02/19/2010   Osteoarthritis of spine 02/09/2008   SUBACROMIAL BURSITIS, LEFT 10/30/2007   DEGENERATIVE DISC DISEASE, CERVICAL SPINE 10/18/2007   Recurrent incisional hernia with incarceration s/p lap repair w mesh 06/24/2013 05/21/2007   MICROSCOPIC HEMATURIA 04/22/2007   Home Medication(s) Prior to Admission medications   Medication Sig Start Date End Date Taking? Authorizing Provider  acetaminophen (TYLENOL) 500 MG tablet Take 2 tablets (1,000 mg total) by mouth every 6 (six) hours as needed for moderate pain. 06/20/22   Cecil Cobbs, PA-C  amLODipine (NORVASC) 5 MG tablet Take 5 mg by mouth daily.    [provider]  atorvastatin (LIPITOR) 40 MG tablet TAKE 1 TABLET(40 MG) BY MOUTH DAILY 06/30/22   Idalia Needle, Turkey J, DO  fluticasone (FLONASE) 50 MCG/ACT nasal spray SHAKE LIQUID AND USE 2 SPRAYS IN [facility] NOSTRIL DAILY 02/05/22   Paige, Turkey J, DO  lisinopril (ZESTRIL) 40 MG tablet TAKE 1 TABLET(40 MG) BY MOUTH DAILY 03/19/22   Cora Collum, DO                                                                                                                                    Past Surgical History Past Surgical History:  Procedure Laterality Date   ABDOMINAL HYSTERECTOMY  04/01/1987   still gets pap smears   BLADDER REPAIR  04/01/1987   ?bladder injury   DILATION AND CURETTAGE OF UTERUS     HERNIA REPAIR  1990s   reports total of 3 surgeries, most recently 2007   INSERTION OF MESH N/A 06/24/2013   Procedure: INSERTION OF MESH;  Surgeon: Ardeth Sportsman, MD;  Location: [facility];  Service: General;  Laterality: N/A;   MAXIMUM ACCESS (MAS)POSTERIOR LUMBAR INTERBODY FUSION (PLIF) 2 LEVEL N/A 09/29/2014   Procedure: FOR MAXIMUM ACCESS (MAS) POSTERIOR LUMBAR INTERBODY FUSION  LUMBAR FOUR-FIVE,LUMBAR FIVE-SACRAL ONE;  Surgeon: Tia Alert, MD;  Location: [facility];  Service: Neurosurgery;  Laterality: N/A;   MULTIPLE  TOOTH EXTRACTIONS     SMALL INTESTINE SURGERY  04/01/1987   for "blockage"   TONSILLECTOMY  03/31/1965   TOTAL HIP ARTHROPLASTY Left 06/20/2022   Procedure: TOTAL HIP ARTHROPLASTY;  Surgeon: Joen Laura, MD;  Location: [facility];  Service: Orthopedics;  Laterality: Left;   TUBAL LIGATION  03/31/1977   UMBILICAL HERNIA REPAIR  04/01/2003   open w suture   VENTRAL HERNIA REPAIR  03/31/2005   open w mesh Dr Zachery Dakins   VENTRAL HERNIA REPAIR N/A 06/24/2013   Procedure: LAPAROSCOPIC VENTRAL WALL HERNIA REPAIR;  Surgeon: Ardeth Sportsman, MD;  Location: [facility];  Service: General;  Laterality: N/A;   Family History Family History  Problem Relation Age of Onset   Early death Mother        when pt was 36; poisoned   Early death Father        when pt was 40, unknown    Asthma Brother    Diabetes Brother    Heart murmur Brother    Hypertension Brother    Alzheimer's disease Maternal Grandmother    Hypertension Brother     Social History Social History   Tobacco Use   Smoking status: Some Days    Current packs/day: 1.00    Average packs/day: 1 pack/day for 25.0 years (25.0 ttl pk-yrs)    Types: Cigarettes   Smokeless tobacco: Never  Vaping Use   Vaping status: Never Used  Substance Use Topics   Alcohol use: Yes    Alcohol/week: 3.0 standard drinks of alcohol    Types: 3 Cans of beer per week    Comment: occasional   Drug use: Never   Allergies Latex, Other, and Banana  Review of Systems Review of Systems  Unable to perform ROS: Mental status change    Physical Exam Vital Signs  I have reviewed the triage vital signs Ht 5\' 2"  (1.575 m)   BMI 32.19 kg/m   Physical Exam Constitutional:      Appearance: She is ill-appearing and toxic-appearing.  HENT:     Head: Normocephalic and atraumatic.  Eyes:     General:        Right eye: No discharge.        Left eye: No discharge.  Cardiovascular:     Rate and Rhythm: Normal rate and regular rhythm.  Pulmonary:      Effort: Respiratory distress present.  Abdominal:     General: There is no distension.     Palpations: There is no mass.  Musculoskeletal:        General: No swelling.  Skin:    Coloration: Skin is not jaundiced.     Findings: No rash.  Neurological:     Mental Status: She is disoriented.     ED Results and Treatments Labs (all labs ordered are listed, but only abnormal results are displayed) Labs Reviewed  CBC WITH DIFFERENTIAL/PLATELET - Abnormal; Notable for the following components:      Result Value   RBC 3.71 (*)    MCV 106.2 (*)    nRBC 1.2 (*)    nRBC 3 (*)    All other components within normal limits  PROTIME-INR - Abnormal; Notable for the following components:   Prothrombin Time 39.5 (*)    INR 4.0 (*)    All other components within normal limits  APTT - Abnormal; Notable for the following components:   aPTT 40 (*)    All other components within normal limits  I-STAT CHEM 8, ED - Abnormal; Notable for the following components:   Sodium 134 (*)    Potassium 3.4 (*)    BUN 47 (*)    Creatinine, Ser 5.10 (*)    Glucose, Bld 417 (*)    Calcium, Ion 0.72 (*)    TCO2 11 (*)    All other components within normal limits  I-STAT VENOUS BLOOD GAS, ED - Abnormal; Notable for the  following components:   pH, Ven 6.839 (*)    pO2, Ven 96 (*)    Bicarbonate 8.3 (*)    TCO2 10 (*)    Acid-base deficit 25.0 (*)    Potassium 3.4 (*)    Calcium, Ion 0.74 (*)    All other components within normal limits  I-STAT CG4 LACTIC ACID, ED - Abnormal; Notable for the following components:   Lactic Acid, Venous 14.0 (*)    All other components within normal limits  CBG MONITORING, ED - Abnormal; Notable for the following components:   Glucose-Capillary 24 (*)    All other components within normal limits  CBG MONITORING, ED - Abnormal; Notable for the following components:   Glucose-Capillary 266 (*)    All other components within normal limits  I-STAT ARTERIAL BLOOD GAS, ED -  Abnormal; Notable for the following components:   pH, Arterial 6.813 (*)    pCO2 arterial 50.8 (*)    pO2, Arterial 375 (*)    Bicarbonate 8.2 (*)    TCO2 10 (*)    Acid-base deficit 26.0 (*)    Calcium, Ion 0.78 (*)    HCT 32.0 (*)    Hemoglobin 10.9 (*)    All other components within normal limits  CULTURE, BLOOD (ROUTINE X 2)  CULTURE, BLOOD (ROUTINE X 2)  RESP PANEL BY RT-PCR (RSV, FLU A&B, COVID)  RVPGX2  COMPREHENSIVE METABOLIC PANEL  URINALYSIS, W/ REFLEX TO CULTURE (INFECTION SUSPECTED)                                                                                                                          Radiology DG Chest Port 1 View Result Date: 05/11/2023 CLINICAL DATA:  Sepsis. Endotracheal tube placement. Ventilator dependence. EXAM: PORTABLE CHEST 1 VIEW COMPARISON:  09/20/2014 FINDINGS: Cardiopericardial silhouette is at upper limits of normal for size. Endotracheal tube tip is 1.9 cm above the base of the carina. Patchy areas of airspace opacity are seen in the left mid and lower lung and peripheral right base. No overt pulmonary edema or appreciable pleural effusion. The NG tube passes into the stomach although the distal tip position is not included on the film. No acute bony abnormality. Telemetry leads overlie the chest. IMPRESSION: 1. Endotracheal tube tip is 1.9 cm above the base of the carina. 2. Patchy areas of airspace opacity in the left mid and lower lung and peripheral right base. Imaging features are compatible with multifocal pneumonia. Electronically Signed   By: Kennith Center M.D.   On: 05/23/2023 05:56   CT Head Wo Contrast Result Date: 05/06/2023 CLINICAL DATA:  Delirium, CPR EXAM: CT HEAD WITHOUT CONTRAST TECHNIQUE: Contiguous axial images were obtained from the base of the skull through the vertex without intravenous contrast. RADIATION DOSE REDUCTION: This exam was performed according to the departmental dose-optimization program which includes automated  exposure control, adjustment of the mA and/or kV according to patient size and/or use of iterative reconstruction technique. COMPARISON:  None Available. FINDINGS: Brain: Motion artifact. No convincing infarct, including in the left occipital parietal region, when accounting for the degree of artifact with streak. No hemorrhage, hydrocephalus, masslike finding, or collection. Vascular: No hyperdense vessel or unexpected calcification. Skull: Normal. Negative for fracture or focal lesion. Sinuses/Orbits: Negative IMPRESSION: 1. No definite acute finding. 2. Low-density areas only seen at levels of motion artifact, follow-up could be obtained if persisting neurologic concern. Electronically Signed   By: Tiburcio Pea M.D.   On: 05/07/2023 05:55   DG Abd 1 View Result Date: 05/17/2023 CLINICAL DATA:  Encounter for orogastric tube placement EXAM: ABDOMEN - 1 VIEW COMPARISON:  None Available. FINDINGS: Enteric tube with tip and side port at the stomach. Normal bowel gas pattern. Indistinct opacity at the bases, there is pending chest radiograph. Lumbar fusion hardware. IMPRESSION: Enteric tube with tip at the stomach. Electronically Signed   By: Tiburcio Pea M.D.   On: 05/22/2023 05:52    Pertinent labs & imaging results that were available during my care of the patient were reviewed by me and considered in my medical decision making (see MDM for details).  Medications Ordered in ED Medications  dextrose 50 % solution 50 mL (has no administration in time range)  dextrose 50 % solution (has no administration in time range)  lactated ringers bolus 1,000 mL (has no administration in time range)  naloxone (NARCAN) injection 2 mg (has no administration in time range)  polyethylene glycol (MIRALAX / GLYCOLAX) packet 17 g (has no administration in time range)  fentaNYL (SUBLIMAZE) injection 25 mcg (has no administration in time range)  fentaNYL (SUBLIMAZE) injection 25-100 mcg (has no administration in time  range)  docusate (COLACE) 50 MG/5ML liquid 100 mg (has no administration in time range)  sodium bicarbonate 150 mEq in sterile water 1,150 mL infusion ( Intravenous New Bag/Given 05/12/2023 0556)  calcium gluconate 1 g/ 50 mL sodium chloride IVPB (has no administration in time range)  vancomycin (VANCOREADY) IVPB 1500 mg/300 mL (has no administration in time range)  ceFEPIme (MAXIPIME) 2 g in sodium chloride 0.9 % 100 mL IVPB (has no administration in time range)                                                                                                                                     Procedures .Critical Care  Performed by: Glendora Score, MD Authorized by: Glendora Score, MD   Critical care provider statement:    Critical care time (minutes):  90   Critical care was necessary to treat or prevent imminent or life-threatening deterioration of the following conditions:  Respiratory failure and cardiac failure   Critical care was time spent personally by me on the following activities:  Development of treatment plan with patient or surrogate, discussions with consultants, evaluation of patient's response to treatment, examination of patient, ordering and review of laboratory studies, ordering and review of radiographic studies,  ordering and performing treatments and interventions, pulse oximetry, re-evaluation of patient's condition and review of old charts Procedure Name: Intubation Date/Time: 05/06/2023 6:07 AM  Performed by: Glendora Score, MDPre-anesthesia Checklist: Patient identified, Patient being monitored, Emergency Drugs available, Timeout performed and Suction available Oxygen Delivery Method: Non-rebreather mask Preoxygenation: Pre-oxygenation with 100% oxygen Ventilation: Mask ventilation without difficulty Laryngoscope Size: Glidescope and 3 Grade View: Grade I Tube size: 7.5 mm Number of attempts: 1 Airway Equipment and Method: Video-laryngoscopy Placement  Confirmation: ETT inserted through vocal cords under direct vision, CO2 detector and Breath sounds checked- equal and bilateral Dental Injury: Bloody posterior oropharynx  Difficulty Due To: Difficulty was anticipated    ARTERIAL LINE  Date/Time: 05/28/2023 6:07 AM  Performed by: Glendora Score, MD Authorized by: Glendora Score, MD   Consent:    Consent obtained:  Emergent situation Indications:    Indications: hemodynamic monitoring and multiple ABGs   Pre-procedure details:    Skin preparation:  Chlorhexidine Sedation:    Sedation type:  None Anesthesia:    Anesthesia method:  None Procedure details:    Location:  R femoral   Placement technique:  Seldinger   Number of attempts:  1   Transducer: waveform confirmed   Post-procedure details:    Post-procedure:  Sterile dressing applied   CMS:  Normal   Procedure completion:  Tolerated well, no immediate complications Central Line  Date/Time: 05/06/2023 6:08 AM  Performed by: Glendora Score, MD Authorized by: Glendora Score, MD   Consent:    Consent obtained:  Emergent situation Sedation:    Sedation type:  None Anesthesia:    Anesthesia method:  None Procedure details:    Location:  L femoral   Procedural supplies:  Triple lumen   Ultrasound guidance: yes     Ultrasound guidance timing: real time     Number of attempts:  1   Successful placement: yes   Post-procedure details:    Post-procedure:  Line sutured and dressing applied   Assessment:  Blood return through all ports   Procedure completion:  Tolerated well, no immediate complications   (including critical care time)  Medical Decision Making / ED Course   This patient presents to the ED for concern of altered mental status, respiratory stress, this involves an extensive number of treatment options, and is a complaint that carries with it a high risk of complications and morbidity.  The differential diagnosis includes infection, metabolic/toxic  encephalopathy, hypoglycemia, malperfusion, hypoxia, trauma or other intracranial process  MDM: Patient seen emergency room for evaluation of respiratory distress and altered mental status.  Physical exam reveals a toxic appearing patient, not responsive to commands with tachypnea but pulses are palpable on arrival.  Initial POC blood glucose is 24 and patient immediately given 1 amp of D50.  Initially blood pressures were difficult to obtain and obtaining a O2 saturation was exceedingly difficult despite trying multiple sites.  Repeat POC glucose 266. patient started to become bradycardic requiring single dose of push dose epi.  Patient suddenly lost pulses and CPR initiated immediately and patient received 2 rounds of CPR with epinephrine, D50 and bicarb.  Arrest was PEA.  We did eventually get ROSC and patient started on Levophed.  Patient intubated without sedation and patient did not have a gag or react at all to intubation.  Patient's cords were quite swollen and had a copious amount of bloody vomitus in the airway.  Intubation was ultimately successful confirmed by x-ray.  Right sided femoral A-line placed.  Left-sided femoral CVC placed.  Blood work obtained prior to patient going into cardiac arrest is highly concerning with an initial pH of 6.84, bicarb 8.3, ionized calcium 0.74, lactic 14, INR 4.0.  Patient received aggressive fluid resuscitation, broad-spectrum antibiotics, calcium gluconate, bicarb drip.  Patient stabilized on Levophed and fluid resuscitation with bicarb drip.  Brought to the CAT scanner and initial CT head without evidence of head bleed.  Spoke to the ICU who will come to evaluate the patient and admit.  Additional history obtained: -Additional history obtained from multiple family members -External records from outside source obtained and reviewed including: Chart review including previous notes, labs, imaging, consultation notes   Lab Tests: -I ordered, reviewed, and  interpreted labs.   The pertinent results include:   Labs Reviewed  CBC WITH DIFFERENTIAL/PLATELET - Abnormal; Notable for the following components:      Result Value   RBC 3.71 (*)    MCV 106.2 (*)    nRBC 1.2 (*)    nRBC 3 (*)    All other components within normal limits  PROTIME-INR - Abnormal; Notable for the following components:   Prothrombin Time 39.5 (*)    INR 4.0 (*)    All other components within normal limits  APTT - Abnormal; Notable for the following components:   aPTT 40 (*)    All other components within normal limits  I-STAT CHEM 8, ED - Abnormal; Notable for the following components:   Sodium 134 (*)    Potassium 3.4 (*)    BUN 47 (*)    Creatinine, Ser 5.10 (*)    Glucose, Bld 417 (*)    Calcium, Ion 0.72 (*)    TCO2 11 (*)    All other components within normal limits  I-STAT VENOUS BLOOD GAS, ED - Abnormal; Notable for the following components:   pH, Ven 6.839 (*)    pO2, Ven 96 (*)    Bicarbonate 8.3 (*)    TCO2 10 (*)    Acid-base deficit 25.0 (*)    Potassium 3.4 (*)    Calcium, Ion 0.74 (*)    All other components within normal limits  I-STAT CG4 LACTIC ACID, ED - Abnormal; Notable for the following components:   Lactic Acid, Venous 14.0 (*)    All other components within normal limits  CBG MONITORING, ED - Abnormal; Notable for the following components:   Glucose-Capillary 24 (*)    All other components within normal limits  CBG MONITORING, ED - Abnormal; Notable for the following components:   Glucose-Capillary 266 (*)    All other components within normal limits  I-STAT ARTERIAL BLOOD GAS, ED - Abnormal; Notable for the following components:   pH, Arterial 6.813 (*)    pCO2 arterial 50.8 (*)    pO2, Arterial 375 (*)    Bicarbonate 8.2 (*)    TCO2 10 (*)    Acid-base deficit 26.0 (*)    Calcium, Ion 0.78 (*)    HCT 32.0 (*)    Hemoglobin 10.9 (*)    All other components within normal limits  CULTURE, BLOOD (ROUTINE X 2)  CULTURE, BLOOD  (ROUTINE X 2)  RESP PANEL BY RT-PCR (RSV, FLU A&B, COVID)  RVPGX2  COMPREHENSIVE METABOLIC PANEL  URINALYSIS, W/ REFLEX TO CULTURE (INFECTION SUSPECTED)      Imaging Studies ordered: I ordered imaging studies including chest x-ray, abdominal x-ray, CT head, CT chest abdomen pelvis I independently visualized and interpreted imaging. I agree with the radiologist interpretation  Medicines ordered and prescription drug management: Meds ordered this encounter  Medications   dextrose 50 % solution 50 mL   dextrose 50 % solution    Black, Crystal L: cabinet override   lactated ringers bolus 1,000 mL   naloxone (NARCAN) injection 2 mg   polyethylene glycol (MIRALAX / GLYCOLAX) packet 17 g   fentaNYL (SUBLIMAZE) injection 25 mcg   fentaNYL (SUBLIMAZE) injection 25-100 mcg   docusate (COLACE) 50 MG/5ML liquid 100 mg   sodium bicarbonate 150 mEq in sterile water 1,150 mL infusion    Sodium Bicarbonate 150 mEq added to 1L SWFI = 261 mOsm/L.   DISCONTD: sodium bicarbonate 150 mEq in dextrose 5 % 1,150 mL infusion    Sodium Bicarbonate 150 mEq added to 1L D5W = 480 mOsm/L (effective osmolarity: 261 mOsm/L).   calcium gluconate 1 g/ 50 mL sodium chloride IVPB   vancomycin (VANCOREADY) IVPB 1500 mg/300 mL    Indication::   Sepsis   ceFEPIme (MAXIPIME) 2 g in sodium chloride 0.9 % 100 mL IVPB    Antibiotic Indication::   Sepsis    -I have reviewed the patients home medicines and have made adjustments as needed  Critical interventions CPR, pressor support, fluids, intubation, A-line, CVC  Consultations Obtained: I requested consultation with the intensivist on-call,  and discussed lab and imaging findings as well as pertinent plan - they recommend: ICU admission   Cardiac Monitoring: The patient was maintained on a cardiac monitor.  I personally viewed and interpreted the cardiac monitored which showed an underlying rhythm of: Sinus tachycardia  Social Determinants of Health:  Factors  impacting patients care include: none   Reevaluation: After the interventions noted above, I reevaluated the patient and found that they have :improved  Co morbidities that complicate the patient evaluation  Past Medical History:  Diagnosis Date   Allergic rhinitis 10/03/2016   Arthritis    in low back   Asthma 2009   CLOSED FRACTURE OF METATARSAL BONE 12/29/2008   Annotation: minimally displaced fracture through the fifth metatarsal Qualifier: Diagnosis of  By: Daphine Deutscher FNP, [employer]     COSTOCHONDRITIS 09/04/2008   Qualifier: Diagnosis of  By: Daphine Deutscher FNP, Fernande Boyden Quervain's tenosynovitis, left 10/03/2016   DEGENERATIVE DISC DISEASE, CERVICAL SPINE 10/18/2007   Qualifier: Diagnosis of  By: Daphine Deutscher FNP, [facility]     DENTAL CARIES 02/04/2008   Qualifier: Diagnosis of  By: Daphine Deutscher FNP, [employer]     Depression    meds in the past/ denies 06/16/13   HERNIATED LUMBOSACRAL DISC 05/02/2008   Qualifier: Diagnosis of  By: Shannon, [place]     History of kidney stones    Hyperlipidemia 2013   Hypertension 2009   Left hip pain 11/02/2013   Lumbosacral spondylosis without myelopathy 10/03/2011   Microscopic hematuria 04/22/2007   Qualifier: Diagnosis of  By: [name]     Mood changes 11/18/2018   NEPHROLITHIASIS 05/21/2007   Annotation: bilateral Qualifier: Diagnosis of  By: Daphine Deutscher FNP, Nykedtra     Neuropathy, cervical (radicular) 02/03/2018   Obesity (BMI 30-39.9) 06/03/2013   Osteoarthritis of spine 02/09/2008   Qualifier: Diagnosis of  By: Daphine Deutscher FNP, Nykedtra     PAIN IN SOFT TISSUES OF LIMB 04/15/2007   Qualifier: Diagnosis of  By: Daphine Deutscher FNP, Nykedtra     Palpitations 11/18/2018   Primary hypertension 09/01/2013   Recurrent incisional hernia with incarceration s/p lap repair w mesh 06/24/2013 05/21/2007   Qualifier: Diagnosis of  By: Daphine Deutscher FNP,  Nykedtra     S/P hernia repair 06/24/2013   S/P lumbar spinal fusion 09/29/2014   Sciatica 10/03/2011   SUBACROMIAL  BURSITIS, LEFT 10/30/2007   Annotation: per MRI Qualifier: Diagnosis of  By: Daphine Deutscher FNP, Nykedtra     Tobacco use disorder 06/03/2013   TRANSAMINASES, SERUM, ELEVATED 02/19/2010   Qualifier: Diagnosis of  By: Daphine Deutscher FNP, Zena Amos     TRICHOMONIASIS 04/22/2007   Qualifier: History of  By: Daphine Deutscher FNP, Nykedtra     Vaginal dryness 05/19/2016   WRIST PAIN, BILATERAL 01/09/2010   Qualifier: Diagnosis of  By: Daphine Deutscher FNP, Zena Amos        Dispostion: I considered admission for this patient, and patient will require hospital admission for management of cardiac arrest     Final Clinical Impression(s) / ED Diagnoses Final diagnoses:  Acute respiratory failure with hypoxia [facility])  Cardiac arrest [facility])     @PCDICTATION @    Glendora Score, MD 05/26/2023 [phone]

## 2023-05-30 NOTE — Progress Notes (Signed)
 Cross covering [facility] physician  Called at shift change emergently for code blue on this pt, pea arrest per report. ROSC achieved by time of my arrival however on 3 vasopressors including epi with inotropic effect. Pt already with intubation cvc and aline. NUMEROUS family members present. D/w main decision maker as pt's exam is fixed and dilated pupils, no resp drive and no gag at this time without recent sedation. Crrt has had to be stopped and pt remains on bicarb infusion, she and her supporting family have stated she would not want to exist in this state and will transition her to DNR. I unfortunately anticipate that she will die this evening.    I have changed her code status appropriately after discussion with pt's daughter and supportive family.   It should also be noted that pt's brother did request transfer to another facility for his loved ones care. I did advise him that she is 1. Not stable enough for transfer on 80 norepi, 20 epi and 0.04 vaso as well as bicarb without ability to reinitiate crrt on her. As well as her clinical exam with fixed pupil  exam, unresponsive to painful stim and lack of resp drive (not breathing over vent).   He requested that this be documented in the chart that his request for transfer was asked and I have respectfully declined contact surrounding facilities considering her unstable hemodynamic state and extremely poor neuro exam.    Numerous pharm, RN, RT and family members at bedside during entire code and discussion that I had. Daughter who is decision maker per the family, has also stated DNR with the understand we will not d/c current vasopressors or support. Continue current therapies as able (excluding crrt 2/2 status) and if pt decompensates again will pass peacefully with family at bedside. Family visitation has been relaxed in light of current status.

## 2023-05-30 NOTE — H&P (Addendum)
 NAME:  Cynthia Hess, MRN:  [id number], DOB:  12/03/1955, LOS: 0 ADMISSION DATE:  05/10/2023, CONSULTATION DATE:  05/20/2023 REFERRING MD:  Kommor, CHIEF COMPLAINT:  AMS / cardiac arrest    History of Present Illness:  68 yo F PMH HTN HLD, hip replacement presented to ED 2/17 via EMS for AMS weakness tachypnea. Per family has been sick x 1 week but can't really pinpoint what she has been sick with. For 2d has had a cough, but maybe this has been ongoing x 1 week. Had some vomiting today while she was unable to move, but possibly has had n/v/d x a week. She was hypotensive in ED, hypoxic. Initially hypoglycemic to 24, was corrected. Req pressors. Initial labs were obtained and revealed a pH 6.8 LA 14.  She had a cardiac arrest while remaining labs were in process. ROSC after about 5 min. Has been hyperglycemic since CVC and art line placed in ED emergently   Went for CT H CT chest a/p which shows multifocal PNA, and no certain acute intracranial finding (some low density areas at areas of motion artifact)    PCCM called for admission   Pertinent  Medical History  HTN HLD hip surgery hernia surgery  Significant Hospital Events: Including procedures, antibiotic start and stop dates in addition to other pertinent events   2/17 ED. Code in ED> ICU admit   Interim History / Subjective:  Post arrest 15 NE bicarb gtt   Objective   Blood pressure (!) 95/55, pulse (!) 101, resp. rate (!) 33, height 5\' 2"  (1.575 m), weight 85 kg, SpO2 97%.    Vent Mode: PRVC FiO2 (%):  [100 %] 100 % Set Rate:  [28 bmp-32 bmp] 32 bmp Vt Set:  [400 mL] 400 mL Plateau Pressure:  [16 cmH20] 16 cmH20   Intake/Output Summary (Last 24 hours) at 05/25/2023 1308 Last data filed at 05/17/2023 6578 Gross per 24 hour  Intake 1000 ml  Output --  Net 1000 ml   Filed Weights   05/09/2023 0630  Weight: 85 kg    Examination: General: critically ill F intubat HENT: ETT secure  Lungs: Mechanically ventilated, coarse   Cardiovascular: tachycardic  Abdomen: soft  Extremities: no obvious acute joint deformity  Neuro: dilated pupils, + spontaneous respirations, no response to pain  GU: defer   Resolved Hospital Problem list     Assessment & Plan:   Acute encephalopathy Acute respiratory failure w hypercarbia and hypoxia  Multifocal PNA (?aspiration PNA vs CAP or viral PNA  Hemoptysis  Cardiac arrest Shock -- favor distributive (profound acidosis, possibly septic) +/- hypovolemic with possible n/v. Consider PE (has been sick for a week, some c/o hemoptysis)  Acute renal failure  Severe AGMA Acute liver failure Hyperbilirubinemia  Coagulopathy  Hypocalcemia  Hypoglycemia, improved  DNR discussion -- remains full code  -family says she was sick x 1 week. Maybe resp related, maybe GI.  P -will start NAC. Consider correcting INR  -check APAP level, salicylate  -Admit to ICU -avoid fevers  -cont MV support, VAP, PAD.  Is on a set rate of 32, keep. Recheck ABG at 8a  -send RVP , trach asp, UA  -empiric abx  -ECHO, EKG, trop, BLE [place]   -lines placed emergently; exchange when she is more stable  -IVF -NE for MAP > 65, low threshold to add vaso -bicarb gtt -LR boluses  -consult nephro this morning.  -consider GI consult.  -correct K, give mag -Ca being corrected  -  very guarded prognosis     Best Practice (right click and "Reselect all SmartList Selections" daily)   Diet/type: NPO DVT prophylaxis prophylactic heparin  Pressure ulcer(s): pressure ulcer assessment deferred  GI prophylaxis: PPI Lines: Central line, Arterial Line, and yes and it is still needed Foley:  Yes, and it is still needed Code Status:  full code Last date of multidisciplinary goals of care discussion [2/17 discussed with daughter at bedside. She is hysterical about the critical nature of her mom's illness. Every time I tried to talk about something she started screaming and crying. Think she will need someone to talk  with her again later today when she is more calm. Re code status, Remains full code, daughter wants everything that can be done ]  Labs   CBC: Recent Labs  Lab 05/12/2023 0437 05/04/2023 0454 05/05/2023 0455 05/15/2023 0517 05/20/2023 0635  WBC 6.8  --   --   --   --   NEUTROABS 5.2  --   --   --   --   HGB 12.1 12.2 12.6 10.9* 10.2*  HCT 39.4 36.0 37.0 32.0* 30.0*  MCV 106.2*  --   --   --   --   PLT 191  --   --   --   --     Basic Metabolic Panel: Recent Labs  Lab 05/29/2023 0437 05/09/2023 0454 05/23/2023 0455 05/02/2023 0517 05/28/2023 0635  NA 139 135 134* 137 137  K 3.5 3.4* 3.4* 3.6 4.1  CL 95*  --  99  --   --   CO2 <7*  --   --   --   --   GLUCOSE 398*  --  417*  --   --   BUN 41*  --  47*  --   --   CREATININE 5.45*  --  5.10*  --   --   CALCIUM 6.2*  --   --   --   --    GFR: Estimated Creatinine Clearance: 11 mL/min (A) (by C-G formula based on SCr of 5.1 mg/dL (H)). Recent Labs  Lab 05/22/2023 0437 05/17/2023 0452  WBC 6.8  --   LATICACIDVEN  --  14.0*    Liver Function Tests: Recent Labs  Lab 05/24/2023 0437  AST >7,000*  ALT 4,781*  ALKPHOS 183*  BILITOT 4.3*  PROT 5.9*  ALBUMIN 2.8*   No results for input(s): "LIPASE", "AMYLASE" in the last 168 hours. No results for input(s): "AMMONIA" in the last 168 hours.  ABG    Component Value Date/Time   PHART 6.817 (LL) 05/25/2023 0635   PCO2ART 51.6 (H) 05/22/2023 0635   PO2ART 403 (H) 05/09/2023 0635   HCO3 8.3 (L) 05/13/2023 0635   TCO2 10 (L) 05/02/2023 0635   ACIDBASEDEF 25.0 (H) 05/04/2023 0635   O2SAT 100 05/09/2023 0635     Coagulation Profile: Recent Labs  Lab 05/29/2023 0437  INR 4.0*    Cardiac Enzymes: No results for input(s): "CKTOTAL", "CKMB", "CKMBINDEX", "TROPONINI" in the last 168 hours.  HbA1C: Hgb A1c MFr Bld  Date/Time Value Ref Range Status  06/11/2022 10:12 AM 6.0 (H) 4.8 - 5.6 % Final    Comment:    (NOTE)         Prediabetes: 5.7 - 6.4         Diabetes: >6.4         Glycemic  control for adults with diabetes: <7.0   10/15/2020 11:25 AM 5.9 (H) 4.8 - 5.6 %  Final    Comment:             Prediabetes: 5.7 - 6.4          Diabetes: >6.4          Glycemic control for adults with diabetes: <7.0     CBG: Recent Labs  Lab 05/26/2023 0432 05/27/2023 0439  GLUCAP 24* 266*    Review of Systems:   Unable to obtain   Past Medical History:  She,  has a past medical history of Allergic rhinitis (10/03/2016), Arthritis, Asthma (2009), CLOSED FRACTURE OF METATARSAL BONE (12/29/2008), COSTOCHONDRITIS (09/04/2008), De Quervain's tenosynovitis, left (10/03/2016), DEGENERATIVE DISC DISEASE, CERVICAL SPINE (10/18/2007), DENTAL CARIES (02/04/2008), Depression, HERNIATED LUMBOSACRAL DISC (05/02/2008), History of kidney stones, Hyperlipidemia (2013), Hypertension (2009), Left hip pain (11/02/2013), Lumbosacral spondylosis without myelopathy (10/03/2011), Microscopic hematuria (04/22/2007), Mood changes (11/18/2018), NEPHROLITHIASIS (05/21/2007), Neuropathy, cervical (radicular) (02/03/2018), Obesity (BMI 30-39.9) (06/03/2013), Osteoarthritis of spine (02/09/2008), PAIN IN SOFT TISSUES OF LIMB (04/15/2007), Palpitations (11/18/2018), Primary hypertension (09/01/2013), Recurrent incisional hernia with incarceration s/p lap repair w mesh 06/24/2013 (05/21/2007), S/P hernia repair (06/24/2013), S/P lumbar spinal fusion (09/29/2014), Sciatica (10/03/2011), SUBACROMIAL BURSITIS, LEFT (10/30/2007), Tobacco use disorder (06/03/2013), TRANSAMINASES, SERUM, ELEVATED (02/19/2010), TRICHOMONIASIS (04/22/2007), Vaginal dryness (05/19/2016), and WRIST PAIN, BILATERAL (01/09/2010).   Surgical History:   Past Surgical History:  Procedure Laterality Date   ABDOMINAL HYSTERECTOMY  04/01/1987   still gets pap smears   BLADDER REPAIR  04/01/1987   ?bladder injury   DILATION AND CURETTAGE OF UTERUS     HERNIA REPAIR  1990s   reports total of 3 surgeries, most recently 2007   INSERTION OF MESH N/A  06/24/2013   Procedure: INSERTION OF MESH;  Surgeon: Ardeth Sportsman, MD;  Location: [facility];  Service: General;  Laterality: N/A;   MAXIMUM ACCESS (MAS)POSTERIOR LUMBAR INTERBODY FUSION (PLIF) 2 LEVEL N/A 09/29/2014   Procedure: FOR MAXIMUM ACCESS (MAS) POSTERIOR LUMBAR INTERBODY FUSION  LUMBAR FOUR-FIVE,LUMBAR FIVE-SACRAL ONE;  Surgeon: Tia Alert, MD;  Location: [facility];  Service: Neurosurgery;  Laterality: N/A;   MULTIPLE TOOTH EXTRACTIONS     SMALL INTESTINE SURGERY  04/01/1987   for "blockage"   TONSILLECTOMY  03/31/1965   TOTAL HIP ARTHROPLASTY Left 06/20/2022   Procedure: TOTAL HIP ARTHROPLASTY;  Surgeon: Joen Laura, MD;  Location: [facility];  Service: Orthopedics;  Laterality: Left;   TUBAL LIGATION  03/31/1977   UMBILICAL HERNIA REPAIR  04/01/2003   open w suture   VENTRAL HERNIA REPAIR  03/31/2005   open w mesh Dr Zachery Dakins   VENTRAL HERNIA REPAIR N/A 06/24/2013   Procedure: LAPAROSCOPIC VENTRAL WALL HERNIA REPAIR;  Surgeon: Ardeth Sportsman, MD;  Location: [facility];  Service: General;  Laterality: N/A;     Social History:   reports that she has been smoking cigarettes. She has a 25 pack-year smoking history. She has never used smokeless tobacco. She reports current alcohol use of about 3.0 standard drinks of alcohol per week. She reports that she does not use drugs.   Family History:  Her family history includes Alzheimer's disease in her maternal grandmother; Asthma in her brother; Diabetes in her brother; Early death in her father and mother; Heart murmur in her brother; Hypertension in her brother and brother.   Allergies Allergies  Allergen Reactions   Latex Itching and Swelling   Other Hives    Black Hair dye, hair loss   Banana Swelling     Home Medications  Prior to Admission medications  Medication Sig Start Date End Date Taking? Authorizing Provider  acetaminophen (TYLENOL) 500 MG tablet Take 2 tablets (1,000 mg total) by mouth every 6 (six) hours  as needed for moderate pain. 06/20/22   Cecil Cobbs, PA-C  amLODipine (NORVASC) 5 MG tablet Take 5 mg by mouth daily.    [provider]  atorvastatin (LIPITOR) 40 MG tablet TAKE 1 TABLET(40 MG) BY MOUTH DAILY 06/30/22   Idalia Needle, Turkey J, DO  fluticasone (FLONASE) 50 MCG/ACT nasal spray SHAKE LIQUID AND USE 2 SPRAYS IN [facility] NOSTRIL DAILY 02/05/22   Paige, Turkey J, DO  lisinopril (ZESTRIL) 40 MG tablet TAKE 1 TABLET(40 MG) BY MOUTH DAILY 03/19/22   Cora Collum, DO     Critical care time: 50 min      CRITICAL CARE Performed by: Lanier Clam   Total critical care time: 50 minutes  Critical care time was exclusive of separately billable procedures and treating other patients. Critical care was necessary to treat or prevent imminent or life-threatening deterioration.  Critical care was time spent personally by me on the following activities: development of treatment plan with patient and/or surrogate as well as nursing, discussions with consultants, evaluation of patient's response to treatment, examination of patient, obtaining history from patient or surrogate, ordering and performing treatments and interventions, ordering and review of laboratory studies, ordering and review of radiographic studies, pulse oximetry and re-evaluation of patient's condition.  Tessie Fass MSN, AGACNP-BC [facility] Pulmonary/Critical Care Medicine Amion for pager  05/24/2023, 6:52 AM

## 2023-05-30 NOTE — IPAL (Signed)
  Interdisciplinary Goals of Care Family Meeting   Date carried out: 05/07/2023  Location of the meeting: Conference room  Member's involved: Physician, Bedside Registered Nurse, and Family Member or next of kin  Durable Power of Attorney or acting medical decision maker: daughter and multiple family members.    Discussion: We discussed goals of care for Cynthia Hess .   Discussed nature of multiorgan failure, cardiac arrest, and grim prognosis. Plan is to get repeat set of labs and meet around 14:00 to see if (A) BP stable enough to try CRRT or (B) if any labs showing signs of improvement.   No changes to code status until we have better idea of trajectory.  Code status:   Code Status: Full Code   Disposition: Continue current acute care  Time spent for the meeting: 15 mins    Lorin Glass, MD  05/10/2023, 11:58 AM

## 2023-05-30 NOTE — Progress Notes (Signed)
 RT obtained ABG and reported results to MD @0914   Results as follows: 6.88/54.4/95/10.2 No new orders at this time.

## 2023-05-30 NOTE — Code Documentation (Addendum)
  Patient Name: Cynthia Hess   MRN: [id number]   Date of Birth/ Sex: 1955/08/23, female      Admission Date: 05/17/2023  Attending Provider: Lorin Glass, MD  Primary Diagnosis: Cardiac arrest [facility])   DELAYED ENTRY Indication: Pt was initially admitted for multiorgan failure and cardiac arrest and admitted to the ICU. At shift change, patient went into PEA arrest at 95. Code blue was subsequently called. At the time of arrival on scene, ACLS protocol was underway.   Technical Description:  - CPR performance duration:  6 minute  - Was defibrillation or cardioversion used? No   - Was external pacer placed? No  - Was patient intubated pre/post CPR? Yes   Medications Administered: Y = Yes; Blank = No Amiodarone  N  Atropine  N  Calcium  Y  Epinephrine  Y  Lidocaine  N  Magnesium  N  Norepinephrine (already running) Y  Phenylephrine  N  Sodium bicarbonate  Y  Vasopressin (already running) Y   Post CPR evaluation:  - Final Status - Was patient successfully resuscitated ? Yes - What is current rhythm? Sinus tachycardia - What is current hemodynamic status? Stable, critical condition  Miscellaneous Information:  - Labs sent, including: CMP, Mag, LA, ABG, CBG, CBC, phos, PT-INR  - Primary team notified?  Yes  - Family Notified? Yes  - Additional notes/ transfer status: See Dr. Elsie Stain note from 2/17 at 19:49     Evette Georges, MD  05/26/2023, 7:58 PM

## 2023-05-30 NOTE — Progress Notes (Addendum)
 Pharmacy Antibiotic Note  Cynthia Hess is a 68 y.o. female admitted on 05/08/2023 with sepsis with multi-organ failure. S/p cardiac arrest in ED. Pharmacy has been consulted for zosyn and vancomycin dosing.  Plan: Zosyn 2.25g q8h  Vancomycin dose per levels with unstable renal function  F/u plans for RRT and nephro recs F/u infectious work up and narrow as able  Height: 5\' 2"  (157.5 cm) Weight: 85 kg (187 lb 6.3 oz) IBW/kg (Calculated) : 50.1  No data recorded.  Recent Labs  Lab 05/11/2023 0437 05/22/2023 0452 05/26/2023 0455  WBC 6.8  --   --   CREATININE 5.45*  --  5.10*  LATICACIDVEN  --  14.0*  --     Estimated Creatinine Clearance: 11 mL/min (A) (by C-G formula based on SCr of 5.1 mg/dL (H)).    Allergies  Allergen Reactions   Latex Itching and Swelling   Other Hives    Black Hair dye, hair loss   Banana Swelling    Antimicrobials this admission: Cefepime 2/17  Vancomycin 2/17> Zosyn 2/17 >  Microbiology results: 2/17 bcx: 2/17 resp panel: 2/17 resp pcr:  2/17 resp cx:   Thank you for allowing pharmacy to be a part of this patient's care.  Marja Kays 05/28/2023 6:55 AM

## 2023-05-30 NOTE — ED Triage Notes (Signed)
 Pt arrived from home via [facility] on scene bp 70/40. Epi drip 50ml  35mcg/ml enroute. RA 80%. Tachnepic. CBG on scene 65.Pt minimally response to all stimuli

## 2023-05-30 NOTE — Consult Note (Signed)
 Nephrology Consult   Requesting provider: [name] Service requesting consult: CCM Reason for consult: Acute renal failure   Assessment/Recommendations: Cynthia RATHMANN is a/an 68 y.o. female with a past medical history HTN and HLD who present w/ septic shock, acute hypoxic respiratory failure, influenza infection, acute renal failure  Acute renal failure: Kidney function normal in March 2024.  Acute renal failure in the setting of septic shock causing ATN -Start CRRT -Continue to monitor daily Cr, Dose meds for GFR -Monitor Daily I/Os, Daily weight  -Maintain MAP>65 for optimal renal perfusion.  -Avoid nephrotoxic medications including NSAIDs -Use synthetic opioids (Fentanyl/Dilaudid) if needed -Continue as of care, prognosis guarded  Septic shock: Antibiotics and pressors per primary team and  Severe respiratory and anion gap metabolic acidosis: Ventilation per primary team.  CRRT as above.  Improve perfusion as able with pressors  Acute hypoxic/hypercarbic respiratory failure: Associated with flu.  Ventilatory management per primary team  Influenza infection/pneumonia: Management per primary team  Hypertension: Home blood pressure medications  NSTEMI: Elevated troponin.  Likely demand.  Management per primary team  Shock liver: Likely based on labs and clinical situation. shock management as above   Recommendations conveyed to primary service.    Darnell Level [facility] 05/24/2023 3:03 PM   _____________________________________________________________________________________ CC: sob  History of Present Illness: Cynthia Hess is a/an 68 y.o. female with a past medical history of HTN and HLD who presents with shortness of breath.  The patient was intubated and sedated so history was obtained per chart review.  The patient presented to the hospital this morning for evaluation of altered mental status and shortness of breath.  Patient's family called  EMS because over the past 5 days she had had worsening upper respiratory symptoms and poor p.o. intake.  The family had been worried about her and trying to get her to come to the hospital but she had been avoiding it.  Because of her symptoms she was bedbound and also they noted significant shortness of breath today.  Family also noted some vomiting.  When EMS arrived her sats were in the 70s and she was hypotensive and hypoglycemic.   In the emergency department the patient had a cardiac arrest and achieved ROSC after about 5 minutes.  Imaging demonstrated multifocal pneumonia.  Positive for influenza.  Hypotensive with severe acidosis and renal failure.   Medications:  Current Facility-Administered Medications  Medication Dose Route Frequency Provider Last Rate Last Admin   acetylcysteine (ACETADOTE) 18,000 mg in dextrose 5 % 590 mL (30.5085 mg/mL) infusion  6.25 mg/kg/hr Intravenous Continuous Bowser, Kaylyn Layer, NP       Chlorhexidine Gluconate Cloth 2 % PADS 6 each  6 each Topical Daily Lorin Glass, MD   6 each at 05/22/2023 0805   docusate (COLACE) 50 MG/5ML liquid 100 mg  100 mg Per Tube BID Kommor, Madison, MD   100 mg at 05/03/2023 0843   fentaNYL (SUBLIMAZE) injection 25 mcg  25 mcg Intravenous Q15 min PRN Kommor, Madison, MD       fentaNYL (SUBLIMAZE) injection 25-100 mcg  25-100 mcg Intravenous Q30 min PRN Kommor, Madison, MD       heparin injection 1,000-6,000 Units  1,000-6,000 Units CRRT PRN Darnell Level, MD       heparin injection 5,000 Units  5,000 Units Subcutaneous Q8H Bowser, Kaylyn Layer, NP   5,000 Units at 05/25/2023 1342   hydrocortisone sodium succinate (SOLU-CORTEF) 100 MG injection 100 mg  100 mg  Intravenous Q8H Lorin Glass, MD   100 mg at 05/21/2023 0932   insulin aspart (novoLOG) injection 0-9 Units  0-9 Units Subcutaneous Q4H Lorin Glass, MD       norepinephrine (LEVOPHED) 4mg  in (0.016 mg/mL) premix infusion  0-40 mcg/min Intravenous Continuous Lorin Glass, MD 45 mL/hr at 05/16/2023 1434 12 mcg/min at 05/07/2023 1434   ondansetron (ZOFRAN) injection 4 mg  4 mg Intravenous Q6H PRN Lanier Clam, NP       Oral care mouth rinse  15 mL Mouth Rinse Q2H Lorin Glass, MD   15 mL at 05/13/2023 1313   Oral care mouth rinse  15 mL Mouth Rinse PRN Lorin Glass, MD       oseltamivir (TAMIFLU) capsule 30 mg  30 mg Per Tube Daily Lorin Glass, MD   30 mg at 05/07/2023 1142   pantoprazole (PROTONIX) injection 40 mg  40 mg Intravenous QHS Lanier Clam, NP       piperacillin-tazobactam (ZOSYN) IVPB 2.25 g  2.25 g Intravenous Q8H Lorin Glass, MD 12.5 mL/hr at 05/02/2023 1400 Infusion Verify at 05/23/2023 1400   polyethylene glycol (MIRALAX / GLYCOLAX) packet 17 g  17 g Per Tube Daily Kommor, Madison, MD   17 g at 05/22/2023 0843   polyethylene glycol (MIRALAX / GLYCOLAX) packet 17 g  17 g Per Tube Daily PRN Bowser, Kaylyn Layer, NP       prismasol BGK 4/2.5 infusion   CRRT Continuous Darnell Level, MD       prismasol BGK 4/2.5 infusion   CRRT Continuous Darnell Level, MD       prismasol BGK 4/2.5 infusion   CRRT Continuous Darnell Level, MD       sodium bicarbonate 150 mEq in sterile water 1,150 mL infusion   Intravenous Continuous Kommor, Madison, MD 100 mL/hr at 05/21/2023 1400 Infusion Verify at 05/14/2023 1400   sodium chloride 0.9 % primer fluid for CRRT   CRRT PRN Darnell Level, MD       sodium chloride flush (NS) 0.9 % injection 10-40 mL  10-40 mL Intracatheter Q12H Lorin Glass, MD   10 mL at 05/06/2023 0848   sodium chloride flush (NS) 0.9 % injection 10-40 mL  10-40 mL Intracatheter PRN Lorin Glass, MD       vancomycin variable dose per unstable renal function (pharmacist dosing)   Does not apply See admin instructions Lorin Glass, MD       vasopressin (PITRESSIN) 20 Units in 100 mL (0.2 unit/mL) infusion-*FOR SHOCK*  0.04 Units/min Intravenous Continuous Lorin Glass, MD 12 mL/hr at 05/19/2023 1400 0.04 Units/min at 05/06/2023 1400      ALLERGIES Latex, Other, and Banana  MEDICAL HISTORY Past Medical History:  Diagnosis Date   Allergic rhinitis 10/03/2016   Arthritis    in low back   Asthma 2009   CLOSED FRACTURE OF METATARSAL BONE 12/29/2008   Annotation: minimally displaced fracture through the fifth metatarsal Qualifier: Diagnosis of  By: Daphine Deutscher FNP, [employer]     COSTOCHONDRITIS 09/04/2008   Qualifier: Diagnosis of  By: Daphine Deutscher FNP, Fernande Boyden Quervain's tenosynovitis, left 10/03/2016   DEGENERATIVE DISC DISEASE, CERVICAL SPINE 10/18/2007   Qualifier: Diagnosis of  By: Daphine Deutscher FNP, [facility]     DENTAL CARIES 02/04/2008   Qualifier: Diagnosis of  By: Daphine Deutscher FNP, Zena Amos     Depression    meds in the past/  denies 06/16/13   HERNIATED LUMBOSACRAL DISC 05/02/2008   Qualifier: Diagnosis of  By: Shannon, [place]     History of kidney stones    Hyperlipidemia 2013   Hypertension 2009   Left hip pain 11/02/2013   Lumbosacral spondylosis without myelopathy 10/03/2011   Microscopic hematuria 04/22/2007   Qualifier: Diagnosis of  By: [name]     Mood changes 11/18/2018   NEPHROLITHIASIS 05/21/2007   Annotation: bilateral Qualifier: Diagnosis of  By: Daphine Deutscher FNP, [employer]     Neuropathy, cervical (radicular) 02/03/2018   Obesity (BMI 30-39.9) 06/03/2013   Osteoarthritis of spine 02/09/2008   Qualifier: Diagnosis of  By: Daphine Deutscher FNP, [employer]     PAIN IN SOFT TISSUES OF LIMB 04/15/2007   Qualifier: Diagnosis of  By: Daphine Deutscher FNP, [employer]     Palpitations 11/18/2018   Primary hypertension 09/01/2013   Recurrent incisional hernia with incarceration s/p lap repair w mesh 06/24/2013 05/21/2007   Qualifier: Diagnosis of  By: Daphine Deutscher FNP, [employer]     S/P hernia repair 06/24/2013   S/P lumbar spinal fusion 09/29/2014   Sciatica 10/03/2011   SUBACROMIAL BURSITIS, LEFT 10/30/2007   Annotation: per MRI Qualifier: Diagnosis of  By: Daphine Deutscher FNP, [employer]     Tobacco use disorder 06/03/2013   TRANSAMINASES, SERUM,  ELEVATED 02/19/2010   Qualifier: Diagnosis of  By: Daphine Deutscher FNP, Zena Amos     TRICHOMONIASIS 04/22/2007   Qualifier: History of  By: Daphine Deutscher FNP, [employer]     Vaginal dryness 05/19/2016   WRIST PAIN, BILATERAL 01/09/2010   Qualifier: Diagnosis of  By: Daphine Deutscher FNP, [employer]       SOCIAL HISTORY Social History   Socioeconomic History   Marital status: Single    Spouse name: Not on file   Number of children: 4   Years of education: 11   Highest education level: 11th grade  Occupational History   Not on file  Tobacco Use   Smoking status: Some Days    Current packs/day: 1.00    Average packs/day: 1 pack/day for 25.0 years (25.0 ttl pk-yrs)    Types: Cigarettes   Smokeless tobacco: Never  Vaping Use   Vaping status: Never Used  Substance and Sexual Activity   Alcohol use: Yes    Alcohol/week: 3.0 standard drinks of alcohol    Types: 3 Cans of beer per week    Comment: occasional   Drug use: Never   Sexual activity: Not Currently    Partners: Male    Birth control/protection: Surgical  Other Topics Concern   Not on file  Social History Narrative   Previous MD was Dr. Philipp Deputy, last seen 6/2013Has notes from Dr. Wynn Banker (chronic pain mgmt)- non-narcotic txt only per most recent note 09/2011 due to UDS + for codeine, butalbital, ethanolCHRONIC BACK PAIN PATIENTIs currently unemployed, finished some high school On disability for her back- had a fall in 2010 Lives with daughter, Ilayda Toda cigarettes and EtOH, denies drugsCurrent meds:Lisinopril 40 qdClonidine 0.1mg  qhs (restarting 08/2013)         09/25/2020   Patient lives alone in [place].    Patient has 4 children who all live in the area. Patient is close with them and sees them often.    Patient enjoys spending time with family and watching TV.   Due to back problems it is hard for patient to exercise, she tries to walk when she can.    Social Drivers of Health   Financial Resource Strain: Low Risk  (09/25/2020)  Overall Financial Resource Strain (CARDIA)    Difficulty of Paying Living Expenses: Not hard at all  Food Insecurity: No Food Insecurity (06/20/2022)   Hunger Vital Sign    Worried About Running Out of Food in the Last Year: Never true    Ran Out of Food in the Last Year: Never true  Transportation Needs: No Transportation Needs (06/20/2022)   PRAPARE - Administrator, Civil Service (Medical): No    Lack of Transportation (Non-Medical): No  Physical Activity: Inactive (09/25/2020)   Exercise Vital Sign    Days of Exercise per Week: 0 days    Minutes of Exercise per Session: 0 min  Stress: Stress Concern Present (09/25/2020)   [employer] of Occupational Health - Occupational Stress Questionnaire    Feeling of Stress : Rather much  Social Connections: Socially Isolated (09/25/2020)   Social Connection and Isolation Panel [NHANES]    Frequency of Communication with Friends and Family: More than three times a week    Frequency of Social Gatherings with Friends and Family: More than three times a week    Attends Religious Services: Never    Database administrator or Organizations: No    Attends Banker Meetings: Never    Marital Status: Divorced  Catering manager Violence: Not At Risk (06/20/2022)   Humiliation, Afraid, Rape, and Kick questionnaire    Fear of Current or Ex-Partner: No    Emotionally Abused: No    Physically Abused: No    Sexually Abused: No     FAMILY HISTORY Family History  Problem Relation Age of Onset   Early death Mother        when pt was 76; poisoned   Early death Father        when pt was 71, unknown    Asthma Brother    Diabetes Brother    Heart murmur Brother    Hypertension Brother    Alzheimer's disease Maternal Grandmother    Hypertension Brother       Review of Systems: Unable to obtain due to the patient's sedation/altered mental status  Physical Exam: Vitals:   05/02/2023 1330 05/24/2023 1400  BP:  (!) 75/44   Pulse: 79 80  Resp: (!) 32 (!) 32  Temp:    SpO2: 95% 95%   Total I/O In: 3026.1 [I.V.:1364.6; IV Piggyback:1661.6] Out: -   Intake/Output Summary (Last 24 hours) at 05/14/2023 1503 Last data filed at 05/03/2023 1400 Gross per 24 hour  Intake 4026.13 ml  Output --  Net 4026.13 ml   General: Ill-appearing, sedated, ventilated HEENT: anicteric sclera, oropharynx clear without lesions CV: Normal rate, no audible rub, no pitting edema in ankles Lungs: Coarse bilateral breath sounds, bilateral chest rise, ventilated Abd: soft, non-tender, non-distended Skin: no visible lesions or rashes Psych: Sedated, not interactive Musculoskeletal: no obvious deformities Neuro: Sedated, does not respond to pain or voice  Test Results Reviewed Lab Results  Component Value Date   NA 142 05/16/2023   K 4.2 05/19/2023   CL 93 (L) 05/16/2023   CO2 12 (L) 05/21/2023   BUN 41 (H) 05/19/2023   CREATININE 5.80 (H) 05/09/2023   CALCIUM 6.6 (L) 05/08/2023   ALBUMIN 2.0 (L) 05/24/2023    CBC Recent Labs  Lab 05/26/2023 0437 05/21/2023 0454 05/28/2023 0635 05/02/2023 0914 05/28/2023 1315  WBC 6.8  --   --   --  QUESTIONABLE RESULTS, RECOMMEND RECOLLECT TO VERIFY  NEUTROABS 5.2  --   --   --  QUESTIONABLE RESULTS, RECOMMEND RECOLLECT TO VERIFY  HGB 12.1   < > 10.2* 10.2* 10.8*  HCT 39.4   < > 30.0* 30.0* 36.1  MCV 106.2*  --   --   --  107.1*  PLT 191  --   --   --  145*   < > = values in this interval not displayed.    I have reviewed all relevant outside healthcare records related to the patient's current hospitalization   The patient is critically ill with optic shock, acute renal failure, severe acidosis, acute hypoxic respiratory failure and which includes my role to primarily manage acute renal failure.  This requires high complexity decision making.  Total critical care time: 52 minutes  Critical care time was exclusive of treating other patients.   Critical care was necessary to treat  or prevent imminent or life-threatening deterioration.   Critical care was time spent personally by me on the following activities:   development of treatment plan with patient and/or surrogate as well as nursing,   discussions with other provider evaluation of patient's response to treatment  examination of patient  obtaining history from patient or surrogate  ordering and performing treatments and interventions  ordering and review of laboratory studies  ordering and review of radiographic studies

## 2023-05-30 NOTE — Progress Notes (Signed)
 ED Pharmacy Antibiotic Sign Off An antibiotic consult was received from an ED provider for cefepime and vancomycin per pharmacy dosing for sepsis. A chart review was completed to assess appropriateness.   The following one time order(s) were placed:  Cefepime 2g Vancomycin 1500mg   Further antibiotic and/or antibiotic pharmacy consults should be ordered by the admitting provider if indicated.   Thank you for allowing pharmacy to be a part of this patient's care.   Marja Kays, [facility]  Clinical Pharmacist 05/24/2023 5:45 AM

## 2023-05-30 NOTE — Progress Notes (Signed)
 Venous duplex lower ext  has been completed. Refer to [facility] under chart review to view preliminary results.   05/28/2023  10:15 AM Cynthia Hess, Gerarda Gunther

## 2023-05-30 NOTE — Progress Notes (Addendum)
 eLink Physician-Brief Progress Note Patient Name: Cynthia Hess DOB: 1955/05/20 MRN: [id number]   Date of Service  05/15/2023  HPI/Events of Note  Called to the room as the patient was being coded. Pt was undergoing CRRT and patient went into PEA arrest.   CPR was initiated, pt received epinephrine, bicarb and calcium gluconate with ROSC.   BP 128/90,HR 150s, RR 26.  eICU Interventions  Providers at the bedside.  Continue levophed. Hold CRRT.  Check BMP, Mg, phos, lactate, troponin, ABG.      Intervention Category Major Interventions: Code management / supervision  Larinda Buttery 05/04/2023, 7:25 PM  8:53 PM Notified of bradycardia, and was likely about to code.  Code status was changed earlier to DNR. No intervention done and patient passed away. Several family members present at the bedside.

## 2023-05-30 NOTE — Progress Notes (Signed)
 Mechanical ventilation has been discontinued due to patient expired.

## 2023-05-30 NOTE — Progress Notes (Signed)
   05/27/2023 0900  Spiritual Encounters  Type of Visit Follow up  Care provided to: Pt and family  Conversation partners present during encounter Other (comment)  Referral source Chaplain team  Reason for visit Routine spiritual support  OnCall Visit No  Spiritual Framework  Presenting Themes Values and beliefs;Significant life change;Coping tools  Community/Connection Family  Family Stress Factors Health changes  Interventions  Spiritual Care Interventions Made Established relationship of care and support;Compassionate presence;Reflective listening;Normalization of emotions  Intervention Outcomes  Outcomes Connection to spiritual care;Connection to values and goals of care;Awareness around self/spiritual resourses;Awareness of support;Patient family open to resources  Spiritual Care Plan  Spiritual Care Issues Still Outstanding Cynthia Hess will continue to follow   Chaplain was present for the family support. The family is in deep sorrow due to the pt's health condition. Chaplain provided emotional support and lead the prayer.  Chaplain will continue to follow.   Lindell Spar Chaplain Resident [phone]

## 2023-05-30 DEATH — deceased

## 2023-06-23 MED FILL — Medication: Qty: 1 | Status: AC
